# Patient Record
Sex: Female | Born: 1975 | Race: Black or African American | Hispanic: No | Marital: Married | Smoking: Never smoker
Health system: Southern US, Community
[De-identification: ages and names within clinical notes are randomized; demographics above are authoritative.]

## PROBLEM LIST (undated history)

## (undated) DIAGNOSIS — I10 Essential (primary) hypertension: Secondary | ICD-10-CM

## (undated) DIAGNOSIS — M1990 Unspecified osteoarthritis, unspecified site: Secondary | ICD-10-CM

## (undated) HISTORY — PX: TUBAL LIGATION: SHX77

## (undated) HISTORY — PX: ABDOMINAL HYSTERECTOMY: SHX81

## (undated) HISTORY — DX: Morbid (severe) obesity due to excess calories: E66.01

---

## 2003-04-27 ENCOUNTER — Emergency Department (HOSPITAL_COMMUNITY): Admission: EM | Admit: 2003-04-27 | Discharge: 2003-04-28 | Payer: Self-pay | Admitting: Emergency Medicine

## 2006-04-17 ENCOUNTER — Encounter: Admission: RE | Admit: 2006-04-17 | Discharge: 2006-04-17 | Payer: Self-pay | Admitting: Internal Medicine

## 2010-11-12 ENCOUNTER — Emergency Department (HOSPITAL_COMMUNITY)
Admission: EM | Admit: 2010-11-12 | Discharge: 2010-11-12 | Disposition: A | Discharge status: Home / Self Care | Payer: Managed Care, Other (non HMO) | Attending: Emergency Medicine | Admitting: Emergency Medicine

## 2010-11-12 DIAGNOSIS — M65839 Other synovitis and tenosynovitis, unspecified forearm: Secondary | ICD-10-CM | POA: Insufficient documentation

## 2010-11-12 DIAGNOSIS — M25439 Effusion, unspecified wrist: Secondary | ICD-10-CM | POA: Insufficient documentation

## 2010-11-12 DIAGNOSIS — M65849 Other synovitis and tenosynovitis, unspecified hand: Secondary | ICD-10-CM | POA: Insufficient documentation

## 2010-11-12 DIAGNOSIS — M79609 Pain in unspecified limb: Secondary | ICD-10-CM | POA: Insufficient documentation

## 2010-11-12 DIAGNOSIS — M25539 Pain in unspecified wrist: Secondary | ICD-10-CM | POA: Insufficient documentation

## 2010-11-12 DIAGNOSIS — M7989 Other specified soft tissue disorders: Secondary | ICD-10-CM | POA: Insufficient documentation

## 2011-03-16 ENCOUNTER — Emergency Department (HOSPITAL_COMMUNITY)
Admission: EM | Admit: 2011-03-16 | Discharge: 2011-03-17 | Disposition: A | Discharge status: Home / Self Care | Payer: Managed Care, Other (non HMO) | Attending: Emergency Medicine | Admitting: Emergency Medicine

## 2011-03-16 ENCOUNTER — Encounter (HOSPITAL_COMMUNITY): Payer: Self-pay | Admitting: *Deleted

## 2011-03-16 DIAGNOSIS — R059 Cough, unspecified: Secondary | ICD-10-CM | POA: Insufficient documentation

## 2011-03-16 DIAGNOSIS — J029 Acute pharyngitis, unspecified: Secondary | ICD-10-CM

## 2011-03-16 DIAGNOSIS — R05 Cough: Secondary | ICD-10-CM | POA: Insufficient documentation

## 2011-03-16 NOTE — ED Notes (Signed)
sorethroat for 3 days with chills

## 2011-03-17 MED ORDER — PREDNISONE 20 MG PO TABS
60.0000 mg | ORAL_TABLET | Freq: Once | ORAL | Status: AC
  Administered 2011-03-17: 60 mg via ORAL
  Filled 2011-03-17: qty 3

## 2011-03-17 NOTE — ED Notes (Signed)
C/O sore throat x 3 days.

## 2011-03-17 NOTE — ED Provider Notes (Signed)
History     CSN: [id number]  Arrival date & time 03/16/11  2337   First MD Initiated Contact with Patient 03/17/11 0201      Chief Complaint  Patient presents with  . Sore Throat     Patient is a 36 y.o. female presenting with pharyngitis. The history is provided by the patient.  Sore Throat This is a new problem. The current episode started more than 2 days ago. The problem occurs constantly. The problem has been gradually worsening. Pertinent negatives include no chest pain and no shortness of breath. The symptoms are aggravated by swallowing. The symptoms are relieved by nothing.  pt with sore throat/hoarse voice for 2-3 days Also reports recent non-productive cough She is able to swallow liquids No SOB reported  PMH - none  History reviewed. No pertinent past surgical history.  History reviewed. No pertinent family history.  History  Substance Use Topics  . Smoking status: Never Smoker   . Smokeless tobacco: Not on file  . Alcohol Use: Yes    OB History    Grav Para Term Preterm Abortions TAB SAB Ect Mult Living                  Review of Systems  Respiratory: Negative for shortness of breath.   Cardiovascular: Negative for chest pain.    Allergies  Review of patient's allergies indicates no known allergies.  Home Medications   Current Outpatient Rx  Name Route Sig Dispense Refill  . PHENTERMINE HCL 15 MG PO CAPS Oral Take 15 mg by mouth at bedtime.      BP 113/78  Pulse 68  Temp(Src) 97.6 F (36.4 C) (Oral)  Resp 20  SpO2 98%  LMP 03/13/2011  Physical Exam CONSTITUTIONAL: Well developed/well nourished HEAD AND FACE: Normocephalic/atraumatic EYES: EOMI/PERRL ENMT: Mucous membranes moist, uvula midline, no exudates noted.  Erythema noted to oropharynx No cervical lymphadenopathy.  Raspy voice noted.  No stridor noted NECK: supple no meningeal signs SPINE:entire spine nontender CV: S1/S2 noted, no murmurs/rubs/gallops noted LUNGS: Lungs are  clear to auscultation bilaterally, no apparent distress ABDOMEN: soft, nontender, no rebound or guarding GU:no cva tenderness NEURO: Pt is awake/alert, moves all extremitiesx4 EXTREMITIES: pulses normal, full ROM SKIN: warm, color normal PSYCH: no abnormalities of mood noted  ED Course  Procedures    Labs Reviewed  RAPID STREP SCREEN     1. Pharyngitis    Pt well appearing, no distress, stable for d/c She does not want work note or pain meds The patient appears reasonably screened and/or stabilized for discharge and I doubt any other medical condition or other [facility] requiring further screening, evaluation, or treatment in the ED at this time prior to discharge.    MDM  Nursing notes reviewed and considered in documentation All labs/vitals reviewed and considered         Joya Gaskins, MD 03/17/11 0330

## 2011-04-07 ENCOUNTER — Ambulatory Visit (INDEPENDENT_AMBULATORY_CARE_PROVIDER_SITE_OTHER): Payer: Managed Care, Other (non HMO) | Admitting: General Surgery

## 2011-04-27 ENCOUNTER — Ambulatory Visit (INDEPENDENT_AMBULATORY_CARE_PROVIDER_SITE_OTHER): Payer: Managed Care, Other (non HMO) | Admitting: General Surgery

## 2011-04-27 ENCOUNTER — Encounter (INDEPENDENT_AMBULATORY_CARE_PROVIDER_SITE_OTHER): Payer: Self-pay | Admitting: General Surgery

## 2011-04-27 VITALS — BP 112/70 | HR 76 | Temp 97.7°F | Resp 16 | Ht 62.0 in | Wt 262.2 lb

## 2011-04-27 DIAGNOSIS — E66813 Obesity, class 3: Secondary | ICD-10-CM

## 2011-04-27 DIAGNOSIS — E6609 Other obesity due to excess calories: Secondary | ICD-10-CM | POA: Insufficient documentation

## 2011-04-27 HISTORY — DX: Morbid (severe) obesity due to excess calories: E66.01

## 2011-04-27 HISTORY — DX: Obesity, class 3: E66.813

## 2011-04-27 LAB — COMPREHENSIVE METABOLIC PANEL
ALT: 18 U/L (ref 0–35)
Albumin: 4.2 g/dL (ref 3.5–5.2)
CO2: 25 mEq/L (ref 19–32)
Calcium: 9.3 mg/dL (ref 8.4–10.5)
Chloride: 108 mEq/L (ref 96–112)
Creat: 0.68 mg/dL (ref 0.50–1.10)
Potassium: 4.4 mEq/L (ref 3.5–5.3)
Sodium: 140 mEq/L (ref 135–145)
Total Protein: 7.2 g/dL (ref 6.0–8.3)

## 2011-04-27 LAB — TSH: TSH: 0.992 u[IU]/mL (ref 0.350–4.500)

## 2011-04-27 LAB — LIPID PANEL
LDL Cholesterol: 60 mg/dL (ref 0–99)
VLDL: 7 mg/dL (ref 0–40)

## 2011-04-27 LAB — T4: T4, Total: 10.1 ug/dL (ref 5.0–12.5)

## 2011-04-27 NOTE — Progress Notes (Signed)
Patient ID: Janice Terrell, female   DOB: [birth year], 36 y.o.   MRN: [id number]  Chief Complaint  Patient presents with  . Weight Loss Surgery    HPI Janice Terrell is a 36 y.o. female.  HPI A 36 year old morbidly obese African American female referred by Dr Concepcion Elk  to discuss weight loss surgery. The patient is specifically interested in laparoscopic adjustable gastric band surgery. She likes the fact that it is reversible if necessary. She states that she has drug of all her adult life with her weight. Despite several different attempts for sustained weight loss she has been unsuccessful. She has done phentermine on 2 separate occasions. She is currently on phentermine now. She has lost 11 pounds most recently. She was on it several years ago and lost 30 pounds but subsequently regained it. She has also been placed on a low calorie diet in the past.   Past Medical History  Diagnosis Date  . Morbid obesity   . Obesity, Class III, BMI 40-49.9 (morbid obesity) 04/27/2011    Past Surgical History  Procedure Date  . Cesarean section 1998    No family history on file.  Social History History  Substance Use Topics  . Smoking status: Never Smoker   . Smokeless tobacco: Not on file  . Alcohol Use: Yes    No Known Allergies  Current Outpatient Prescriptions  Medication Sig Dispense Refill  . phentermine 15 MG capsule Take 15 mg by mouth at bedtime.        Review of Systems Review of Systems  Constitutional: Negative for fever, chills and unexpected weight change.  HENT: Negative for hearing loss, congestion, sore throat, trouble swallowing and voice change.   Eyes: Negative for visual disturbance.  Respiratory: Negative for cough and wheezing.        Some DOE after 2 flights of stairs  Cardiovascular: Negative for chest pain, palpitations and leg swelling.  Gastrointestinal: Negative for nausea, vomiting, abdominal pain, diarrhea, constipation, blood in stool, abdominal  distention and anal bleeding.  Genitourinary: Negative for hematuria, vaginal bleeding and difficulty urinating.       G2P2  Musculoskeletal: Negative for back pain, joint swelling and arthralgias.       +rt knee pain for 6 months- took an xray  Skin: Negative for rash and wound.  Neurological: Negative for seizures, syncope and headaches.  Hematological: Negative for adenopathy. Does not bruise/bleed easily.  Psychiatric/Behavioral: Negative for confusion.    Blood pressure 112/70, pulse 76, temperature 97.7 F (36.5 C), temperature source Temporal, resp. rate 16, height 5\' 2"  (1.575 m), weight 262 lb 4 oz (118.956 kg).  Physical Exam Physical Exam  Vitals reviewed. Constitutional: She is oriented to person, place, and time. She appears well-developed and well-nourished. No distress.       obese  HENT:  Head: Normocephalic and atraumatic.  Right Ear: External ear normal.  Left Ear: External ear normal.  Eyes: Conjunctivae are normal. No scleral icterus.  Neck: Normal range of motion. Neck supple. No JVD present. No tracheal deviation present. No thyromegaly present.  Cardiovascular: Normal rate, regular rhythm and normal heart sounds.   Pulmonary/Chest: Effort normal and breath sounds normal. No respiratory distress. She has no wheezes.  Abdominal: Soft. She exhibits no distension. There is no tenderness. There is no rebound.    Musculoskeletal: Normal range of motion. She exhibits no edema and no tenderness.  Lymphadenopathy:    She has no cervical adenopathy.  Neurological: She is alert and oriented  to person, place, and time. No cranial nerve deficit. She exhibits normal muscle tone.  Skin: Skin is warm and dry. No rash noted. She is not diaphoretic. No erythema.  Psychiatric: She has a normal mood and affect. Her behavior is normal. Thought content normal.    Data Reviewed Sleep questionaire  Assessment    Morbid obesity BMI 48 Right knee pain    Plan    She  meets weight loss surgery criteria based on her BMI.   We discussed laparoscopic adjustable gastric banding. The patient was given Agricultural engineer. We discussed the risk and benefits of surgery including but not limited to bleeding, infection, injury to surrounding structures, blood clot formation such as deep venous thrombosis or pulmonary embolism, need to convert to an open procedure, band slippage, band erosion, failure to loose weight, port complications (leak or flippage), potential need for reoperative surgery, esophageal dilatation, worsening reflux, and vitamin deficiencies. We discussed the typical post operative recovery course. We discussed that their postoperative diet will be modified for several weeks. We specifically talked about the need to be on a liquid diet for one to 2 weeks after surgery. We also discussed the typical postoperative course with a laparoscopic adjustable gastric band and the need for frequent postoperative visits to assess the volume status of the band.  We discussed the typical expected weight loss with a laparoscopic adjustable gastric band. I explained to the patient that they can expect to lose 35-60% of their excess body weight if they are compliant with their postoperative instructions. However I did explain that some patients loose less than 40% and some patients lose more than 60% of their excess body weight.  I explained that the likelihood of improvement in their obesity is good.  We will start our evaluation process,.  Mary Sella. Andrey Campanile, MD, FACS General, Bariatric, & Minimally Invasive Surgery [facility], [place]         [facility] M 04/27/2011, 5:15 PM

## 2011-04-28 LAB — CBC WITH DIFFERENTIAL/PLATELET
Eosinophils Relative: 2 % (ref 0–5)
Lymphocytes Relative: 39 % (ref 12–46)
Lymphs Abs: 3.1 10*3/uL (ref 0.7–4.0)
MCV: 81.4 fL (ref 78.0–100.0)
Neutro Abs: 4.1 10*3/uL (ref 1.7–7.7)
Platelets: 402 10*3/uL — ABNORMAL HIGH (ref 150–400)
RBC: 4.19 MIL/uL (ref 3.87–5.11)
WBC: 8 10*3/uL (ref 4.0–10.5)

## 2011-04-28 LAB — H. PYLORI ANTIBODY, IGG: H Pylori IgG: >8 {ISR} — ABNORMAL HIGH

## 2011-05-05 ENCOUNTER — Telehealth (INDEPENDENT_AMBULATORY_CARE_PROVIDER_SITE_OTHER): Payer: Self-pay | Admitting: General Surgery

## 2011-05-05 DIAGNOSIS — D649 Anemia, unspecified: Secondary | ICD-10-CM

## 2011-05-05 NOTE — Telephone Encounter (Signed)
05/05/11 spoke with pt. Adv patient of breath-tek appointment scheduled on 05/19/11 @ 8:15 am @ [facility].  Adv pt that Dr Andrey Campanile requested a breath-tek to be scheduled due to the h.pylori blood test being elevated on her lab work.   cef

## 2011-05-05 NOTE — Telephone Encounter (Signed)
Patient contacted, made aware her hemoglobin was low and we would be getting an IBC panel on patient. She will go to lab when she is available to do so. Lab order placed and faxed to [facility].

## 2011-05-05 NOTE — Telephone Encounter (Signed)
Message copied by Liliana Cline on Fri May 05, 2011  9:33 AM ------      Message from: Andrey Campanile, ERIC M      Created: Thu May 04, 2011  2:31 PM       Pt needs a h pylori breath test and an anemia panel (iron, ferritin, folate, B-12, %sat)

## 2011-05-06 ENCOUNTER — Encounter: Payer: Self-pay | Admitting: *Deleted

## 2011-05-06 ENCOUNTER — Encounter: Payer: Managed Care, Other (non HMO) | Attending: General Surgery | Admitting: *Deleted

## 2011-05-06 DIAGNOSIS — Z01818 Encounter for other preprocedural examination: Secondary | ICD-10-CM | POA: Insufficient documentation

## 2011-05-06 DIAGNOSIS — Z713 Dietary counseling and surveillance: Secondary | ICD-10-CM | POA: Insufficient documentation

## 2011-05-06 NOTE — Patient Instructions (Signed)
   Follow Pre-Op Nutrition Goals to prepare for Lapband Surgery.   Call the [facility] at [phone] once you have been given your surgery date to enrolled in the Pre-Op Nutrition Class. You will need to attend this nutrition class 3-4 weeks prior to your surgery. 

## 2011-05-06 NOTE — Progress Notes (Addendum)
  Pre-Op Assessment Visit:  Pre-Operative LAGB Surgery  Medical Nutrition Therapy:  Appt start time: 1600   End time: 1700.  Patient was seen on 05/06/2011 for Pre-Operative LAGB Nutrition Assessment. Assessment and letter of approval faxed to [facility] Bariatric Surgery Program coordinator on 05/08/11.  Approval letter sent to [facility] center and will be available in the chart under the media tab.  TANITA  BODY COMP RESULTS  05/06/11     %Fat 51.2%     FM (lbs) 133.5     FFM (lbs) 127.5     TBW (lbs) 93.5      Handouts given during visit include:  Pre-Op Goals   Bariatric Support Group Calendar  B.E.L.T. Program Flyer  Patient to call for Pre-Op and Post-Op Nutrition Education at the [facility] when surgery is scheduled.

## 2011-05-19 ENCOUNTER — Encounter (HOSPITAL_COMMUNITY)
Admission: RE | Disposition: A | Discharge status: Home / Self Care | Payer: Self-pay | Source: Ambulatory Visit | Attending: General Surgery

## 2011-05-19 ENCOUNTER — Ambulatory Visit (HOSPITAL_COMMUNITY)
Admission: RE | Admit: 2011-05-19 | Discharge: 2011-05-19 | Disposition: A | Discharge status: Home / Self Care | Payer: Managed Care, Other (non HMO) | Source: Ambulatory Visit | Attending: General Surgery | Admitting: General Surgery

## 2011-05-19 DIAGNOSIS — Z01818 Encounter for other preprocedural examination: Secondary | ICD-10-CM | POA: Insufficient documentation

## 2011-05-19 HISTORY — PX: BREATH TEK H PYLORI: SHX5422

## 2011-05-19 SURGERY — BREATH TEST, FOR HELICOBACTER PYLORI

## 2011-05-22 ENCOUNTER — Encounter (HOSPITAL_COMMUNITY): Payer: Self-pay | Admitting: Surgery

## 2011-05-29 ENCOUNTER — Ambulatory Visit (HOSPITAL_COMMUNITY): Admission: RE | Admit: 2011-05-29 | Payer: Managed Care, Other (non HMO) | Source: Ambulatory Visit

## 2011-05-29 ENCOUNTER — Ambulatory Visit (HOSPITAL_COMMUNITY): Payer: Managed Care, Other (non HMO) | Attending: General Surgery

## 2012-10-04 ENCOUNTER — Emergency Department (INDEPENDENT_AMBULATORY_CARE_PROVIDER_SITE_OTHER)
Admission: EM | Admit: 2012-10-04 | Discharge: 2012-10-04 | Disposition: A | Discharge status: Home / Self Care | Payer: Managed Care, Other (non HMO) | Source: Home / Self Care | Attending: Emergency Medicine | Admitting: Emergency Medicine

## 2012-10-04 ENCOUNTER — Encounter (HOSPITAL_COMMUNITY): Payer: Self-pay | Admitting: Emergency Medicine

## 2012-10-04 DIAGNOSIS — M722 Plantar fascial fibromatosis: Secondary | ICD-10-CM

## 2012-10-04 MED ORDER — MELOXICAM 15 MG PO TABS: 15.0000 mg | ORAL_TABLET | Freq: Every day | ORAL | Status: DC

## 2012-10-04 NOTE — ED Provider Notes (Signed)
Chief Complaint:   Chief Complaint  Patient presents with  . Foot Pain    History of Present Illness:   Janice Terrell is a 37 year old female who has had a two-week history of pain in the plantar surface of her left heel. She denies any injury. It hurts first thing in the morning when she first gets up and after prolonged immobilization. Seems to get better with walking or stretching. There is no numbness or tingling.  Review of Systems:  Other than noted above, the patient denies any of the following symptoms: Systemic:  No fevers, chills, or sweats.  No fatigue or tiredness. Musculoskeletal:  No joint pain, arthritis, bursitis, swelling, or back pain.  Neurological:  No muscular weakness, paresthesias.  PMFSH:  Past medical history, family history, social history, meds, and allergies were reviewed.  No history of gout.   Has a history of morbid obesity.  Physical Exam:   Vital signs:  BP 129/79  Pulse 77  Temp(Src) 98.9 F (37.2 C) (Oral)  Resp 20  SpO2 98% Gen:  Alert and oriented times 3.  In no distress. Musculoskeletal:  Exam of the foot reveals there is pain to palpation over insertion of the plantar fascia. No swelling. Also some pain to palpation in the arch of the foot. No pain to palpation of the ankle or dorsum of the foot.  Otherwise, all joints had a full a ROM with no swelling, bruising or deformity.  No edema, pulses full. Extremities were warm and pink.  Capillary refill was brisk.  Skin:  Clear, warm and dry.  No rash. Neuro:  Alert and oriented times 3.  Muscle strength was normal.  Sensation was intact to light touch.   Assessment:  The encounter diagnosis was Plantar fasciitis.  Plan:   1.  Meds:  The following meds were prescribed:   Discharge Medication List as of 10/04/2012  2:04 PM    START taking these medications   Details  meloxicam (MOBIC) 15 MG tablet Take 1 tablet (15 mg total) by mouth daily., Starting 10/04/2012, Until Discontinued, Normal         2.  Patient Education/Counseling:  The patient was given appropriate handouts, self care instructions, and instructed in symptomatic relief including rest and activity, elevation, application of ice and compression.  Suggested stretching exercises, ice, weight loss, shoe insert, and if no better, followup with podiatry.  3.  Follow up:  The patient was told to follow up if no better in 3 to 4 days, if becoming worse in any way, and given some red flag symptoms such as worsening pain which would prompt immediate return.  Follow up with Dr. Cristie Hem as needed.       Reuben Likes, MD 10/04/12 [phone]

## 2012-10-04 NOTE — ED Notes (Signed)
Foot pain, seen by MD only

## 2013-11-09 ENCOUNTER — Emergency Department (HOSPITAL_COMMUNITY): Payer: Managed Care, Other (non HMO)

## 2013-11-09 ENCOUNTER — Emergency Department (HOSPITAL_COMMUNITY)
Admission: EM | Admit: 2013-11-09 | Discharge: 2013-11-09 | Disposition: A | Discharge status: Home / Self Care | Payer: Managed Care, Other (non HMO) | Attending: Emergency Medicine | Admitting: Emergency Medicine

## 2013-11-09 ENCOUNTER — Encounter (HOSPITAL_COMMUNITY): Payer: Self-pay | Admitting: Emergency Medicine

## 2013-11-09 DIAGNOSIS — M545 Low back pain, unspecified: Secondary | ICD-10-CM

## 2013-11-09 DIAGNOSIS — Z3202 Encounter for pregnancy test, result negative: Secondary | ICD-10-CM | POA: Diagnosis not present

## 2013-11-09 DIAGNOSIS — R0600 Dyspnea, unspecified: Secondary | ICD-10-CM | POA: Diagnosis not present

## 2013-11-09 LAB — CBC
HCT: 32.7 % — ABNORMAL LOW (ref 36.0–46.0)
Hemoglobin: 10.3 g/dL — ABNORMAL LOW (ref 12.0–15.0)
MCH: 24.1 pg — ABNORMAL LOW (ref 26.0–34.0)
MCHC: 31.5 g/dL (ref 30.0–36.0)
MCV: 76.4 fL — ABNORMAL LOW (ref 78.0–100.0)
Platelets: 371 10*3/uL (ref 150–400)
RBC: 4.28 MIL/uL (ref 3.87–5.11)
RDW: 16.1 % — ABNORMAL HIGH (ref 11.5–15.5)
WBC: 6.6 10*3/uL (ref 4.0–10.5)

## 2013-11-09 LAB — BASIC METABOLIC PANEL
Anion gap: 12 (ref 5–15)
BUN: 11 mg/dL (ref 6–23)
CO2: 22 mEq/L (ref 19–32)
Calcium: 9 mg/dL (ref 8.4–10.5)
Chloride: 104 mEq/L (ref 96–112)
Creatinine, Ser: 0.62 mg/dL (ref 0.50–1.10)
GFR calc Af Amer: >90 mL/min (ref >90)
GFR calc non Af Amer: >90 mL/min (ref >90)
Glucose, Bld: 93 mg/dL (ref 70–99)
Potassium: 4 mEq/L (ref 3.7–5.3)
Sodium: 138 mEq/L (ref 137–147)

## 2013-11-09 LAB — D-DIMER, QUANTITATIVE: D-Dimer, Quant: 0.27 ug/mL-FEU (ref 0.00–0.48)

## 2013-11-09 LAB — URINALYSIS, ROUTINE W REFLEX MICROSCOPIC
BILIRUBIN URINE: NEGATIVE
Glucose, UA: NEGATIVE mg/dL
HGB URINE DIPSTICK: NEGATIVE
Ketones, ur: NEGATIVE mg/dL
Leukocytes, UA: NEGATIVE
Nitrite: NEGATIVE
PROTEIN: NEGATIVE mg/dL
Specific Gravity, Urine: 1.015 (ref 1.005–1.030)
Urobilinogen, UA: 0.2 mg/dL (ref 0.0–1.0)
pH: 6.5 (ref 5.0–8.0)

## 2013-11-09 LAB — PREGNANCY, URINE: PREG TEST UR: NEGATIVE

## 2013-11-09 LAB — I-STAT TROPONIN, ED: Troponin i, poc: 0 ng/mL (ref 0.00–0.08)

## 2013-11-09 LAB — PRO B NATRIURETIC PEPTIDE: PRO B NATRI PEPTIDE: 91.3 pg/mL (ref 0–125)

## 2013-11-09 MED ORDER — HYDROCODONE-ACETAMINOPHEN 5-325 MG PO TABS
1.0000 | ORAL_TABLET | Freq: Once | ORAL | Status: AC
  Administered 2013-11-09: 1 via ORAL
  Filled 2013-11-09: qty 1

## 2013-11-09 MED ORDER — HYDROCODONE-ACETAMINOPHEN 5-325 MG PO TABS: 2.0000 | ORAL_TABLET | ORAL | Status: DC | PRN

## 2013-11-09 MED ORDER — IBUPROFEN 800 MG PO TABS: 800.0000 mg | ORAL_TABLET | Freq: Three times a day (TID) | ORAL | Status: DC

## 2013-11-09 NOTE — ED Notes (Signed)
Patient transported to X-ray 

## 2013-11-09 NOTE — ED Notes (Signed)
Pt states she started having lower back pain 3 nights ago and over the last couple nights she has SOB when lying down. No known injury. Denies any cp.

## 2013-11-09 NOTE — ED Provider Notes (Signed)
CSN: [id number]     Arrival date & time 11/09/13  1107 History   First MD Initiated Contact with Patient 11/09/13 1158     Chief Complaint  Patient presents with  . Shortness of Breath  . Back Pain     (Consider location/radiation/quality/duration/timing/severity/associated sxs/prior Treatment) HPI Comments: Patient multiple complaints. Complains of low back pain onset 3 nights ago without any injury. Pain is constant and radiates across her low back not down her legs. No weakness, numbness or tingling. No bowel bladder incontinence. No fever or vomiting.NO IVDA. no History of cancer. Denies any lifting injury.  Also complains of shortness of breath worse with lying down over the past 3 days. No cough, chest pain, fever No leg pain leg swelling.  She also has some tingling in her right hand along all of her fingers intermittent for the past several days. Denies any excessive use of the hand. Denies any weakness. Denies any numbness extending beyond the wrist. Denies any numbness in her other extremities.  The history is provided by the patient.    Past Medical History  Diagnosis Date  . Obesity, Class III, BMI 40-49.9 (morbid obesity) 04/27/2011   Past Surgical History  Procedure Laterality Date  . Cesarean section  1998  . Tubal ligation    . Breath tek h pylori  05/19/2011    Procedure: BREATH TEK H PYLORI;  Surgeon: Pedro Earls, MD;  Location: [facility];  Service: General;  Laterality: N/A;   Family History  Problem Relation Age of Onset  . Diabetes Sister   . Diabetes Maternal Grandmother   . Diabetes Other    History  Substance Use Topics  . Smoking status: Never Smoker   . Smokeless tobacco: Not on file  . Alcohol Use: No   OB History   Grav Para Term Preterm Abortions TAB SAB Ect Mult Living                 Review of Systems  Constitutional: Negative for fever, activity change and appetite change.  HENT: Negative for congestion and rhinorrhea.    Respiratory: Positive for shortness of breath. Negative for cough.   Cardiovascular: Negative for chest pain.  Gastrointestinal: Negative for nausea, vomiting and abdominal pain.  Genitourinary: Negative for dysuria and hematuria.  Musculoskeletal: Positive for back pain. Negative for arthralgias and myalgias.  Skin: Negative for rash.  Neurological: Negative for facial asymmetry, weakness and light-headedness.  A complete 10 system review of systems was obtained and all systems are negative except as noted in the HPI and PMH.      Allergies  Review of patient's allergies indicates no known allergies.  Home Medications   Prior to Admission medications   Medication Sig Start Date End Date Taking? Authorizing Provider  HYDROcodone-acetaminophen (NORCO/VICODIN) 5-325 MG per tablet Take 2 tablets by mouth every 4 (four) hours as needed. 11/09/13   Ezequiel Essex, MD  ibuprofen (ADVIL,MOTRIN) 800 MG tablet Take 1 tablet (800 mg total) by mouth 3 (three) times daily. 11/09/13   Ezequiel Essex, MD   BP 126/77  Pulse 58  Resp 21  SpO2 100%  LMP 10/28/2013 Physical Exam  Nursing note and vitals reviewed. Constitutional: She is oriented to person, place, and time. She appears well-developed and well-nourished. No distress.  HENT:  Head: Normocephalic and atraumatic.  Mouth/Throat: Oropharynx is clear and moist. No oropharyngeal exudate.  Eyes: Conjunctivae and EOM are normal. Pupils are equal, round, and reactive to light.  Neck: Normal range  of motion. Neck supple.  No meningismus.  Cardiovascular: Normal rate, regular rhythm, normal heart sounds and intact distal pulses.   No murmur heard. Pulmonary/Chest: Effort normal and breath sounds normal. No respiratory distress.  Abdominal: Soft. There is no tenderness. There is no rebound and no guarding.  Musculoskeletal: Normal range of motion. She exhibits tenderness. She exhibits no edema.  Paraspinal low back pain 5/5 strength in  bilateral lower extremities. Ankle plantar and dorsiflexion intact. Great toe extension intact bilaterally. +2 DP and PT pulses. +2 patellar reflexes bilaterally. Normal gait.  +tingling in R fingers with Tinel's test  Neurological: She is alert and oriented to person, place, and time. No cranial nerve deficit. She exhibits normal muscle tone. Coordination normal.  No ataxia on finger to nose bilaterally. No pronator drift. 5/5 strength throughout. CN 2-12 intact. Negative Romberg. Equal grip strength. Sensation intact. Gait is normal.   Skin: Skin is warm.  Psychiatric: She has a normal mood and affect. Her behavior is normal.    ED Course  Procedures (including critical care time) Labs Review Labs Reviewed  CBC - Abnormal; Notable for the following:    Hemoglobin 10.3 (*)    HCT 32.7 (*)    MCV 76.4 (*)    MCH 24.1 (*)    RDW 16.1 (*)    All other components within normal limits  BASIC METABOLIC PANEL  PRO B NATRIURETIC PEPTIDE  D-DIMER, QUANTITATIVE  URINALYSIS, ROUTINE W REFLEX MICROSCOPIC  PREGNANCY, URINE  I-STAT TROPOININ, ED    Imaging Review Dg Chest 2 View  11/09/2013   CLINICAL DATA:  Shortness of breath.  Back pain.  EXAM: CHEST  2 VIEW  COMPARISON:  04/28/2003  FINDINGS: The heart size and mediastinal contours are within normal limits. Both lungs are clear. The visualized skeletal structures are unremarkable.  IMPRESSION: No active cardiopulmonary disease.   Electronically Signed   By: Earle Gell M.D.   On: 11/09/2013 13:01     EKG Interpretation None      MDM   Final diagnoses:  Midline low back pain without sciatica  Dyspnea  3 days of low back pain, some SOB. No chest pain or fever. EKG nsr.  Workup unremarkable. Chest x-ray negative. D-dimer negative. Urinalysis negative. Hemoglobin stable.   No evidence of cord compression or cauda equina.  Patient states SOB seems to be worse with laying down and at night.  No evidence of CHF.   Advised follow up  with PCP for possible sleep apnea.  Ezequiel Essex, MD 11/09/13 [phone]

## 2013-11-09 NOTE — Discharge Instructions (Signed)
Back Pain, Adult Follow up with your doctor. As we discussed, you should be checked for sleep apnea. Return to the ED if you develop new or worsening symptoms. Low back pain is very common. About 1 in 5 people have back pain.The cause of low back pain is rarely dangerous. The pain often gets better over time.About half of people with a sudden onset of back pain feel better in just 2 weeks. About 8 in 10 people feel better by 6 weeks.  CAUSES Some common causes of back pain include:  Strain of the muscles or ligaments supporting the spine.  Wear and tear (degeneration) of the spinal discs.  Arthritis.  Direct injury to the back. DIAGNOSIS Most of the time, the direct cause of low back pain is not known.However, back pain can be treated effectively even when the exact cause of the pain is unknown.Answering your caregiver's questions about your overall health and symptoms is one of the most accurate ways to make sure the cause of your pain is not dangerous. If your caregiver needs more information, he or she may order lab work or imaging tests (X-rays or MRIs).However, even if imaging tests show changes in your back, this usually does not require surgery. HOME CARE INSTRUCTIONS For many people, back pain returns.Since low back pain is rarely dangerous, it is often a condition that people can learn to [facility] their own.   Remain active. It is stressful on the back to sit or stand in one place. Do not sit, drive, or stand in one place for more than 30 minutes at a time. Take short walks on level surfaces as soon as pain allows.Try to increase the length of time you walk each day.  Do not stay in bed.Resting more than 1 or 2 days can delay your recovery.  Do not avoid exercise or work.Your body is made to move.It is not dangerous to be active, even though your back may hurt.Your back will likely heal faster if you return to being active before your pain is gone.  Pay attention to your  body when you bend and lift. Many people have less discomfortwhen lifting if they bend their knees, keep the load close to their bodies,and avoid twisting. Often, the most comfortable positions are those that put less stress on your recovering back.  Find a comfortable position to sleep. Use a firm mattress and lie on your side with your knees slightly bent. If you lie on your back, put a pillow under your knees.  Only take over-the-counter or prescription medicines as directed by your caregiver. Over-the-counter medicines to reduce pain and inflammation are often the most helpful.Your caregiver may prescribe muscle relaxant drugs.These medicines help dull your pain so you can more quickly return to your normal activities and healthy exercise.  Put ice on the injured area.  Put ice in a plastic bag.  Place a towel between your skin and the bag.  Leave the ice on for 15-20 minutes, 03-04 times a day for the first 2 to 3 days. After that, ice and heat may be alternated to reduce pain and spasms.  Ask your caregiver about trying back exercises and gentle massage. This may be of some benefit.  Avoid feeling anxious or stressed.Stress increases muscle tension and can worsen back pain.It is important to recognize when you are anxious or stressed and learn ways to manage it.Exercise is a great option. SEEK MEDICAL CARE IF:  You have pain that is not relieved with rest  or medicine.  You have pain that does not improve in 1 week.  You have new symptoms.  You are generally not feeling well. SEEK IMMEDIATE MEDICAL CARE IF:   You have pain that radiates from your back into your legs.  You develop new bowel or bladder control problems.  You have unusual weakness or numbness in your arms or legs.  You develop nausea or vomiting.  You develop abdominal pain.  You feel faint. Document Released: 01/16/2005 Document Revised: 07/18/2011 Document Reviewed: 05/20/2013 [facility] Patient  Information 2015 [place], [place]. This information is not intended to replace advice given to you by your health care provider. Make sure you discuss any questions you have with your health care provider.

## 2014-02-03 ENCOUNTER — Other Ambulatory Visit: Payer: Self-pay

## 2014-02-03 DIAGNOSIS — Z1231 Encounter for screening mammogram for malignant neoplasm of breast: Secondary | ICD-10-CM

## 2014-02-10 ENCOUNTER — Ambulatory Visit
Admission: RE | Admit: 2014-02-10 | Discharge: 2014-02-10 | Disposition: A | Discharge status: Home / Self Care | Payer: Managed Care, Other (non HMO) | Source: Ambulatory Visit

## 2014-02-10 DIAGNOSIS — Z1231 Encounter for screening mammogram for malignant neoplasm of breast: Secondary | ICD-10-CM

## 2014-08-16 ENCOUNTER — Emergency Department (INDEPENDENT_AMBULATORY_CARE_PROVIDER_SITE_OTHER)
Admission: EM | Admit: 2014-08-16 | Discharge: 2014-08-16 | Disposition: A | Discharge status: Home / Self Care | Payer: Managed Care, Other (non HMO) | Source: Home / Self Care | Attending: Family Medicine | Admitting: Family Medicine

## 2014-08-16 ENCOUNTER — Encounter (HOSPITAL_COMMUNITY): Payer: Self-pay | Admitting: Emergency Medicine

## 2014-08-16 DIAGNOSIS — M25562 Pain in left knee: Secondary | ICD-10-CM

## 2014-08-16 DIAGNOSIS — T148 Other injury of unspecified body region: Secondary | ICD-10-CM | POA: Diagnosis not present

## 2014-08-16 DIAGNOSIS — M79602 Pain in left arm: Secondary | ICD-10-CM

## 2014-08-16 DIAGNOSIS — W19XXXA Unspecified fall, initial encounter: Secondary | ICD-10-CM

## 2014-08-16 DIAGNOSIS — T148XXA Other injury of unspecified body region, initial encounter: Secondary | ICD-10-CM

## 2014-08-16 NOTE — ED Provider Notes (Signed)
CSN: [id number]     Arrival date & time 08/16/14  1746 History   First MD Initiated Contact with Patient 08/16/14 1806     Chief Complaint  Patient presents with  . Fall   (Consider location/radiation/quality/duration/timing/severity/associated sxs/prior Treatment) HPI Comments: Janice Terrell is a 39 yo female that presents with left elbow and left knee pain. She fell up the stairs while running from a dog. Her pain is along the left upper forearm with mild swelling and scrapes. The left knee is milder and she can bear weight without problems. Wanted to get them checked out.   Patient is a 39 y.o. female presenting with fall. The history is provided by the patient.  Fall    Past Medical History  Diagnosis Date  . Obesity, Class III, BMI 40-49.9 (morbid obesity) 04/27/2011   Past Surgical History  Procedure Laterality Date  . Cesarean section  1998  . Tubal ligation    . Breath tek h pylori  05/19/2011    Procedure: BREATH TEK H PYLORI;  Surgeon: Pedro Earls, MD;  Location: [facility];  Service: General;  Laterality: N/A;   Family History  Problem Relation Age of Onset  . Diabetes Sister   . Diabetes Maternal Grandmother   . Diabetes Other    History  Substance Use Topics  . Smoking status: Never Smoker   . Smokeless tobacco: Not on file  . Alcohol Use: No   OB History    No data available     Review of Systems  All other systems reviewed and are negative.   Allergies  Review of patient's allergies indicates no known allergies.  Home Medications   Prior to Admission medications   Medication Sig Start Date End Date Taking? Authorizing Provider  HYDROcodone-acetaminophen (NORCO/VICODIN) 5-325 MG per tablet Take 2 tablets by mouth every 4 (four) hours as needed. 11/09/13   Ezequiel Essex, MD  ibuprofen (ADVIL,MOTRIN) 800 MG tablet Take 1 tablet (800 mg total) by mouth 3 (three) times daily. 11/09/13   Ezequiel Essex, MD   BP 117/54 mmHg  Pulse 71  Temp(Src)  98.3 F (36.8 C) (Oral)  Resp 22  SpO2 100%  LMP 08/09/2014 Physical Exam  Constitutional: She appears well-developed and well-nourished. No distress.  Pulmonary/Chest: Effort normal.  Musculoskeletal:  Left elbow with full ROM and no pain to actual joint. Just inferior to this a small hematoma and scratches; mild tenderness. Ability to supinate and pronate with pain to the forearm. Left knee with full ROM and ability to weight bear. Mild scratch to left upper thigh.   Skin: Skin is warm and dry. She is not diaphoretic.  Psychiatric: Her behavior is normal.  Nursing note and vitals reviewed.   ED Course  Procedures (including critical care time) Labs Review Labs Reviewed - No data to display  Imaging Review No results found.   MDM   1. Contusion of soft tissue   2. Fall, initial encounter   3. Pain of left upper extremity   4. Knee pain, acute, left    Appear to be soft tissue injuries only. No indication for xrays today. Treat symptomatically. F/U if worsens.     Bjorn Pippin, PA-C 08/16/14 [phone]

## 2014-08-16 NOTE — ED Notes (Signed)
Applied telfa and secured w/coban.

## 2014-08-16 NOTE — Discharge Instructions (Signed)
° ° ° ° ° ° ° °  Sorry about your fall. Luckily these areas appear to be soft tissue without injury to the joints! Keep clean. Ice and elevate. Use Ibuprofen 800mg  every 8 hours as needed. F/U with Ortho if problems.

## 2014-08-16 NOTE — ED Notes (Signed)
Pt reports she sustained a fall earlier today while trying to run away from a pitbull She went up some concrete stairs when she fell and scraped her left knee/elbow/and foot Left elbow is swollen Steady gait... No acute distress.

## 2014-10-12 ENCOUNTER — Other Ambulatory Visit: Payer: Self-pay | Admitting: Obstetrics and Gynecology

## 2014-11-03 ENCOUNTER — Encounter (HOSPITAL_COMMUNITY)
Admission: RE | Admit: 2014-11-03 | Discharge: 2014-11-03 | Disposition: A | Discharge status: Home / Self Care | Payer: Managed Care, Other (non HMO) | Source: Ambulatory Visit | Attending: Obstetrics and Gynecology | Admitting: Obstetrics and Gynecology

## 2014-11-03 ENCOUNTER — Encounter (HOSPITAL_COMMUNITY): Payer: Self-pay

## 2014-11-03 ENCOUNTER — Other Ambulatory Visit (HOSPITAL_COMMUNITY): Payer: Self-pay | Admitting: Obstetrics and Gynecology

## 2014-11-03 DIAGNOSIS — Z01818 Encounter for other preprocedural examination: Secondary | ICD-10-CM | POA: Diagnosis present

## 2014-11-03 LAB — CBC
HCT: 26.4 % — ABNORMAL LOW (ref 36.0–46.0)
Hemoglobin: 8.1 g/dL — ABNORMAL LOW (ref 12.0–15.0)
MCH: 22.1 pg — ABNORMAL LOW (ref 26.0–34.0)
MCHC: 30.7 g/dL (ref 30.0–36.0)
MCV: 71.9 fL — AB (ref 78.0–100.0)
PLATELETS: 404 10*3/uL — AB (ref 150–400)
RBC: 3.67 MIL/uL — ABNORMAL LOW (ref 3.87–5.11)
RDW: 16.7 % — AB (ref 11.5–15.5)
WBC: 7.6 10*3/uL (ref 4.0–10.5)

## 2014-11-03 NOTE — Progress Notes (Signed)
LEFT VM FOR DR. DILLIARD AT OFFICE ABOUT HG 8.1

## 2014-11-03 NOTE — Patient Instructions (Addendum)
   Your procedure is scheduled on: OCT 18   Enter through the Main Entrance of [facility] at: [place] up the phone at the desk and dial [phone] and inform us of your arrival.  Please call this number if you have any problems the morning of surgery: [phone]  Remember: Do not eat food after midnight: OCT 17 Do not drink clear liquids after: 930AM  Do not wear jewelry, make-up, or FINGER nail polish No metal in your hair or on your body. Do not wear lotions, powders, perfumes.  You may wear deodorant.  Do not bring valuables to the hospital. Contacts, dentures or bridgework may not be worn into surgery.  Leave suitcase in the car. After Surgery it may be brought to your room. For patients being admitted to the hospital, checkout time is 11:00am the day of discharge.

## 2014-11-03 NOTE — H&P (Signed)
Janice Terrell is a 39 y.o.  female P: 2-0-0-2 presents for hysterectomy because of abnormal uterine bleeding.  For the past 6 months the patient has had 5 days of vaginal bleeding every 2 weeks during which time she changes a pad and tampon every 20-30 minutes.  She reports large clots and frequent soiling of clothes and linen.  Though she has cramping rated at 8/10 on a 10 point pain scale she is able to "take the edge off" with Ibuprofen 400 mg.  She denies any inter-menstrual bleeding, post coital bleeding, changes in bowel or bladder function.  Prior to the past 5 months she had a monthly 5 day period but only change her pad hourly.  A TSH and Prolactin were normal but her hemoglobin and hematocrit were 9.7/31.6.  An endometrial biopsy at that same time returned secretory pattern endometrium with no atypia or malignancy. Though the patient was made aware of medical management options for her symptoms,  she has opted for definitive therapy in the form of hysterectomy.   Past Medical History  OB History: G: 2  P: 2-0-0-2;   SVB 1991  (infant weighed 6 lbs.)  C-section 1998  GYN History: menarche: 39 YO    LMP: 10/27/14    Contracepton bilateral tubal ligation  The patient denies history of sexually transmitted disease.  Denies history of abnormal PAP smear.  Last PAP smear June 2016-normal  Medical History:   Surgical History: 1998  Tubal Sterilization (post partum)    2014  Left Foot Surgery Denies problems with anesthesia or history of blood transfusions  Family History: Negative  Social History: Married and employed by [employer];  Denies Tobacco use and occasionally consumes Alcohol    Medications:   Ibuprofen 400 mg prn               Iron  daily  No Known Allergies    Denies sensitivity to peanuts, shellfish, soy, latex or adhesives.   ROS: Denies corrective lenses, removable dental ware,  headache, vision changes, nasal congestion, dysphagia, tinnitus, dizziness, hoarseness, cough,   chest pain, shortness of breath, nausea, vomiting, diarrhea,constipation,  urinary frequency, urgency  dysuria, hematuria, vaginitis symptoms, pelvic pain, swelling of joints,easy bruising,  myalgias, arthralgias, skin rashes, unexplained weight loss and except as is mentioned in the history of present illness, patient's review of systems is otherwise negative.   Physical Exam  Bp: 136/80    P: 68     Temperature:  99 degrees F orally        Weight: 285 lbs.  Height: 5'5"   BMI: 52.2  Neck: supple without masses or thyromegaly Lungs: clear to auscultation Heart: regular rate and rhythm Abdomen: soft, non-tender and no organomegaly Pelvic:EGBUS- wnl; vagina-normal rugae; uterus-appears normal size, though exam limited by habitus, cervix without lesions or motion tenderness; adnexae-no tenderness or masses Extremities:  no clubbing, cyanosis or edema   Assesment: Dysfunctional Uterine Bleeding   Disposition:  A discussion was held with patient regarding the indication for her procedure(s) along with the risks, which include but are not limited to: reaction to anesthesia, damage to adjacent organs, infection and excessive bleeding.  The patient verbalized understanding of these risks and has consented to proceed with a Laparoscopically Assisted Vaginal Hysterectomy with Bilateral Salpingectomy at [place] on November 17, 2014.   CSN# [id number]   Asani Mcburney J. Florene Glen, PA-C  for Dr. Franklyn Lor. Dillard

## 2014-11-11 ENCOUNTER — Other Ambulatory Visit: Payer: Self-pay | Admitting: Obstetrics and Gynecology

## 2014-11-16 MED ORDER — DEXTROSE 5 % IV SOLN
3.0000 g | INTRAVENOUS | Status: AC
  Administered 2014-11-17: 2 g via INTRAVENOUS
  Administered 2014-11-17: 1 g via INTRAVENOUS
  Filled 2014-11-16: qty 3000

## 2014-11-17 ENCOUNTER — Ambulatory Visit (HOSPITAL_COMMUNITY): Payer: Managed Care, Other (non HMO) | Admitting: Anesthesiology

## 2014-11-17 ENCOUNTER — Encounter (HOSPITAL_COMMUNITY): Payer: Self-pay | Admitting: *Deleted

## 2014-11-17 ENCOUNTER — Encounter (HOSPITAL_COMMUNITY)
Admission: RE | Disposition: A | Discharge status: Home / Self Care | Payer: Self-pay | Source: Ambulatory Visit | Attending: Obstetrics and Gynecology

## 2014-11-17 ENCOUNTER — Observation Stay (HOSPITAL_COMMUNITY)
Admission: RE | Admit: 2014-11-17 | Discharge: 2014-11-19 | Disposition: A | Discharge status: Home / Self Care | Payer: Managed Care, Other (non HMO) | Source: Ambulatory Visit | Attending: Obstetrics and Gynecology | Admitting: Obstetrics and Gynecology

## 2014-11-17 DIAGNOSIS — Z6841 Body Mass Index (BMI) 40.0 and over, adult: Secondary | ICD-10-CM | POA: Insufficient documentation

## 2014-11-17 DIAGNOSIS — N920 Excessive and frequent menstruation with regular cycle: Principal | ICD-10-CM | POA: Insufficient documentation

## 2014-11-17 DIAGNOSIS — D649 Anemia, unspecified: Secondary | ICD-10-CM | POA: Insufficient documentation

## 2014-11-17 DIAGNOSIS — N938 Other specified abnormal uterine and vaginal bleeding: Secondary | ICD-10-CM | POA: Diagnosis not present

## 2014-11-17 DIAGNOSIS — N926 Irregular menstruation, unspecified: Secondary | ICD-10-CM | POA: Diagnosis present

## 2014-11-17 HISTORY — PX: CYSTOSCOPY: SHX5120

## 2014-11-17 LAB — PREPARE RBC (CROSSMATCH)

## 2014-11-17 LAB — ABO/RH: ABO/RH(D): O NEG

## 2014-11-17 SURGERY — HYSTERECTOMY, VAGINAL, LAPAROSCOPY-ASSISTED, WITH SALPINGECTOMY
Anesthesia: General | Laterality: Bilateral

## 2014-11-17 MED ORDER — GLYCOPYRROLATE 0.2 MG/ML IJ SOLN
INTRAMUSCULAR | Status: DC | PRN
  Administered 2014-11-17: 0.2 mg via INTRAVENOUS
  Administered 2014-11-17: 0.3 mg via INTRAVENOUS

## 2014-11-17 MED ORDER — OXYCODONE HCL 5 MG/5ML PO SOLN: 5.0000 mg | Freq: Once | ORAL | Status: DC | PRN

## 2014-11-17 MED ORDER — DIPHENHYDRAMINE HCL 12.5 MG/5ML PO ELIX: 12.5000 mg | ORAL_SOLUTION | Freq: Four times a day (QID) | ORAL | Status: DC | PRN

## 2014-11-17 MED ORDER — ONDANSETRON HCL 4 MG/2ML IJ SOLN
INTRAMUSCULAR | Status: DC | PRN
  Administered 2014-11-17: 4 mg via INTRAVENOUS

## 2014-11-17 MED ORDER — FENTANYL CITRATE (PF) 100 MCG/2ML IJ SOLN
INTRAMUSCULAR | Status: DC | PRN
  Administered 2014-11-17: 25 ug via INTRAVENOUS
  Administered 2014-11-17: 50 ug via INTRAVENOUS
  Administered 2014-11-17: 100 ug via INTRAVENOUS
  Administered 2014-11-17: 25 ug via INTRAVENOUS
  Administered 2014-11-17: 100 ug via INTRAVENOUS
  Administered 2014-11-17 (×2): 50 ug via INTRAVENOUS

## 2014-11-17 MED ORDER — METHYLENE BLUE 1 % INJ SOLN
INTRAMUSCULAR | Status: DC | PRN
  Administered 2014-11-17 (×5): 10 mg via INTRAVENOUS

## 2014-11-17 MED ORDER — LIDOCAINE-EPINEPHRINE 0.5 %-1:200000 IJ SOLN
INTRAMUSCULAR | Status: AC
  Filled 2014-11-17: qty 1

## 2014-11-17 MED ORDER — ONDANSETRON HCL 4 MG/2ML IJ SOLN
INTRAMUSCULAR | Status: AC
  Filled 2014-11-17: qty 2

## 2014-11-17 MED ORDER — LACTATED RINGERS IR SOLN
Status: DC | PRN
  Administered 2014-11-17: 3000 mL

## 2014-11-17 MED ORDER — METHYLENE BLUE 1 % INJ SOLN
INTRAMUSCULAR | Status: AC
  Filled 2014-11-17: qty 10

## 2014-11-17 MED ORDER — OXYCODONE HCL 5 MG PO TABS: 5.0000 mg | ORAL_TABLET | Freq: Once | ORAL | Status: DC | PRN

## 2014-11-17 MED ORDER — BUPIVACAINE HCL (PF) 0.25 % IJ SOLN
INTRAMUSCULAR | Status: DC | PRN
  Administered 2014-11-17: 7 mL

## 2014-11-17 MED ORDER — ONDANSETRON HCL 4 MG PO TABS: 4.0000 mg | ORAL_TABLET | Freq: Three times a day (TID) | ORAL | Status: DC | PRN

## 2014-11-17 MED ORDER — FENTANYL CITRATE (PF) 100 MCG/2ML IJ SOLN
INTRAMUSCULAR | Status: AC
Start: 2014-11-17 — End: 2014-11-17
  Filled 2014-11-17: qty 4

## 2014-11-17 MED ORDER — SODIUM CHLORIDE 0.9 % IJ SOLN
INTRAMUSCULAR | Status: AC
  Filled 2014-11-17: qty 50

## 2014-11-17 MED ORDER — OXYCODONE-ACETAMINOPHEN 5-325 MG PO TABS
1.0000 | ORAL_TABLET | ORAL | Status: DC | PRN
  Administered 2014-11-18: 2 via ORAL
  Administered 2014-11-18: 1 via ORAL
  Administered 2014-11-18: 2 via ORAL
  Administered 2014-11-19: 1 via ORAL
  Filled 2014-11-17: qty 2
  Filled 2014-11-17: qty 1
  Filled 2014-11-17: qty 2
  Filled 2014-11-17: qty 1

## 2014-11-17 MED ORDER — BUPIVACAINE HCL (PF) 0.25 % IJ SOLN
INTRAMUSCULAR | Status: AC
  Filled 2014-11-17: qty 30

## 2014-11-17 MED ORDER — MIDAZOLAM HCL 2 MG/2ML IJ SOLN
INTRAMUSCULAR | Status: DC | PRN
  Administered 2014-11-17: 2 mg via INTRAVENOUS

## 2014-11-17 MED ORDER — FENTANYL CITRATE (PF) 100 MCG/2ML IJ SOLN
INTRAMUSCULAR | Status: AC
  Filled 2014-11-17: qty 4

## 2014-11-17 MED ORDER — ROCURONIUM BROMIDE 100 MG/10ML IV SOLN
INTRAVENOUS | Status: DC | PRN
  Administered 2014-11-17: 20 mg via INTRAVENOUS
  Administered 2014-11-17: 50 mg via INTRAVENOUS
  Administered 2014-11-17 (×4): 10 mg via INTRAVENOUS

## 2014-11-17 MED ORDER — DIPHENHYDRAMINE HCL 50 MG/ML IJ SOLN: 12.5000 mg | Freq: Four times a day (QID) | INTRAMUSCULAR | Status: DC | PRN

## 2014-11-17 MED ORDER — CEFAZOLIN SODIUM-DEXTROSE 2-3 GM-% IV SOLR
INTRAVENOUS | Status: AC
  Filled 2014-11-17: qty 50

## 2014-11-17 MED ORDER — KETOROLAC TROMETHAMINE 30 MG/ML IJ SOLN
INTRAMUSCULAR | Status: DC | PRN
  Administered 2014-11-17: 30 mg via INTRAVENOUS

## 2014-11-17 MED ORDER — ROCURONIUM BROMIDE 100 MG/10ML IV SOLN
INTRAVENOUS | Status: AC
  Filled 2014-11-17: qty 1

## 2014-11-17 MED ORDER — LACTATED RINGERS IV SOLN
INTRAVENOUS | Status: DC
  Administered 2014-11-17 – 2014-11-18 (×2): via INTRAVENOUS

## 2014-11-17 MED ORDER — ROCURONIUM BROMIDE 100 MG/10ML IV SOLN
INTRAVENOUS | Status: AC
Start: 2014-11-17 — End: 2014-11-17
  Filled 2014-11-17: qty 1

## 2014-11-17 MED ORDER — SCOPOLAMINE 1 MG/3DAYS TD PT72
1.0000 | MEDICATED_PATCH | Freq: Once | TRANSDERMAL | Status: DC
  Administered 2014-11-17: 1.5 mg via TRANSDERMAL

## 2014-11-17 MED ORDER — DEXAMETHASONE SODIUM PHOSPHATE 10 MG/ML IJ SOLN
INTRAMUSCULAR | Status: DC | PRN
  Administered 2014-11-17: 4 mg via INTRAVENOUS

## 2014-11-17 MED ORDER — KETOROLAC TROMETHAMINE 30 MG/ML IJ SOLN
INTRAMUSCULAR | Status: AC
  Filled 2014-11-17: qty 1

## 2014-11-17 MED ORDER — DEXAMETHASONE SODIUM PHOSPHATE 4 MG/ML IJ SOLN
INTRAMUSCULAR | Status: AC
  Filled 2014-11-17: qty 1

## 2014-11-17 MED ORDER — SCOPOLAMINE 1 MG/3DAYS TD PT72
MEDICATED_PATCH | TRANSDERMAL | Status: AC
  Administered 2014-11-17: 1.5 mg via TRANSDERMAL
  Filled 2014-11-17: qty 1

## 2014-11-17 MED ORDER — SODIUM CHLORIDE 0.9 % IJ SOLN: 9.0000 mL | INTRAMUSCULAR | Status: DC | PRN

## 2014-11-17 MED ORDER — LABETALOL HCL 5 MG/ML IV SOLN
INTRAVENOUS | Status: AC
  Filled 2014-11-17: qty 4

## 2014-11-17 MED ORDER — SODIUM CHLORIDE 0.9 % IJ SOLN
INTRAMUSCULAR | Status: AC
  Filled 2014-11-17: qty 100

## 2014-11-17 MED ORDER — IBUPROFEN 600 MG PO TABS
600.0000 mg | ORAL_TABLET | Freq: Four times a day (QID) | ORAL | Status: DC | PRN
  Administered 2014-11-18 – 2014-11-19 (×2): 600 mg via ORAL
  Filled 2014-11-17 (×2): qty 1

## 2014-11-17 MED ORDER — KETOROLAC TROMETHAMINE 30 MG/ML IJ SOLN
30.0000 mg | Freq: Four times a day (QID) | INTRAMUSCULAR | Status: AC
  Administered 2014-11-18 (×2): 30 mg via INTRAVENOUS
  Filled 2014-11-17 (×2): qty 1

## 2014-11-17 MED ORDER — FENTANYL CITRATE (PF) 250 MCG/5ML IJ SOLN
INTRAMUSCULAR | Status: AC
  Filled 2014-11-17: qty 5

## 2014-11-17 MED ORDER — LIDOCAINE HCL (CARDIAC) 20 MG/ML IV SOLN
INTRAVENOUS | Status: AC
  Filled 2014-11-17: qty 5

## 2014-11-17 MED ORDER — HEPARIN SODIUM (PORCINE) 5000 UNIT/ML IJ SOLN
INTRAMUSCULAR | Status: AC
  Filled 2014-11-17: qty 1

## 2014-11-17 MED ORDER — HYDROMORPHONE HCL 1 MG/ML IJ SOLN
INTRAMUSCULAR | Status: AC
  Administered 2014-11-17: 0.25 mg via INTRAVENOUS
  Filled 2014-11-17: qty 1

## 2014-11-17 MED ORDER — MIDAZOLAM HCL 2 MG/2ML IJ SOLN
INTRAMUSCULAR | Status: AC
  Filled 2014-11-17: qty 2

## 2014-11-17 MED ORDER — PROPOFOL 10 MG/ML IV BOLUS
INTRAVENOUS | Status: DC | PRN
  Administered 2014-11-17: 160 mg via INTRAVENOUS
  Administered 2014-11-17: 40 mg via INTRAVENOUS

## 2014-11-17 MED ORDER — LACTATED RINGERS IV SOLN
INTRAVENOUS | Status: DC
  Administered 2014-11-17 (×3): via INTRAVENOUS

## 2014-11-17 MED ORDER — PROPOFOL 10 MG/ML IV BOLUS
INTRAVENOUS | Status: AC
  Filled 2014-11-17: qty 20

## 2014-11-17 MED ORDER — ONDANSETRON HCL 4 MG/2ML IJ SOLN
4.0000 mg | Freq: Four times a day (QID) | INTRAMUSCULAR | Status: DC | PRN
  Administered 2014-11-18: 4 mg via INTRAVENOUS
  Filled 2014-11-17: qty 2

## 2014-11-17 MED ORDER — LABETALOL HCL 5 MG/ML IV SOLN
INTRAVENOUS | Status: DC | PRN
  Administered 2014-11-17: 10 mg via INTRAVENOUS
  Administered 2014-11-17 (×2): 5 mg via INTRAVENOUS

## 2014-11-17 MED ORDER — LIDOCAINE HCL (CARDIAC) 20 MG/ML IV SOLN
INTRAVENOUS | Status: DC | PRN
  Administered 2014-11-17: 60 mg via INTRAVENOUS

## 2014-11-17 MED ORDER — ACETAMINOPHEN 10 MG/ML IV SOLN
1000.0000 mg | Freq: Once | INTRAVENOUS | Status: AC
  Administered 2014-11-17: 1000 mg via INTRAVENOUS
  Filled 2014-11-17: qty 100

## 2014-11-17 MED ORDER — PROMETHAZINE HCL 25 MG/ML IJ SOLN: 6.2500 mg | INTRAMUSCULAR | Status: DC | PRN

## 2014-11-17 MED ORDER — HYDROMORPHONE 1 MG/ML IV SOLN
INTRAVENOUS | Status: DC
  Administered 2014-11-17: 20:00:00 via INTRAVENOUS
  Administered 2014-11-17: 3 mg via INTRAVENOUS
  Administered 2014-11-18: 4.2 mg via INTRAVENOUS
  Administered 2014-11-18: 2.1 mg via INTRAVENOUS
  Filled 2014-11-17: qty 25

## 2014-11-17 MED ORDER — NEOSTIGMINE METHYLSULFATE 10 MG/10ML IV SOLN
INTRAVENOUS | Status: DC | PRN
  Administered 2014-11-17: 2 mg via INTRAVENOUS

## 2014-11-17 MED ORDER — MENTHOL 3 MG MT LOZG: 1.0000 | LOZENGE | OROMUCOSAL | Status: DC | PRN

## 2014-11-17 MED ORDER — VASOPRESSIN 20 UNIT/ML IV SOLN
INTRAVENOUS | Status: AC
  Filled 2014-11-17: qty 1

## 2014-11-17 MED ORDER — HYDROMORPHONE HCL 1 MG/ML IJ SOLN
0.2500 mg | INTRAMUSCULAR | Status: DC | PRN
  Administered 2014-11-17 (×2): 0.25 mg via INTRAVENOUS

## 2014-11-17 MED ORDER — NALOXONE HCL 0.4 MG/ML IJ SOLN: 0.4000 mg | INTRAMUSCULAR | Status: DC | PRN

## 2014-11-17 SURGICAL SUPPLY — 68 items
CABLE HIGH FREQUENCY MONO STRZ (ELECTRODE) IMPLANT
CATH ROBINSON RED A/P 16FR (CATHETERS) IMPLANT
CLOSURE WOUND 1/4 X3 (GAUZE/BANDAGES/DRESSINGS)
CLOTH BEACON ORANGE TIMEOUT ST (SAFETY) ×4 IMPLANT
CONT PATH 16OZ SNAP LID 3702 (MISCELLANEOUS) ×4 IMPLANT
COVER BACK TABLE 60X90IN (DRAPES) ×4 IMPLANT
COVER MAYO STAND STRL (DRAPES) IMPLANT
DECANTER SPIKE VIAL GLASS SM (MISCELLANEOUS) IMPLANT
DRAPE SHEET LG 3/4 BI-LAMINATE (DRAPES) ×8 IMPLANT
DRSG COVADERM PLUS 2X2 (GAUZE/BANDAGES/DRESSINGS) ×6 IMPLANT
DRSG OPSITE POSTOP 3X4 (GAUZE/BANDAGES/DRESSINGS) ×2 IMPLANT
DURAPREP 26ML APPLICATOR (WOUND CARE) ×4 IMPLANT
ELECT REM PT RETURN 9FT ADLT (ELECTROSURGICAL) ×4
ELECTRODE REM PT RTRN 9FT ADLT (ELECTROSURGICAL) IMPLANT
EVACUATOR SMOKE 8.L (FILTER) ×8 IMPLANT
FORCEPS CUTTING 33CM 5MM (CUTTING FORCEPS) ×2 IMPLANT
GAUZE PACKING 2X5 YD STRL (GAUZE/BANDAGES/DRESSINGS) ×2 IMPLANT
GAUZE SPONGE 4X4 16PLY XRAY LF (GAUZE/BANDAGES/DRESSINGS) ×2 IMPLANT
GAUZE VASELINE 3X9 (GAUZE/BANDAGES/DRESSINGS) IMPLANT
GLOVE BIO SURGEON STRL SZ 6.5 (GLOVE) ×6 IMPLANT
GLOVE BIO SURGEONS STRL SZ 6.5 (GLOVE) ×2
GLOVE BIOGEL PI IND STRL 6.5 (GLOVE) ×2 IMPLANT
GLOVE BIOGEL PI IND STRL 7.0 (GLOVE) ×6 IMPLANT
GLOVE BIOGEL PI INDICATOR 6.5 (GLOVE) ×2
GLOVE BIOGEL PI INDICATOR 7.0 (GLOVE) ×6
LEGGING LITHOTOMY PAIR STRL (DRAPES) ×4 IMPLANT
LIQUID BAND (GAUZE/BANDAGES/DRESSINGS) IMPLANT
NDL MAYO CATGUT SZ4 TPR NDL (NEEDLE) ×2 IMPLANT
NEEDLE MAYO CATGUT SZ4 (NEEDLE) ×4 IMPLANT
NS IRRIG 1000ML POUR BTL (IV SOLUTION) ×4 IMPLANT
OCCLUDER COLPOPNEUMO (BALLOONS) IMPLANT
PACK LAVH (CUSTOM PROCEDURE TRAY) ×4 IMPLANT
PACK ROBOTIC GOWN (GOWN DISPOSABLE) ×4 IMPLANT
PAD POSITIONING PINK XL (MISCELLANEOUS) ×4 IMPLANT
SCISSORS LAP 5X35 DISP (ENDOMECHANICALS) IMPLANT
SET CYSTO W/LG BORE CLAMP LF (SET/KITS/TRAYS/PACK) ×4 IMPLANT
SET IRRIG TUBING LAPAROSCOPIC (IRRIGATION / IRRIGATOR) IMPLANT
SHEARS HARMONIC ACE PLUS 36CM (ENDOMECHANICALS) IMPLANT
SLEEVE XCEL OPT CAN 5 100 (ENDOMECHANICALS) ×4 IMPLANT
SOLUTION ELECTROLUBE (MISCELLANEOUS) IMPLANT
SPONGE SURGIFOAM ABS GEL 12-7 (HEMOSTASIS) ×4 IMPLANT
STRIP CLOSURE SKIN 1/4X3 (GAUZE/BANDAGES/DRESSINGS) IMPLANT
SUT CHROMIC 0 CT 1 (SUTURE) ×6 IMPLANT
SUT MNCRL AB 3-0 PS2 27 (SUTURE) ×8 IMPLANT
SUT PDS AB 1 CT1 36 (SUTURE) IMPLANT
SUT VIC AB 0 CT1 18XCR BRD8 (SUTURE) ×6 IMPLANT
SUT VIC AB 0 CT1 27 (SUTURE) ×4
SUT VIC AB 0 CT1 27XBRD ANBCTR (SUTURE) ×2 IMPLANT
SUT VIC AB 0 CT1 36 (SUTURE) ×4 IMPLANT
SUT VIC AB 0 CT1 8-18 (SUTURE) ×12
SUT VICRYL 0 ENDOLOOP (SUTURE) IMPLANT
SUT VICRYL 0 TIES 12 18 (SUTURE) ×4 IMPLANT
SUT VICRYL 0 UR6 27IN ABS (SUTURE) IMPLANT
SYR 50ML LL SCALE MARK (SYRINGE) IMPLANT
SYR BULB IRRIGATION 50ML (SYRINGE) ×4 IMPLANT
SYR TB 1ML LUER SLIP (SYRINGE) ×4 IMPLANT
TIP UTERINE 5.1X6CM LAV DISP (MISCELLANEOUS) IMPLANT
TIP UTERINE 6.7X10CM GRN DISP (MISCELLANEOUS) IMPLANT
TIP UTERINE 6.7X6CM WHT DISP (MISCELLANEOUS) IMPLANT
TIP UTERINE 6.7X8CM BLUE DISP (MISCELLANEOUS) IMPLANT
TOWEL OR 17X24 6PK STRL BLUE (TOWEL DISPOSABLE) ×8 IMPLANT
TRAY FOLEY CATH SILVER 14FR (SET/KITS/TRAYS/PACK) ×4 IMPLANT
TROCAR 5M 150ML BLDLS (TROCAR) ×2 IMPLANT
TROCAR BALLN 12MMX100 BLUNT (TROCAR) ×6 IMPLANT
TROCAR XCEL NON-BLD 11X100MML (ENDOMECHANICALS) IMPLANT
TROCAR XCEL NON-BLD 5MMX100MML (ENDOMECHANICALS) ×4 IMPLANT
TUBING FILTER THERMOFLATOR (ELECTROSURGICAL) IMPLANT
WATER STERILE IRR 1000ML POUR (IV SOLUTION) ×4 IMPLANT

## 2014-11-17 NOTE — Progress Notes (Signed)
Day of Surgery Procedure(s) (LRB): LAPAROSCOPIC ASSISTED VAGINAL HYSTERECTOMY WITH BILATERAL SALPINGECTOMY (Bilateral) CYSTOSCOPY  Subjective: Patient reports tolerating PO.    Objective: I have reviewed patient's vital signs, intake and output and medications. BP 132/67 mmHg  Pulse 75  Temp(Src) 98.7 F (37.1 C) (Oral)  Resp 24  SpO2 97%  General: alert and cooperative  CV RRR Lungs CTAB ABD soft NT  Assessment: s/p Procedure(s) with comments: LAPAROSCOPIC ASSISTED VAGINAL HYSTERECTOMY WITH BILATERAL SALPINGECTOMY (Bilateral) - 169.6g CYSTOSCOPY: stable  Plan: Advance diet Encourage ambulation  DC vaginal packing and foley in AM with PCA     [place] A 11/17/2014, 11:07 PM

## 2014-11-17 NOTE — H&P (View-Only) (Signed)
Janice Terrell is a 39 y.o.  female P: 2-0-0-2 presents for hysterectomy because of abnormal uterine bleeding.  For the past 6 months the patient has had 5 days of vaginal bleeding every 2 weeks during which time she changes a pad and tampon every 20-30 minutes.  She reports large clots and frequent soiling of clothes and linen.  Though she has cramping rated at 8/10 on a 10 point pain scale she is able to "take the edge off" with Ibuprofen 400 mg.  She denies any inter-menstrual bleeding, post coital bleeding, changes in bowel or bladder function.  Prior to the past 5 months she had a monthly 5 day period but only change her pad hourly.  A TSH and Prolactin were normal but her hemoglobin and hematocrit were 9.7/31.6.  An endometrial biopsy at that same time returned secretory pattern endometrium with no atypia or malignancy. Though the patient was made aware of medical management options for her symptoms,  she has opted for definitive therapy in the form of hysterectomy.   Past Medical History  OB History: G: 2  P: 2-0-0-2;   SVB 1991  (infant weighed 6 lbs.)  C-section 1998  GYN History: menarche: 39 YO    LMP: 10/27/14    Contracepton bilateral tubal ligation  The patient denies history of sexually transmitted disease.  Denies history of abnormal PAP smear.  Last PAP smear June 2016-normal  Medical History:   Surgical History: 1998  Tubal Sterilization (post partum)    2014  Left Foot Surgery Denies problems with anesthesia or history of blood transfusions  Family History: Negative  Social History: Married and employed by [employer];  Denies Tobacco use and occasionally consumes Alcohol    Medications:   Ibuprofen 400 mg prn               Iron  daily  No Known Allergies    Denies sensitivity to peanuts, shellfish, soy, latex or adhesives.   ROS: Denies corrective lenses, removable dental ware,  headache, vision changes, nasal congestion, dysphagia, tinnitus, dizziness, hoarseness, cough,   chest pain, shortness of breath, nausea, vomiting, diarrhea,constipation,  urinary frequency, urgency  dysuria, hematuria, vaginitis symptoms, pelvic pain, swelling of joints,easy bruising,  myalgias, arthralgias, skin rashes, unexplained weight loss and except as is mentioned in the history of present illness, patient's review of systems is otherwise negative.   Physical Exam  Bp: 136/80    P: 68     Temperature:  99 degrees F orally        Weight: 285 lbs.  Height: 5'5"   BMI: 52.2  Neck: supple without masses or thyromegaly Lungs: clear to auscultation Heart: regular rate and rhythm Abdomen: soft, non-tender and no organomegaly Pelvic:EGBUS- wnl; vagina-normal rugae; uterus-appears normal size, though exam limited by habitus, cervix without lesions or motion tenderness; adnexae-no tenderness or masses Extremities:  no clubbing, cyanosis or edema   Assesment: Dysfunctional Uterine Bleeding   Disposition:  A discussion was held with patient regarding the indication for her procedure(s) along with the risks, which include but are not limited to: reaction to anesthesia, damage to adjacent organs, infection and excessive bleeding.  The patient verbalized understanding of these risks and has consented to proceed with a Laparoscopically Assisted Vaginal Hysterectomy with Bilateral Salpingectomy at [place] on November 17, 2014.   CSN# [id number]   Ingram Onnen J. Florene Glen, PA-C  for Dr. Franklyn Lor. Dillard

## 2014-11-17 NOTE — Interval H&P Note (Signed)
History and Physical Interval Note:  11/17/2014 1:11 PM  Janice Terrell  has presented today for surgery, with the diagnosis of Abnormal Uterine Bleeding  The various methods of treatment have been discussed with the patient and family. After consideration of risks, benefits and other options for treatment, the patient has consented to  Procedure(s): LAPAROSCOPIC ASSISTED VAGINAL HYSTERECTOMY WITH BILATERAL SALPINGECTOMY (Bilateral) as a surgical intervention .  The patient's history has been reviewed, patient examined, no change in status, stable for surgery.  I have reviewed the patient's chart and labs.  Questions were answered to the patient's satisfaction.     [facility]

## 2014-11-17 NOTE — Anesthesia Preprocedure Evaluation (Addendum)
Anesthesia Evaluation  Patient identified by MRN, date of birth, ID band Patient awake    Reviewed: Allergy & Precautions, H&P , NPO status , Patient's Chart, lab work & pertinent test results  History of Anesthesia Complications Negative for: history of anesthetic complications  Airway Mallampati: I  TM Distance: >3 FB Neck ROM: full    Dental no notable dental hx.    Pulmonary neg pulmonary ROS,    Pulmonary exam normal breath sounds clear to auscultation       Cardiovascular negative cardio ROS Normal cardiovascular exam Rhythm:regular Rate:Normal     Neuro/Psych negative neurological ROS     GI/Hepatic negative GI ROS, Neg liver ROS,   Endo/Other  Morbid obesitySuper morbid obesity  Renal/GU negative Renal ROS     Musculoskeletal   Abdominal (+) + obese,   Peds  Hematology negative hematology ROS (+)   Anesthesia Other Findings   Reproductive/Obstetrics negative OB ROS                            Anesthesia Physical Anesthesia Plan  ASA: III  Anesthesia Plan: General   Post-op Pain Management:    Induction: Intravenous  Airway Management Planned: Oral ETT  Additional Equipment: None  Intra-op Plan:   Post-operative Plan: Extubation in OR  Informed Consent: I have reviewed the patients History and Physical, chart, labs and discussed the procedure including the risks, benefits and alternatives for the proposed anesthesia with the patient or authorized representative who has indicated his/her understanding and acceptance.   Dental Advisory Given  Plan Discussed with: Anesthesiologist, CRNA and Surgeon  Anesthesia Plan Comments: (Ramp positioning for intubation)        Anesthesia Quick Evaluation

## 2014-11-17 NOTE — Transfer of Care (Signed)
Immediate Anesthesia Transfer of Care Note  Patient: Janice Terrell  Procedure(s) Performed: Procedure(s) with comments: LAPAROSCOPIC ASSISTED VAGINAL HYSTERECTOMY WITH BILATERAL SALPINGECTOMY (Bilateral) - 169.6g CYSTOSCOPY  Patient Location: PACU  Anesthesia Type:General  Level of Consciousness: awake, alert , oriented and patient cooperative  Airway & Oxygen Therapy: Patient Spontanous Breathing and Patient connected to nasal cannula oxygen  Post-op Assessment: Report given to RN and Post -op Vital signs reviewed and stable  Post vital signs: Reviewed and stable  Last Vitals:  Filed Vitals:   11/17/14 1157  BP: 146/91  Pulse: 82  Temp: 37.5 C  Resp: 18    Complications: No apparent anesthesia complications

## 2014-11-17 NOTE — Anesthesia Postprocedure Evaluation (Signed)
  Anesthesia Post-op Note  Patient: Janice Terrell  Procedure(s) Performed: Procedure(s) (LRB): LAPAROSCOPIC ASSISTED VAGINAL HYSTERECTOMY WITH BILATERAL SALPINGECTOMY (Bilateral) CYSTOSCOPY  Patient Location: PACU  Anesthesia Type: General  Level of Consciousness: awake and alert   Airway and Oxygen Therapy: Patient Spontanous Breathing  Post-op Pain: mild  Post-op Assessment: Post-op Vital signs reviewed, Patient's Cardiovascular Status Stable, Respiratory Function Stable, Patent Airway and No signs of Nausea or vomiting  Last Vitals:  Filed Vitals:   11/17/14 1800  BP: 126/65  Pulse: 74  Temp:   Resp: 18    Post-op Vital Signs: stable   Complications: No apparent anesthesia complications

## 2014-11-17 NOTE — Anesthesia Procedure Notes (Signed)
Procedure Name: Intubation Date/Time: 11/17/2014 1:30 PM Performed by: Raenette Rover Pre-anesthesia Checklist: Patient identified, Emergency Drugs available, Suction available and Patient being monitored Patient Re-evaluated:Patient Re-evaluated prior to inductionOxygen Delivery Method: Circle system utilized Preoxygenation: Pre-oxygenation with 100% oxygen Intubation Type: IV induction Ventilation: Mask ventilation without difficulty Laryngoscope Size: Miller and 3 Grade View: Grade I Tube type: Oral Tube size: 7.0 mm Number of attempts: 1 Airway Equipment and Method: Patient positioned with wedge pillow and Stylet Placement Confirmation: breath sounds checked- equal and bilateral,  ETT inserted through vocal cords under direct vision,  positive ETCO2 and CO2 detector Secured at: 20 cm Tube secured with: Tape Dental Injury: Teeth and Oropharynx as per pre-operative assessment

## 2014-11-17 NOTE — Op Note (Signed)
reop Diagnosis: Menorrhagia, Symptomatic anemia  Post op Diagnosis same  Procedure: LAVH, B salpingectmy,  Cystoscopy  Anesthesia: General   Anesthesiologist: Dr Royce Macadamia  Attending: Betsy Coder, MD   Assistant: Earnstine Regal PA  Findings: -year-old and and and before and immediately and he is in the days  Normal appearing appendix  Pathology: uterus and cervix and bilateral tubes  Fluids: 3200 cccrystalloid  UOP: 1000cc  EBL: 409WJ  Complications:none  Procedure: The patient was taken to the operating room, placed under general anesthesia and prepped and draped in the normal sterile fashion. A Foley catheter was placed in the bladder . A weighted speculum and vaginal retractors were placed in the vagina. Tenaculum was placed on the anterior lip of the cervix.  A hulka manipulator was placed in the uterus. Attention was then turned to the abdomen. A 10 mm infraumbilical incision was made with the scalpel after 5 cc of 25% percent Marcaine was used for local anesthesia. The subcutaneous tissue was dissected and the fascia was incised with the knife. A purse string stitch was placed in the fascia and Hassan placed into the intra-abdominal cavity and anchored to the suture. Intraabdominal placement was confirmed with the laparoscope.  Two 5 mm trochars were placed in the right and left lower quadrants under direct visualization with the laparoscope.   Both round ligaments were cauterized and cut with the gyrus bipolar cautery as well and the bladder flap created with the tripolar and removed away from the uterus. The left fallopian tube was cauterized and removed. There was a hydrosal[pinx noted.   The left  The left uterine ovarian ligament was then cauterized and cut.  The right fallopian tube was cauterized cut and removed.  The right utero-ovarian ligament was cauterized and cut with the tripolar cautery gyrus.  LOA took approximately one hour. Attention was then turned to the vagina.  A  weighted speculum was placed in the posterior fourchette.  petrussin mixture was placed circumferentially around the cervix.  With blunt and sharp dissection the cervix was dissected away from the bowel and bladder.  Both uterosacral ligaments were clamped, cut and suture ligated and held.  The anterior and posterior culdesac was entered sharply using metzenbaum scissors.  The cardinal ligaments and  The uterine arteries were clamped, cut and suture ligated bilaterally.   Visualization was difficult. The weighted retractor kept coming out bc of the shape of the buttocks.  All weighted retractors were not working well.  And and and andrigby was also used.   The uterus was then delivered.  The mcall suture was placed.   The vaginal cuff was closed with interrupted suture of 0 chromic.  Pit was given methyline is blue.  Cystoscopy was performed and both ureters were seen to efflux.   I only saw methylene blue from right ureter.   vagina was inspected and the cuff was noted to be intact.  Attention was then turned back to the abdomen after removing top pair of gloves. The abdomen was reinsufflated with CO2 gas.   The abdomen and pelvis was copiously irrigated.  .     hemostasis was noted.  Gelfoam placed on he cuff.    All trochars were removed under direct visualization using the laparoscope.  The umbilical fascia was reapproximated by tying the circumferential suture. The two 5 mm incisions were closed with 3-0 Monocryl via a subcuticular stitch.  All remaining skin incisions were closed with Dermabond and the 10 mm skin incisions were  reinforced using Dermabond.  Sponge lap and needle counts were correct.  The patient tolerated the procedure well and was returned to the PACU in stable condition

## 2014-11-18 ENCOUNTER — Encounter (HOSPITAL_COMMUNITY): Payer: Self-pay | Admitting: Obstetrics and Gynecology

## 2014-11-18 DIAGNOSIS — N920 Excessive and frequent menstruation with regular cycle: Secondary | ICD-10-CM | POA: Diagnosis not present

## 2014-11-18 LAB — CBC WITH DIFFERENTIAL/PLATELET
BASOS ABS: 0 10*3/uL (ref 0.0–0.1)
BASOS PCT: 0 %
EOS ABS: 0 10*3/uL (ref 0.0–0.7)
Eosinophils Relative: 0 %
HCT: 29 % — ABNORMAL LOW (ref 36.0–46.0)
HEMOGLOBIN: 8.8 g/dL — AB (ref 12.0–15.0)
LYMPHS ABS: 1.9 10*3/uL (ref 0.7–4.0)
Lymphocytes Relative: 12 %
MCH: 22.9 pg — ABNORMAL LOW (ref 26.0–34.0)
MCHC: 30.3 g/dL (ref 30.0–36.0)
MCV: 75.3 fL — ABNORMAL LOW (ref 78.0–100.0)
Monocytes Absolute: 1.5 10*3/uL — ABNORMAL HIGH (ref 0.1–1.0)
Monocytes Relative: 9 %
NEUTROS PCT: 79 %
Neutro Abs: 13.2 10*3/uL — ABNORMAL HIGH (ref 1.7–7.7)
Platelets: 394 10*3/uL (ref 150–400)
RBC: 3.85 MIL/uL — AB (ref 3.87–5.11)
RDW: 20.6 % — ABNORMAL HIGH (ref 11.5–15.5)
WBC: 16.6 10*3/uL — AB (ref 4.0–10.5)

## 2014-11-18 LAB — PREPARE RBC (CROSSMATCH)

## 2014-11-18 MED ORDER — SODIUM CHLORIDE 0.9 % IV SOLN
Freq: Once | INTRAVENOUS | Status: AC
  Administered 2014-11-18: 09:00:00 via INTRAVENOUS

## 2014-11-18 NOTE — Progress Notes (Signed)
Janice Terrell is a62 y.o.  [id number]  Post Op Date # 1:   LAVH/BS  Subjective: Patient is Postoperative course complicated by orthostasis. Patient has Pain is controlled with current analgesics. Medications being used: prescription NSAID's including Ketorolac and narcotic analgesics including hydromorphone (Dilaudid). Upon standing and walking experienced dizziness, vomiting and weakness.  Tolerating ice chips.   Objective: Vital signs in last 24 hours: Temp:  [98 F (36.7 C)-99.5 F (37.5 C)] 98 F (36.7 C) (10/19 0518) Pulse Rate:  [74-83] 79 (10/19 0130) Resp:  [12-36] 16 (10/19 0600) BP: (100-146)/(44-91) 100/44 mmHg (10/19 0130) SpO2:  [93 %-100 %] 99 % (10/19 0600)  Intake/Output from previous day: 10/18 0701 - 10/19 0700 In: 3460 [P.O.:960; I.V.:2500] Out: 2200 [Urine:1275] Intake/Output this shift: Total I/O In: 1460 [P.O.:960; I.V.:500] Out: 1425 [Urine:900; Emesis/NG output:525]  Recent Labs Lab 11/18/14 0500  WBC 16.6*  HGB 8.8*  HCT 29.0*  PLT 394    No results for input(s): NA, K, CL, CO2, BUN, CREATININE, CALCIUM, PROT, BILITOT, ALKPHOS, ALT, AST, GLUCOSE in the last 168 hours.  Invalid input(s): LABALBU  EXAM: General: alert, cooperative and no distress Resp: clear to auscultation bilaterally Cardio: regular rate and rhythm, S1, S2 normal, no murmur, click, rub or gallop GI: soft, non-tender; bowel sounds normal; no masses,  no organomegaly Extremities: Homans sign is negative, no sign of DVT and SCD hose in place and functioning.   Assessment: s/p Procedure(s): LAPAROSCOPIC ASSISTED VAGINAL HYSTERECTOMY WITH BILATERAL SALPINGECTOMY CYSTOSCOPY: anemia and orthostatic  Plan: Transfuse 2 units of packed red blood cells; Dr. Charlesetta Garibaldi previously discussed this possibility with the patient to include but not limited to  the risks of infection, allergic reaction.   Advance diet a as tolerated and ambulate as the patient is able D/C PCA and begin oral  medications Routine care     Shon Mansouri, PA-C 11/18/2014 6:52 AM

## 2014-11-19 DIAGNOSIS — N920 Excessive and frequent menstruation with regular cycle: Secondary | ICD-10-CM | POA: Diagnosis not present

## 2014-11-19 LAB — CBC WITH DIFFERENTIAL/PLATELET
Basophils Absolute: 0 10*3/uL (ref 0.0–0.1)
Basophils Relative: 0 %
EOS ABS: 0.2 10*3/uL (ref 0.0–0.7)
Eosinophils Relative: 2 %
HCT: 30.7 % — ABNORMAL LOW (ref 36.0–46.0)
HEMOGLOBIN: 9.4 g/dL — AB (ref 12.0–15.0)
LYMPHS ABS: 2.7 10*3/uL (ref 0.7–4.0)
LYMPHS PCT: 24 %
MCH: 23.3 pg — AB (ref 26.0–34.0)
MCHC: 30.6 g/dL (ref 30.0–36.0)
MCV: 76.2 fL — AB (ref 78.0–100.0)
MONOS PCT: 6 %
Monocytes Absolute: 0.7 10*3/uL (ref 0.1–1.0)
NEUTROS PCT: 68 %
Neutro Abs: 7.6 10*3/uL (ref 1.7–7.7)
Platelets: 291 10*3/uL (ref 150–400)
RBC: 4.03 MIL/uL (ref 3.87–5.11)
RDW: 19.9 % — ABNORMAL HIGH (ref 11.5–15.5)
WBC: 11.2 10*3/uL — AB (ref 4.0–10.5)

## 2014-11-19 MED ORDER — IBUPROFEN 600 MG PO TABS: ORAL_TABLET | ORAL | Status: DC

## 2014-11-19 MED ORDER — OXYCODONE-ACETAMINOPHEN 5-325 MG PO TABS: 1.0000 | ORAL_TABLET | ORAL | Status: DC | PRN

## 2014-11-19 NOTE — Discharge Summary (Signed)
Physician Discharge Summary  Patient ID: Janice Terrell MRN: [id number] DOB/AGE: December 04, 1975 39 y.o.  Admit date: 11/17/2014 Discharge date: 11/19/2014   Discharge Diagnoses: Dysfunctional Uterine Bleeding and Anemia Active Problems:   Irregular bleeding   Operation: Laparoscopically Assisted Vaginal Hysterectomy with Bilateral Salpingectomy   Discharged Condition: Good   Hospital Course: On the date of admission the patient underwent the aforementioned procedures and tolerated them well.  Post operative course was marked by the patient being orthostatic with a hemoglobin of 8.8.  She was transfused 2 units of packed red blood cells and achieved a post transfusion hemoglobin of 9.4 and symptoms resolved.  By post operative day #2 the patient had resumed bowel and bladder function and was deemed ready for discharge home.  Disposition: 01-Home or Self Care  Discharge Medications:    Medication List    TAKE these medications        ibuprofen 600 MG tablet  Commonly known as:  ADVIL,MOTRIN  1 po pc every 6 hours for 5 days then prn-pain     IRON PO  Take 1 tablet by mouth daily.     oxyCODONE-acetaminophen 5-325 MG tablet  Commonly known as:  PERCOCET/ROXICET  Take 1-2 tablets by mouth every 4 (four) hours as needed for severe pain (moderate to severe pain (when tolerating fluids)).          Follow-up: Dr. Gwynneth Munson A. Dillard on December 23, 2014 at 1:45 p.m.   SignedEarnstine Regal, PA-C 11/19/2014, 7:51 AM

## 2014-11-19 NOTE — Progress Notes (Signed)
Pt.discharged in the care of Mother with N.T. Escort. Denies any pain or discomfort. Spirits are good. Discharged instructions with Rx were given to  Pt. States  She understands all instructions well .Marland KitchenAbdominal lapsites are clean and dry

## 2014-11-19 NOTE — Progress Notes (Signed)
Janice Terrell is a68 y.o.  [id number]  Post Op Date # 2:  LAVH/BS  Subjective: Patient is Doing well postoperatively. Patient has The patient is not having any pain. No longer lightheaded with ambulation, voiding and tolerating a regular diet.    Objective: Vital signs in last 24 hours: Temp:  [97.8 F (36.6 C)-98.9 F (37.2 C)] 98.5 F (36.9 C) (10/20 0555) Pulse Rate:  [65-78] 75 (10/20 0555) Resp:  [14-18] 18 (10/20 0555) BP: (102-129)/(46-78) 122/58 mmHg (10/20 0555) SpO2:  [94 %-100 %] 100 % (10/20 0555)  Intake/Output from previous day: 10/19 0701 - 10/20 0700 In: 780  Out: 1300 [Urine:1300] Intake/Output this shift:    Recent Labs Lab 11/18/14 0500 11/19/14 0510  WBC 16.6* 11.2*  HGB 8.8* 9.4*  HCT 29.0* 30.7*  PLT 394 291    No results for input(s): NA, K, CL, CO2, BUN, CREATININE, CALCIUM, PROT, BILITOT, ALKPHOS, ALT, AST, GLUCOSE in the last 168 hours.  Invalid input(s): LABALBU  EXAM: General: alert, cooperative and no distress Resp: clear to auscultation bilaterally Cardio: regular rate and rhythm, S1, S2 normal, no murmur, click, rub or gallop GI: Bowel sounds present, soft, dressings clean/dry.intact. Extremities: Homans sign is negative, no sign of DVT and no calf tenderness.   Assessment: s/p Procedure(s): LAPAROSCOPIC ASSISTED VAGINAL HYSTERECTOMY WITH BILATERAL SALPINGECTOMY CYSTOSCOPY: progressing well and anemia  Plan: Discharge home     [place], PA-C 11/19/2014 7:36 AM

## 2014-11-19 NOTE — Discharge Instructions (Signed)
Call [facility] @ [phone] if:  You have a temperature greater than or equal to 100.4 degrees Farenheit orally You have pain that is not made better by the pain medication given and taken as directed You have excessive bleeding or problems urinating  Take Colace (Docusate Sodium/Stool Softener) 100 mg 2-3 times daily while taking narcotic pain medicine to avoid constipation or until bowel movements are regular. Take iron supplement twice a day for the next 12 weeks  You may drive after 2 weeks You may walk up steps  You may shower tomorrow You may resume a regular diet  Keep incisions clean and dry.  Remove "honeycomb" dressing on Tuesday, November 24, 2014 Do not lift over 15 pounds for 6 weeks Avoid anything in vagina for 6 weeks (or until after your post-operative visit)  Keep follow up appointment with Dr. Charlesetta Garibaldi on December 23, 2014 at 1:45 p.m. Laparoscopically Assisted Vaginal Hysterectomy, Care After Refer to this sheet in the next few weeks. These instructions provide you with information on caring for yourself after your procedure. Your health care provider may also give you more specific instructions. Your treatment has been planned according to current medical practices, but problems sometimes occur. Call your health care provider if you have any problems or questions after your procedure. WHAT TO EXPECT AFTER THE PROCEDURE After your procedure, it is typical to have the following:  Abdominal pain. You will be given pain medicine to control it.  Sore throat from the breathing tube that was inserted during surgery. HOME CARE INSTRUCTIONS  Only take over-the-counter or prescription medicines for pain, discomfort, or fever as directed by your health care provider.  Do not take aspirin. It can cause bleeding.  Do not drive when taking pain medicine.  Follow your health care provider's advice regarding diet, exercise, lifting, driving, and general  activities.  Resume your usual diet as directed and allowed.  Get plenty of rest and sleep.  Do not douche, use tampons, or have sexual intercourse for at least 6 weeks, or until your health care provider gives you permission.  Change your bandages (dressings) as directed by your health care provider.  Monitor your temperature and notify your health care provider of a fever.  Take showers instead of baths for 2-3 weeks.  Do not drink alcohol until your health care provider gives you permission.  If you develop constipation, you may take a mild laxative with your health care provider's permission. Bran foods may help with constipation problems. Drinking enough fluids to keep your urine clear or pale yellow may help as well.  Try to have someone home with you for 1-2 weeks to help around the house.  Keep all of your follow-up appointments as directed by your health care provider. SEEK MEDICAL CARE IF:   You have swelling, redness, or increasing pain around your incision sites.  You have pus coming from your incision.  You notice a bad smell coming from your incision.  Your incision breaks open.  You feel dizzy or lightheaded.  You have pain or bleeding when you urinate.  You have persistent diarrhea.  You have persistent nausea and vomiting.  You have abnormal vaginal discharge.  You have a rash.  You have any type of abnormal reaction or develop an allergy to your medicine.  You have poor pain control with your prescribed medicine. SEEK IMMEDIATE MEDICAL CARE IF:   You have a fever.  You have severe abdominal pain.  You have chest pain.  You have shortness of breath.  You faint.  You have pain, swelling, or redness in your leg.  You have heavy vaginal bleeding with blood clots. MAKE SURE YOU:  Understand these instructions.  Will watch your condition.  Will get help right away if you are not doing well or get worse.   This information is not  intended to replace advice given to you by your health care provider. Make sure you discuss any questions you have with your health care provider.   Document Released: 01/05/2011 Document Revised: 01/21/2013 Document Reviewed: 08/01/2012 Elsevier Interactive Patient Education [employer].

## 2014-11-20 LAB — TYPE AND SCREEN
ABO/RH(D): O NEG
Antibody Screen: NEGATIVE
UNIT DIVISION: 0
UNIT DIVISION: 0
Unit division: 0
Unit division: 0

## 2016-03-01 ENCOUNTER — Encounter (HOSPITAL_COMMUNITY): Payer: Self-pay | Admitting: Emergency Medicine

## 2016-03-01 ENCOUNTER — Ambulatory Visit (HOSPITAL_COMMUNITY)
Admission: EM | Admit: 2016-03-01 | Discharge: 2016-03-01 | Disposition: A | Discharge status: Home / Self Care | Payer: Managed Care, Other (non HMO) | Attending: Family Medicine | Admitting: Family Medicine

## 2016-03-01 DIAGNOSIS — R69 Illness, unspecified: Secondary | ICD-10-CM | POA: Diagnosis not present

## 2016-03-01 DIAGNOSIS — J111 Influenza due to unidentified influenza virus with other respiratory manifestations: Secondary | ICD-10-CM

## 2016-03-01 MED ORDER — ACETAMINOPHEN 325 MG PO TABS
650.0000 mg | ORAL_TABLET | Freq: Once | ORAL | Status: AC
  Administered 2016-03-01: 650 mg via ORAL

## 2016-03-01 MED ORDER — ACETAMINOPHEN 325 MG PO TABS
ORAL_TABLET | ORAL | Status: AC
  Filled 2016-03-01: qty 2

## 2016-03-01 MED ORDER — ONDANSETRON 4 MG PO TBDP: 4.0000 mg | ORAL_TABLET | Freq: Three times a day (TID) | ORAL | 0 refills | Status: DC | PRN

## 2016-03-01 MED ORDER — OSELTAMIVIR PHOSPHATE 75 MG PO CAPS: 75.0000 mg | ORAL_CAPSULE | Freq: Two times a day (BID) | ORAL | 0 refills | Status: DC

## 2016-03-01 MED ORDER — BENZONATATE 100 MG PO CAPS: 100.0000 mg | ORAL_CAPSULE | Freq: Three times a day (TID) | ORAL | 0 refills | Status: DC

## 2016-03-01 NOTE — ED Provider Notes (Signed)
CSN: [id number]     Arrival date & time 03/01/16  1018 History   None    Chief Complaint  Patient presents with  . Cough   (Consider location/radiation/quality/duration/timing/severity/associated sxs/prior Treatment) 41 year old female presents with chief complaint of cough, fever,body aches, muscle aches headaches, nausea, and congestion. She has been experiencing these symptoms for 24 hours. She has loss of appetite, and feeling weak as well. She reports vomiting this morning, no diarrhea. Has not taking any OTC therapies.   The history is provided by the patient.  Cough    Past Medical History:  Diagnosis Date  . Obesity, Class III, BMI 40-49.9 (morbid obesity) ([place]) 04/27/2011   Past Surgical History:  Procedure Laterality Date  . ABDOMINAL HYSTERECTOMY    . BREATH TEK H PYLORI  05/19/2011   Procedure: BREATH TEK H PYLORI;  Surgeon: Pedro Earls, MD;  Location: [facility];  Service: General;  Laterality: N/A;  . [place]  . CYSTOSCOPY  11/17/2014   Procedure: CYSTOSCOPY;  Surgeon: Crawford Givens, MD;  Location: [facility];  Service: Gynecology;;  . TUBAL LIGATION     Family History  Problem Relation Age of Onset  . Diabetes Sister   . Diabetes Maternal Grandmother   . Diabetes Other    Social History  Substance Use Topics  . Smoking status: Never Smoker  . Smokeless tobacco: Not on file  . Alcohol use No   OB History    No data available     Review of Systems  Reason unable to perform ROS: as covered in HPI.  Respiratory: Positive for cough.   All other systems reviewed and are negative.   Allergies  Patient has no known allergies.  Home Medications   Prior to Admission medications   Medication Sig Start Date End Date Taking? Authorizing Provider  benzonatate (TESSALON) 100 MG capsule Take 1 capsule (100 mg total) by mouth every 8 (eight) hours. 03/01/16   Barnet Glasgow, NP  ondansetron (ZOFRAN ODT) 4 MG disintegrating tablet Take 1 tablet  (4 mg total) by mouth every 8 (eight) hours as needed for nausea or vomiting. 03/01/16   Barnet Glasgow, NP  oseltamivir (TAMIFLU) 75 MG capsule Take 1 capsule (75 mg total) by mouth every 12 (twelve) hours. 03/01/16   Barnet Glasgow, NP   Meds Ordered and Administered this Visit   Medications  acetaminophen (TYLENOL) tablet 650 mg (650 mg Oral Given 03/01/16 1117)    BP 144/85 (BP Location: Right Arm)   Pulse 108   Temp 101.5 F (38.6 C) (Oral)   Resp 20   LMP 08/09/2014   SpO2 100%  No data found.   Physical Exam  Constitutional: She is oriented to person, place, and time. She appears well-developed and well-nourished. She appears ill. No distress.  HENT:  Head: Normocephalic and atraumatic.  Right Ear: Tympanic membrane and external ear normal.  Left Ear: Tympanic membrane and external ear normal.  Nose: Mucosal edema and rhinorrhea present. Right sinus exhibits no maxillary sinus tenderness and no frontal sinus tenderness. Left sinus exhibits no maxillary sinus tenderness and no frontal sinus tenderness.  Mouth/Throat: Uvula is midline, oropharynx is clear and moist and mucous membranes are normal. No oropharyngeal exudate.  Eyes: Pupils are equal, round, and reactive to light.  Neck: Normal range of motion. Neck supple. No JVD present.  Cardiovascular: Normal rate and regular rhythm.   Pulmonary/Chest: Effort normal and breath sounds normal. No respiratory distress. She has no wheezes.  Abdominal: Soft. Bowel sounds are normal. She exhibits no distension. There is no tenderness. There is no guarding.  Lymphadenopathy:       Head (right side): No submandibular and no tonsillar adenopathy present.       Head (left side): No submandibular and no tonsillar adenopathy present.    She has no cervical adenopathy.  Neurological: She is alert and oriented to person, place, and time.  Skin: Skin is warm. Capillary refill takes less than 2 seconds. She is diaphoretic.  Psychiatric:  She has a normal mood and affect.  Nursing note and vitals reviewed.   Urgent Care Course     Procedures (including critical care time)  Labs Review Labs Reviewed - No data to display  Imaging Review No results found.   Visual Acuity Review  Right Eye Distance:   Left Eye Distance:   Bilateral Distance:    Right Eye Near:   Left Eye Near:    Bilateral Near:         MDM   1. Influenza-like illness   You most likely have an influenza like illness, I advise rest, plenty of fluids and management of symptoms with over the counter medicines. For symptoms you may take Tylenol as needed every 4-6 hours for body aches or fever, not to exceed 4,000 mg a day, Take mucinex or mucinex DM ever 12 hours with a full glass of water, you may use an inhaled steroid such as Flonase, 2 sprays each nostril once a day for congestion, or an antihistamine such as Claritin or Zyrtec once a day. Should your symptoms worsen or fail to resolve, follow up with your primary care provider or return to clinic.   In addition to the therapies above, I have sent a prescription to your pharmacy for Tamiflu, take 1 tablet twice a day for 5 days. Zofran, one tablet every 8 hours under the tongue as needed. For cough, take 1 tablet every 8 hours.      Barnet Glasgow, NP 03/01/16 1227

## 2016-03-01 NOTE — ED Triage Notes (Signed)
The patient presented to the [facility] with a complaint of a cough x 1 week and chills and body aches that started yesterday.

## 2016-03-01 NOTE — Discharge Instructions (Signed)
You most likely have an influenza like illness, I advise rest, plenty of fluids and management of symptoms with over the counter medicines. For symptoms you may take Tylenol as needed every 4-6 hours for body aches or fever, not to exceed 4,000 mg a day, Take mucinex or mucinex DM ever 12 hours with a full glass of water, you may use an inhaled steroid such as Flonase, 2 sprays each nostril once a day for congestion, or an antihistamine such as Claritin or Zyrtec once a day. Should your symptoms worsen or fail to resolve, follow up with your primary care provider or return to clinic.   In addition to the therapies above, I have sent a prescription to your pharmacy for Tamiflu, take 1 tablet twice a day for 5 days. Zofran, one tablet every 8 hours under the tongue as needed. For cough, take 1 tablet every 8 hours.

## 2016-03-02 ENCOUNTER — Emergency Department (HOSPITAL_COMMUNITY)
Admission: EM | Admit: 2016-03-02 | Discharge: 2016-03-02 | Disposition: A | Discharge status: Home / Self Care | Payer: Managed Care, Other (non HMO) | Attending: Emergency Medicine | Admitting: Emergency Medicine

## 2016-03-02 ENCOUNTER — Encounter (HOSPITAL_COMMUNITY): Payer: Self-pay | Admitting: Emergency Medicine

## 2016-03-02 DIAGNOSIS — J111 Influenza due to unidentified influenza virus with other respiratory manifestations: Secondary | ICD-10-CM | POA: Diagnosis not present

## 2016-03-02 DIAGNOSIS — R69 Illness, unspecified: Secondary | ICD-10-CM

## 2016-03-02 DIAGNOSIS — R509 Fever, unspecified: Secondary | ICD-10-CM | POA: Diagnosis present

## 2016-03-02 MED ORDER — ONDANSETRON HCL 4 MG/2ML IJ SOLN
4.0000 mg | Freq: Once | INTRAMUSCULAR | Status: AC
  Administered 2016-03-02: 4 mg via INTRAVENOUS
  Filled 2016-03-02: qty 2

## 2016-03-02 MED ORDER — GUAIFENESIN-CODEINE 100-10 MG/5ML PO SOLN: 5.0000 mL | Freq: Three times a day (TID) | ORAL | 0 refills | Status: DC | PRN

## 2016-03-02 MED ORDER — SODIUM CHLORIDE 0.9 % IV BOLUS (SEPSIS)
1000.0000 mL | Freq: Once | INTRAVENOUS | Status: AC
  Administered 2016-03-02: 1000 mL via INTRAVENOUS

## 2016-03-02 MED ORDER — ACETAMINOPHEN 325 MG PO TABS
650.0000 mg | ORAL_TABLET | Freq: Once | ORAL | Status: AC | PRN
  Administered 2016-03-02: 650 mg via ORAL
  Filled 2016-03-02: qty 2

## 2016-03-02 MED ORDER — GUAIFENESIN-CODEINE 100-10 MG/5ML PO SOLN
5.0000 mL | Freq: Once | ORAL | Status: AC
  Administered 2016-03-02: 5 mL via ORAL
  Filled 2016-03-02: qty 5

## 2016-03-02 NOTE — ED Triage Notes (Addendum)
Pt reports for the last two days having body aches along with fever and chills. Pt seen at Urgent care on 03/01/16. Pt states symptoms have worsened today. Pt stated that she has had a cough for over a week. Last Tylenol at 2200.

## 2016-03-02 NOTE — Discharge Instructions (Signed)
Cough syrup as needed for cough. Alternate between Tylenol and ibuprofen as discussed for fever control and body aches. It is important to increase fluid intake and stay hydrated. Follow up with her primary care provider for discussion of today's hospital visit. Return to ER for new or worsening symptoms, any additional concerns.

## 2016-03-02 NOTE — ED Provider Notes (Signed)
[facility] DEPT Provider Note   CSN: [id number] Arrival date & time: 03/02/16  0534     History   Chief Complaint Chief Complaint  Patient presents with  . flu Terrell symptoms    HPI Janice Terrell is a 41 y.o. female.  The history is provided by the patient and medical records. No language interpreter was used.   Janice Terrell is a 41 y.o. female  who presents to the Emergency Department complaining of generalized body aches, headache, intermittently productive cough, congestion and fever x 2 days. Seen by urgent care yesterday and told she likely had the flu. She has been taking Tylenol for fever, but notes that she does not feel Terrell this improved her fever symptoms. She was given rx for zofran to take as needed for nausea, but this has provided little nausea relief. She has had two episodes of emesis in the last 48 hours. No diarrhea or abdominal pain. She was given rx for Tamiflu but states that it was $100 and she could not afford medication. Chart review shows rx for tessalon for cough, but patient states she was not given a cough suppressant and cough is what is bothering her the most. She has not been able to get much sleep the past two nights due to waking up coughing throughout the night. She has been trying to stay hydrated and drink plenty of fluids, however still feels as though her mouth and skin are dry.    Past Medical History:  Diagnosis Date  . Obesity, Class III, BMI 40-49.9 (morbid obesity) ([place]) 04/27/2011    Patient Active Problem List   Diagnosis Date Noted  . Irregular bleeding 11/17/2014  . Obesity, Class III, BMI 40-49.9 (morbid obesity) ([place]) 04/27/2011    Past Surgical History:  Procedure Laterality Date  . ABDOMINAL HYSTERECTOMY    . BREATH TEK H PYLORI  05/19/2011   Procedure: BREATH TEK H PYLORI;  Surgeon: Pedro Earls, MD;  Location: [facility];  Service: General;  Laterality: N/A;  . [place]  . CYSTOSCOPY  11/17/2014   Procedure: CYSTOSCOPY;  Surgeon: Crawford Givens, MD;  Location: [facility];  Service: Gynecology;;  . TUBAL LIGATION      OB History    No data available       Home Medications    Prior to Admission medications   Medication Sig Start Date End Date Taking? Authorizing Provider  benzonatate (TESSALON) 100 MG capsule Take 1 capsule (100 mg total) by mouth every 8 (eight) hours. 03/01/16   Barnet Glasgow, NP  guaiFENesin-codeine 100-10 MG/5ML syrup Take 5 mLs by mouth 3 (three) times daily as needed for cough. 03/02/16   Ozella Almond Shanan Fitzpatrick, PA-C  ondansetron (ZOFRAN ODT) 4 MG disintegrating tablet Take 1 tablet (4 mg total) by mouth every 8 (eight) hours as needed for nausea or vomiting. 03/01/16   Barnet Glasgow, NP  oseltamivir (TAMIFLU) 75 MG capsule Take 1 capsule (75 mg total) by mouth every 12 (twelve) hours. 03/01/16   Barnet Glasgow, NP    Family History Family History  Problem Relation Age of Onset  . Diabetes Sister   . Diabetes Maternal Grandmother   . Diabetes Other     Social History Social History  Substance Use Topics  . Smoking status: Never Smoker  . Smokeless tobacco: Never Used  . Alcohol use No     Allergies   Patient has no known allergies.   Review of Systems Review of Systems  Constitutional: Positive for appetite change, chills and fever.  HENT: Positive for congestion.   Eyes: Negative for visual disturbance.  Respiratory: Positive for cough. Negative for shortness of breath and wheezing.   Cardiovascular: Negative.   Gastrointestinal: Positive for nausea and vomiting. Negative for abdominal pain, blood in stool, constipation and diarrhea.  Genitourinary: Negative for dysuria.  Musculoskeletal: Positive for myalgias.  Skin: Negative for rash.  Neurological: Positive for headaches. Negative for dizziness and weakness.     Physical Exam Updated Vital Signs BP 134/76 (BP Location: Left Arm)   Pulse 78   Temp 100.4 F (38 C) (Oral)   Resp 18    Ht 5\' 2"  (1.575 m)   Wt 122.5 kg   LMP 08/09/2014   SpO2 97%   BMI 49.38 kg/m   Physical Exam  Constitutional: She is oriented to person, place, and time. She appears well-developed and well-nourished. No distress.  HENT:  Head: Normocephalic and atraumatic.  OP with erythema, no exudates or tonsillar hypertrophy. + nasal congestion with mucosal edema.   Neck: Normal range of motion. Neck supple.  No meningeal signs.   Cardiovascular: Normal rate, regular rhythm and normal heart sounds.   Pulmonary/Chest: Effort normal.  Lungs are clear to auscultation bilaterally - no w/r/r  Abdominal: Soft. She exhibits no distension. There is no tenderness.  Musculoskeletal: Normal range of motion.  Neurological: She is alert and oriented to person, place, and time.  Skin: Skin is warm and dry. She is not diaphoretic.  Nursing note and vitals reviewed.    ED Treatments / Results  Labs (all labs ordered are listed, but only abnormal results are displayed) Labs Reviewed - No data to display  EKG  EKG Interpretation None       Radiology No results found.  Procedures Procedures (including critical care time)  Medications Ordered in ED Medications  acetaminophen (TYLENOL) tablet 650 mg (650 mg Oral Given 03/02/16 0552)  guaiFENesin-codeine 100-10 MG/5ML solution 5 mL (5 mLs Oral Given 03/02/16 0646)  sodium chloride 0.9 % bolus 1,000 mL (0 mLs Intravenous Stopped 03/02/16 0718)  ondansetron (ZOFRAN) injection 4 mg (4 mg Intravenous Given 03/02/16 [id number])     Initial Impression / Assessment and Plan / ED Course  I have reviewed the triage vital signs and the nursing notes.  Pertinent labs & imaging results that were available during my care of the patient were reviewed by me and considered in my medical decision making (see chart for details).    Janice Terrell is a 41 y.o. female who presents to ED for Influenza-Terrell illness. She was seen by urgent care yesterday, but concerned  because her symptoms are not improving. She was febrile 102.1 upon arrival, last dose of Tylenol was before bed last night. Tylenol given in ED today and temperature trending in the correct direction. 100.4 discharge. Initially with heart rate 90s which improved after a liter of IV fluids to 78. Chart review shows that she was given a prescription for Tessalon as needed for cough, however patient states that she did not get a cough suppressant and that she only has one prescription at home which is Zofran. Guaifenesin with codeine given in ED and had great cough relief. Rx for this given. Discussed symptomatic home care instructions discussed, PCP follow-up encouraged, return precautions discussed and all questions answered.  Final Clinical Impressions(s) / ED Diagnoses   Final diagnoses:  Influenza-Terrell illness    New Prescriptions Discharge Medication List as of 03/02/2016  7:47  AM    START taking these medications   Details  guaiFENesin-codeine 100-10 MG/5ML syrup Take 5 mLs by mouth 3 (three) times daily as needed for cough., Starting Thu 03/02/2016, Print         [employer] Dawn Kiper, PA-C [phone]    Delora Fuel, MD [phone]

## 2016-10-31 ENCOUNTER — Other Ambulatory Visit: Payer: Self-pay | Admitting: Internal Medicine

## 2016-10-31 DIAGNOSIS — Z1231 Encounter for screening mammogram for malignant neoplasm of breast: Secondary | ICD-10-CM

## 2016-11-10 ENCOUNTER — Ambulatory Visit: Payer: Managed Care, Other (non HMO)

## 2016-11-10 ENCOUNTER — Encounter (HOSPITAL_COMMUNITY): Payer: Self-pay

## 2016-11-10 ENCOUNTER — Emergency Department (HOSPITAL_COMMUNITY)
Admission: EM | Admit: 2016-11-10 | Discharge: 2016-11-10 | Disposition: A | Discharge status: Home / Self Care | Payer: Managed Care, Other (non HMO) | Attending: Emergency Medicine | Admitting: Emergency Medicine

## 2016-11-10 ENCOUNTER — Emergency Department (HOSPITAL_COMMUNITY): Payer: Managed Care, Other (non HMO)

## 2016-11-10 DIAGNOSIS — R200 Anesthesia of skin: Secondary | ICD-10-CM

## 2016-11-10 LAB — CBC WITH DIFFERENTIAL/PLATELET
Basophils Absolute: 0 10*3/uL (ref 0.0–0.1)
Basophils Relative: 0 %
Eosinophils Absolute: 0.1 10*3/uL (ref 0.0–0.7)
Eosinophils Relative: 1 %
HCT: 36.5 % (ref 36.0–46.0)
Hemoglobin: 12 g/dL (ref 12.0–15.0)
Lymphocytes Relative: 34 %
Lymphs Abs: 2.5 10*3/uL (ref 0.7–4.0)
MCH: 28.7 pg (ref 26.0–34.0)
MCHC: 32.9 g/dL (ref 30.0–36.0)
MCV: 87.3 fL (ref 78.0–100.0)
Monocytes Absolute: 0.6 10*3/uL (ref 0.1–1.0)
Monocytes Relative: 9 %
Neutro Abs: 4.1 10*3/uL (ref 1.7–7.7)
Neutrophils Relative %: 56 %
Platelets: 340 10*3/uL (ref 150–400)
RBC: 4.18 MIL/uL (ref 3.87–5.11)
RDW: 13.7 % (ref 11.5–15.5)
WBC: 7.4 10*3/uL (ref 4.0–10.5)

## 2016-11-10 LAB — BASIC METABOLIC PANEL
Anion gap: 11 (ref 5–15)
BUN: 11 mg/dL (ref 6–20)
CO2: 26 mmol/L (ref 22–32)
Calcium: 8.8 mg/dL — ABNORMAL LOW (ref 8.9–10.3)
Chloride: 103 mmol/L (ref 101–111)
Creatinine, Ser: 0.66 mg/dL (ref 0.44–1.00)
GFR calc Af Amer: >60 mL/min (ref >60)
GFR calc non Af Amer: >60 mL/min (ref >60)
Glucose, Bld: 107 mg/dL — ABNORMAL HIGH (ref 65–99)
Potassium: 3.9 mmol/L (ref 3.5–5.1)
Sodium: 140 mmol/L (ref 135–145)

## 2016-11-10 MED ORDER — PREDNISONE 20 MG PO TABS: 40.0000 mg | ORAL_TABLET | Freq: Every day | ORAL | 0 refills | Status: DC

## 2016-11-10 NOTE — ED Notes (Signed)
Patient transported to MRI 

## 2016-11-10 NOTE — ED Notes (Signed)
Bed: WA09 Expected date:  Expected time:  Means of arrival:  Comments: 

## 2016-11-10 NOTE — Discharge Instructions (Signed)
The CT of your head does not show a clear reason for your facial numbness. Unfortunately, we were unable to obtain a MRI. I suspect that your symptoms are from a peripheral nerve problem and not a stroke. The prescribed anti inflammatory medicaine ay potentially help. Follow-up with neurology otherwise if your symptoms persist.

## 2016-11-10 NOTE — ED Provider Notes (Signed)
[facility] DEPT Provider Note   CSN: [id number] Arrival date & time: 11/10/16  0347     History   Chief Complaint Chief Complaint  Patient presents with  . facial numbness    HPI Janice Terrell is a 42 y.o. female.  HPI  42 year old female with left facial numbness. Onset yesterday. Persistent since then. No other neurologic complaints. Numbness is in the left temporal region, left preauricular area and extending superiorly above the left ear. Not painful. No tinnitus. No change in hearing. No change in visual acuity. No slurred speech. No difficulty swallowing.  Past Medical History:  Diagnosis Date  . Obesity, Class III, BMI 40-49.9 (morbid obesity) ([place]) 04/27/2011    Patient Active Problem List   Diagnosis Date Noted  . Irregular bleeding 11/17/2014  . Obesity, Class III, BMI 40-49.9 (morbid obesity) ([place]) 04/27/2011    Past Surgical History:  Procedure Laterality Date  . ABDOMINAL HYSTERECTOMY    . BREATH TEK H PYLORI  05/19/2011   Procedure: BREATH TEK H PYLORI;  Surgeon: Pedro Earls, MD;  Location: [facility];  Service: General;  Laterality: N/A;  . [place]  . CYSTOSCOPY  11/17/2014   Procedure: CYSTOSCOPY;  Surgeon: Crawford Givens, MD;  Location: [facility];  Service: Gynecology;;  . TUBAL LIGATION      OB History    No data available       Home Medications    Prior to Admission medications   Medication Sig Start Date End Date Taking? Authorizing Provider  ibuprofen (ADVIL,MOTRIN) 200 MG tablet Take 400 mg by mouth every 6 (six) hours as needed for moderate pain.   Yes [provider]  benzonatate (TESSALON) 100 MG capsule Take 1 capsule (100 mg total) by mouth every 8 (eight) hours. Patient not taking: Reported on 11/10/2016 03/01/16   Barnet Glasgow, NP  guaiFENesin-codeine 100-10 MG/5ML syrup Take 5 mLs by mouth 3 (three) times daily as needed for cough. Patient not taking: Reported on 11/10/2016 03/02/16   Ward, Ozella Almond, PA-C  ondansetron (ZOFRAN ODT) 4 MG disintegrating tablet Take 1 tablet (4 mg total) by mouth every 8 (eight) hours as needed for nausea or vomiting. Patient not taking: Reported on 11/10/2016 03/01/16   Barnet Glasgow, NP  oseltamivir (TAMIFLU) 75 MG capsule Take 1 capsule (75 mg total) by mouth every 12 (twelve) hours. Patient not taking: Reported on 11/10/2016 03/01/16   Barnet Glasgow, NP    Family History Family History  Problem Relation Age of Onset  . Diabetes Sister   . Diabetes Maternal Grandmother   . Diabetes Other     Social History Social History  Substance Use Topics  . Smoking status: Never Smoker  . Smokeless tobacco: Never Used  . Alcohol use No     Allergies   Patient has no known allergies.   Review of Systems Review of Systems   All systems reviewed and negative, other than as noted in HPI.   Marland KitchenPhysical Exam Updated Vital Signs BP (!) 149/88 (BP Location: Right Arm)   Pulse 63   Temp 98.1 F (36.7 C) (Oral)   Resp 18   Ht 5\' 3"  (1.6 m)   Wt 127 kg (280 lb)   LMP 08/09/2014   SpO2 100%   BMI 49.60 kg/m   Physical Exam  Constitutional: She is oriented to person, place, and time. She appears well-developed and well-nourished. No distress.  HENT:  Head: Normocephalic and atraumatic.  Eyes: Conjunctivae are normal. Right  eye exhibits no discharge. Left eye exhibits no discharge.  Neck: Neck supple.  Cardiovascular: Normal rate, regular rhythm and normal heart sounds.  Exam reveals no gallop and no friction rub.   No murmur heard. Pulmonary/Chest: Effort normal and breath sounds normal. No respiratory distress.  Abdominal: Soft. She exhibits no distension. There is no tenderness.  Musculoskeletal: She exhibits no edema or tenderness.  Neurological: She is alert and oriented to person, place, and time. No cranial nerve deficit. She exhibits normal muscle tone. Coordination normal.  Sensation is intact to light touch in her face neck  and upper extremity.  Skin: Skin is warm and dry.  Psychiatric: She has a normal mood and affect. Her behavior is normal. Thought content normal.  Nursing note and vitals reviewed.    ED Treatments / Results  Labs (all labs ordered are listed, but only abnormal results are displayed) Labs Reviewed  BASIC METABOLIC PANEL - Abnormal; Notable for the following:       Result Value   Glucose, Bld 107 (*)    Calcium 8.8 (*)    All other components within normal limits  CBC WITH DIFFERENTIAL/PLATELET    EKG  EKG Interpretation None       Radiology Ct Head Wo Contrast  Result Date: 11/10/2016 CLINICAL DATA:  Headache, left facial numbness. EXAM: CT HEAD WITHOUT CONTRAST TECHNIQUE: Contiguous axial images were obtained from the base of the skull through the vertex without intravenous contrast. COMPARISON:  None. FINDINGS: Brain: No mass effect or midline shift is noted. Ventricular size is within normal limits. Focal high density is noted to the left of the fourth ventricle in the cerebellum which may represent focal calcification, but focal contusion or hemorrhage cannot be excluded. No acute infarction or mass lesion is noted. Vascular: No hyperdense vessel or unexpected calcification. Skull: Normal. Negative for fracture or focal lesion. Sinuses/Orbits: No acute finding. Other: None. IMPRESSION: Focal hyperdensity seen in left cerebellar hemisphere near fourth ventricle which may represent focal calcification, but focal hemorrhage cannot be excluded. MRI is recommended for further evaluation. Critical Value/emergent results were called by telephone at the time of interpretation on 11/10/2016 at 8:16 am to Dr. Virgel Manifold , who verbally acknowledged these results. Electronically Signed   By: Marijo Conception, M.D.   On: 11/10/2016 08:16    Procedures Procedures (including critical care time)  Medications Ordered in ED Medications - No data to display   Initial Impression /  Assessment and Plan / ED Course  I have reviewed the triage vital signs and the nursing notes.  Pertinent labs & imaging results that were available during my care of the patient were reviewed by me and considered in my medical decision making (see chart for details).     41 year old female with facial numbness. Really no other symptoms  and distribution suggests a peripheral neuropathy. She actually has normal sensation on palpation. She did have a CT of the head which is not overly sensitive for this type of complaints, but for what is worth, it was negative. I highly doubt CVA or other emergent process. Give her course of anti-inflammatories. Return precautions discussed. Outpatient follow-up otherwise.  Final Clinical Impressions(s) / ED Diagnoses   Final diagnoses:  Facial numbness    New Prescriptions New Prescriptions   No medications on file     Virgel Manifold, MD 11/15/16 1037

## 2016-11-10 NOTE — ED Triage Notes (Signed)
Pt complains of left sided facial numbness since yesterday Pt has symmetrical movements of her face and no other stroke like sx

## 2016-11-10 NOTE — ED Notes (Signed)
PT RETURNED FROM MRI

## 2016-11-10 NOTE — ED Notes (Signed)
Patient transported to CT 

## 2016-11-10 NOTE — ED Notes (Signed)
ED Provider at bedside. 

## 2016-11-14 ENCOUNTER — Emergency Department (HOSPITAL_COMMUNITY)
Admission: EM | Admit: 2016-11-14 | Discharge: 2016-11-14 | Disposition: A | Discharge status: Home / Self Care | Payer: Managed Care, Other (non HMO) | Attending: Emergency Medicine | Admitting: Emergency Medicine

## 2016-11-14 ENCOUNTER — Encounter (HOSPITAL_COMMUNITY): Payer: Self-pay

## 2016-11-14 DIAGNOSIS — G609 Hereditary and idiopathic neuropathy, unspecified: Secondary | ICD-10-CM

## 2016-11-14 DIAGNOSIS — Z7901 Long term (current) use of anticoagulants: Secondary | ICD-10-CM | POA: Insufficient documentation

## 2016-11-14 DIAGNOSIS — Z791 Long term (current) use of non-steroidal anti-inflammatories (NSAID): Secondary | ICD-10-CM | POA: Diagnosis not present

## 2016-11-14 DIAGNOSIS — R200 Anesthesia of skin: Secondary | ICD-10-CM | POA: Diagnosis present

## 2016-11-14 NOTE — ED Triage Notes (Signed)
Patient here to have recheck of ongoing left sided facial numbness and tingling. Seen at [facility] ED for same and has been taking steroids as prescribed. No other associated symptoms. NAD

## 2016-11-14 NOTE — Discharge Instructions (Signed)
Call [facility] for follow up appointment.

## 2016-11-14 NOTE — ED Provider Notes (Signed)
[facility] EMERGENCY DEPARTMENT Provider Note   CSN: [id number] Arrival date & time: 11/14/16  6301     History   Chief Complaint No chief complaint on file.   HPI Janice Terrell is a 41 y.o. female. Chief complaint is facial numbness  HPI 41 year old female. Was seen at [facility] 4 days ago. States that she had some tingling on her forehead above her left eye. Had a negative CT scan. Prednisone.  Symptoms have not improved. She does not wear had her head where anything that would compress a peripheral nerve. She does not have jaw claudication, or amaurosis symptoms. No headaches. No neuralgia symptoms with electrical shocks. No blisters or lesions to suggest zoster. No difficulty with speech, swallowing, hearing, balance, coordination.  Past Medical History:  Diagnosis Date  . Obesity, Class III, BMI 40-49.9 (morbid obesity) ([place]) 04/27/2011    Patient Active Problem List   Diagnosis Date Noted  . Irregular bleeding 11/17/2014  . Obesity, Class III, BMI 40-49.9 (morbid obesity) ([place]) 04/27/2011    Past Surgical History:  Procedure Laterality Date  . ABDOMINAL HYSTERECTOMY    . BREATH TEK H PYLORI  05/19/2011   Procedure: BREATH TEK H PYLORI;  Surgeon: Pedro Earls, MD;  Location: [facility];  Service: General;  Laterality: N/A;  . [place]  . CYSTOSCOPY  11/17/2014   Procedure: CYSTOSCOPY;  Surgeon: Crawford Givens, MD;  Location: [facility];  Service: Gynecology;;  . TUBAL LIGATION      OB History    No data available       Home Medications    Prior to Admission medications   Medication Sig Start Date End Date Taking? Authorizing Provider  aspirin 325 MG EC tablet Take 1,300 mg by mouth daily as needed for pain.    Yes [provider]  ibuprofen (ADVIL,MOTRIN) 200 MG tablet Take 400 mg by mouth every 6 (six) hours as needed for moderate pain.   Yes [provider]  predniSONE (DELTASONE) 20 MG tablet Take 2  tablets (40 mg total) by mouth daily. 11/10/16  Yes Virgel Manifold, MD    Family History Family History  Problem Relation Age of Onset  . Diabetes Sister   . Diabetes Maternal Grandmother   . Diabetes Other     Social History Social History  Substance Use Topics  . Smoking status: Never Smoker  . Smokeless tobacco: Never Used  . Alcohol use No     Allergies   Patient has no known allergies.   Review of Systems Review of Systems  Constitutional: Negative for appetite change, chills, diaphoresis, fatigue and fever.  HENT: Negative for mouth sores, sore throat and trouble swallowing.        Numbness and tingling described as pins and needles in her left for head  Eyes: Negative for visual disturbance.  Respiratory: Negative for cough, chest tightness, shortness of breath and wheezing.   Cardiovascular: Negative for chest pain.  Gastrointestinal: Negative for abdominal distention, abdominal pain, diarrhea, nausea and vomiting.  Endocrine: Negative for polydipsia, polyphagia and polyuria.  Genitourinary: Negative for dysuria, frequency and hematuria.  Musculoskeletal: Negative for gait problem.  Skin: Negative for color change, pallor and rash.  Neurological: Negative for dizziness, syncope, light-headedness and headaches.  Hematological: Does not bruise/bleed easily.  Psychiatric/Behavioral: Negative for behavioral problems and confusion.     Physical Exam Updated Vital Signs BP 122/78 (BP Location: Right Arm)   Pulse 71   Temp 97.8 F (  36.6 C) (Oral)   Resp (!) 22   LMP 08/09/2014   SpO2 99%   Physical Exam  Constitutional: She is oriented to person, place, and time. She appears well-developed and well-nourished. No distress.  HENT:  Head: Normocephalic.    Eyes: Pupils are equal, round, and reactive to light. Conjunctivae are normal. No scleral icterus.  Neck: Normal range of motion. Neck supple. No thyromegaly present.  Cardiovascular: Normal rate and  regular rhythm.  Exam reveals no gallop and no friction rub.   No murmur heard. Pulmonary/Chest: Effort normal and breath sounds normal. No respiratory distress. She has no wheezes. She has no rales.  Abdominal: Soft. Bowel sounds are normal. She exhibits no distension. There is no tenderness. There is no rebound.  Musculoskeletal: Normal range of motion.  Neurological: She is alert and oriented to person, place, and time.    Doubt CVA. Doubt MS or other neurological disorder. Consistent with localized peripheral neuropathy. Patient will be discharged. Neurology follow-up. ER with acute changes. I discussed the case with Dr. Malen Gauze of neurology. I did not ask him to see this patient. Per our discussion he agrees with my initial diagnosis of peripheral neuropathy  Skin: Skin is warm and dry. No rash noted.  Psychiatric: She has a normal mood and affect. Her behavior is normal.     ED Treatments / Results  Labs (all labs ordered are listed, but only abnormal results are displayed) Labs Reviewed - No data to display  EKG  EKG Interpretation None       Radiology No results found.  Procedures Procedures (including critical care time)  Medications Ordered in ED Medications - No data to display   Initial Impression / Assessment and Plan / ED Course  I have reviewed the triage vital signs and the nursing notes.  Pertinent labs & imaging results that were available during my care of the patient were reviewed by me and considered in my medical decision making (see chart for details).    Final height and localized area of probable peripheral neuropathy. No other symptoms to suggest MS, other neurological disorder, CVA. Discussed with Dr. Malen Gauze neurologist on-call. He agrees. Plan outpatient neurological follow-up.  Final Clinical Impressions(s) / ED Diagnoses   Final diagnoses:  Idiopathic peripheral neuropathy    New Prescriptions New Prescriptions   No medications on file       Tanna Furry, MD 11/14/16 1202

## 2016-11-28 ENCOUNTER — Ambulatory Visit
Admit: 2016-11-28 | Discharge: 2016-11-28 | Disposition: A | Discharge status: Home / Self Care | Payer: Managed Care, Other (non HMO) | Attending: Internal Medicine | Admitting: Internal Medicine

## 2016-11-28 DIAGNOSIS — Z1231 Encounter for screening mammogram for malignant neoplasm of breast: Secondary | ICD-10-CM

## 2017-03-11 ENCOUNTER — Other Ambulatory Visit: Payer: Self-pay

## 2017-03-11 ENCOUNTER — Emergency Department (HOSPITAL_COMMUNITY): Payer: Managed Care, Other (non HMO)

## 2017-03-11 ENCOUNTER — Encounter (HOSPITAL_COMMUNITY): Payer: Self-pay | Admitting: Emergency Medicine

## 2017-03-11 ENCOUNTER — Emergency Department (HOSPITAL_COMMUNITY)
Admission: EM | Admit: 2017-03-11 | Discharge: 2017-03-11 | Disposition: A | Discharge status: Home / Self Care | Payer: Managed Care, Other (non HMO) | Attending: Emergency Medicine | Admitting: Emergency Medicine

## 2017-03-11 DIAGNOSIS — Z7982 Long term (current) use of aspirin: Secondary | ICD-10-CM | POA: Insufficient documentation

## 2017-03-11 DIAGNOSIS — N2889 Other specified disorders of kidney and ureter: Secondary | ICD-10-CM | POA: Diagnosis not present

## 2017-03-11 DIAGNOSIS — R1032 Left lower quadrant pain: Secondary | ICD-10-CM | POA: Diagnosis not present

## 2017-03-11 LAB — COMPREHENSIVE METABOLIC PANEL
ALT: 23 U/L (ref 14–54)
AST: 20 U/L (ref 15–41)
Albumin: 3.7 g/dL (ref 3.5–5.0)
Alkaline Phosphatase: 91 U/L (ref 38–126)
Anion gap: 12 (ref 5–15)
BUN: 7 mg/dL (ref 6–20)
CO2: 24 mmol/L (ref 22–32)
Calcium: 9.2 mg/dL (ref 8.9–10.3)
Chloride: 102 mmol/L (ref 101–111)
Creatinine, Ser: 0.61 mg/dL (ref 0.44–1.00)
GFR calc Af Amer: >60 mL/min (ref >60)
GFR calc non Af Amer: >60 mL/min (ref >60)
Glucose, Bld: 115 mg/dL — ABNORMAL HIGH (ref 65–99)
Potassium: 3.8 mmol/L (ref 3.5–5.1)
Sodium: 138 mmol/L (ref 135–145)
Total Bilirubin: 0.5 mg/dL (ref 0.3–1.2)
Total Protein: 7.1 g/dL (ref 6.5–8.1)

## 2017-03-11 LAB — CBC
HCT: 40.9 % (ref 36.0–46.0)
HEMOGLOBIN: 13.2 g/dL (ref 12.0–15.0)
MCH: 28.6 pg (ref 26.0–34.0)
MCHC: 32.3 g/dL (ref 30.0–36.0)
MCV: 88.5 fL (ref 78.0–100.0)
PLATELETS: 349 10*3/uL (ref 150–400)
RBC: 4.62 MIL/uL (ref 3.87–5.11)
RDW: 13.6 % (ref 11.5–15.5)
WBC: 8.3 10*3/uL (ref 4.0–10.5)

## 2017-03-11 LAB — URINALYSIS, ROUTINE W REFLEX MICROSCOPIC
Bilirubin Urine: NEGATIVE
Glucose, UA: NEGATIVE mg/dL
Hgb urine dipstick: NEGATIVE
Ketones, ur: NEGATIVE mg/dL
Leukocytes, UA: NEGATIVE
NITRITE: NEGATIVE
PROTEIN: NEGATIVE mg/dL
SPECIFIC GRAVITY, URINE: 1.013 (ref 1.005–1.030)
pH: 6 (ref 5.0–8.0)

## 2017-03-11 LAB — I-STAT BETA HCG BLOOD, ED (MC, WL, AP ONLY)

## 2017-03-11 LAB — LIPASE, BLOOD: Lipase: 29 U/L (ref 11–51)

## 2017-03-11 MED ORDER — MORPHINE SULFATE (PF) 4 MG/ML IV SOLN
4.0000 mg | Freq: Once | INTRAVENOUS | Status: AC
  Administered 2017-03-11: 4 mg via INTRAVENOUS
  Filled 2017-03-11: qty 1

## 2017-03-11 MED ORDER — ONDANSETRON HCL 4 MG/2ML IJ SOLN
4.0000 mg | Freq: Once | INTRAMUSCULAR | Status: AC
Start: 2017-03-11 — End: 2017-03-11
  Administered 2017-03-11: 4 mg via INTRAVENOUS
  Filled 2017-03-11: qty 2

## 2017-03-11 MED ORDER — KETOROLAC TROMETHAMINE 30 MG/ML IJ SOLN
15.0000 mg | Freq: Once | INTRAMUSCULAR | Status: AC
Start: 2017-03-11 — End: 2017-03-11
  Administered 2017-03-11: 15 mg via INTRAVENOUS
  Filled 2017-03-11: qty 1

## 2017-03-11 MED ORDER — SODIUM CHLORIDE 0.9 % IV BOLUS (SEPSIS)
1000.0000 mL | Freq: Once | INTRAVENOUS | Status: AC
  Administered 2017-03-11: 1000 mL via INTRAVENOUS

## 2017-03-11 MED ORDER — IOPAMIDOL (ISOVUE-300) INJECTION 61%
INTRAVENOUS | Status: AC
  Administered 2017-03-11: 100 mL via INTRAVENOUS
  Filled 2017-03-11: qty 100

## 2017-03-11 MED ORDER — TRAMADOL HCL 50 MG PO TABS: 50.0000 mg | ORAL_TABLET | Freq: Four times a day (QID) | ORAL | 0 refills | Status: DC | PRN

## 2017-03-11 NOTE — ED Provider Notes (Signed)
[facility] EMERGENCY DEPARTMENT Provider Note   CSN: [id number] Arrival date & time: 03/11/17  1320     History   Chief Complaint Chief Complaint  Patient presents with  . Abdominal Pain    HPI Janice Terrell is a 42 y.o. female.  HPI 42 year old African-American female pmh sig for obesity and prior partial abdominal hysterectomy presents to the emergency department today with complaints of acute onset of left lower quadrant abdominal pain.  Patient states that she felt some pain in the left lower quadrant last night however it worsened this morning.  Describes the pain as sharp and stabbing in nature.  It radiates to her left flank.  The pain is constant and is not relieved by any over-the-counter medication including Motrin.  Patient states nothing makes better or worse.  She denies any associated urinary symptoms, vaginal bleeding, vaginal discharge, fevers, chills, nausea, emesis, change in bowel habits, melena or hematochezia.  No history of same.  Patient reports history of prior partial hysterectomy but no other abdominal surgeries.  Last bowel movement was last night.  Patient is sexually active with one female partner and denies any concern for STD.  Patient has no history of ovarian cyst.  Pt denies any fever, chill, ha, vision changes, lightheadedness, dizziness, congestion, neck pain, cp, sob, cough,  n/v/d, urinary symptoms, change in bowel habits, melena, hematochezia, lower extremity paresthesias.  Past Medical History:  Diagnosis Date  . Obesity, Class III, BMI 40-49.9 (morbid obesity) ([place]) 04/27/2011    Patient Active Problem List   Diagnosis Date Noted  . Irregular bleeding 11/17/2014  . Obesity, Class III, BMI 40-49.9 (morbid obesity) ([place]) 04/27/2011    Past Surgical History:  Procedure Laterality Date  . ABDOMINAL HYSTERECTOMY    . BREATH TEK H PYLORI  05/19/2011   Procedure: BREATH TEK H PYLORI;  Surgeon: Pedro Earls, MD;  Location:  [facility];  Service: General;  Laterality: N/A;  . [place]  . CYSTOSCOPY  11/17/2014   Procedure: CYSTOSCOPY;  Surgeon: Crawford Givens, MD;  Location: [facility];  Service: Gynecology;;  . TUBAL LIGATION      OB History    No data available       Home Medications    Prior to Admission medications   Medication Sig Start Date End Date Taking? Authorizing Provider  aspirin 325 MG EC tablet Take 1,300 mg by mouth daily as needed for pain.     [provider]  ibuprofen (ADVIL,MOTRIN) 200 MG tablet Take 400 mg by mouth every 6 (six) hours as needed for moderate pain.    [provider]  predniSONE (DELTASONE) 20 MG tablet Take 2 tablets (40 mg total) by mouth daily. 11/10/16   Virgel Manifold, MD    Family History Family History  Problem Relation Age of Onset  . Diabetes Sister   . Diabetes Maternal Grandmother   . Diabetes Other     Social History Social History   Tobacco Use  . Smoking status: Never Smoker  . Smokeless tobacco: Never Used  Substance Use Topics  . Alcohol use: No  . Drug use: No     Allergies   Patient has no known allergies.   Review of Systems Review of Systems  Constitutional: Negative for chills and fever.  HENT: Negative for congestion.   Eyes: Negative for visual disturbance.  Respiratory: Negative for cough and shortness of breath.   Cardiovascular: Negative for chest pain.  Gastrointestinal: Positive for  abdominal pain. Negative for blood in stool, constipation, diarrhea, nausea and vomiting.  Genitourinary: Positive for flank pain. Negative for dysuria, frequency, hematuria, urgency, vaginal bleeding and vaginal discharge.  Musculoskeletal: Negative for arthralgias and myalgias.  Skin: Negative for rash.  Neurological: Negative for dizziness, syncope, weakness, light-headedness, numbness and headaches.  Psychiatric/Behavioral: Negative for sleep disturbance. The patient is not nervous/anxious.       Physical Exam Updated Vital Signs BP (!) 166/92   Pulse 60   Temp 98.5 F (36.9 C) (Oral)   Resp 16   Ht 5\' 2"  (1.575 m)   Wt 127 kg (280 lb)   LMP 08/09/2014   SpO2 100%   BMI 51.21 kg/m   Physical Exam  Constitutional: She is oriented to person, place, and time. She appears well-developed and well-nourished.  Non-toxic appearance. No distress.  HENT:  Head: Normocephalic and atraumatic.  Nose: Nose normal.  Mouth/Throat: Oropharynx is clear and moist.  Eyes: Conjunctivae are normal. Pupils are equal, round, and reactive to light. Right eye exhibits no discharge. Left eye exhibits no discharge.  Neck: Normal range of motion. Neck supple.  Cardiovascular: Normal rate, regular rhythm, normal heart sounds and intact distal pulses. Exam reveals no gallop and no friction rub.  No murmur heard. Pulmonary/Chest: Effort normal and breath sounds normal. No stridor. No respiratory distress. She has no wheezes. She has no rales. She exhibits no tenderness.  Abdominal: Soft. Bowel sounds are normal. There is tenderness (mild) in the suprapubic area and left lower quadrant. There is no rigidity, no rebound, no guarding, no CVA tenderness, no tenderness at McBurney's point and negative Murphy's sign.  Musculoskeletal: Normal range of motion. She exhibits no tenderness.  Lymphadenopathy:    She has no cervical adenopathy.  Neurological: She is alert and oriented to person, place, and time.  Skin: Skin is warm and dry. Capillary refill takes less than 2 seconds. No rash noted.  Psychiatric: Her behavior is normal. Judgment and thought content normal.  Nursing note and vitals reviewed.    ED Treatments / Results  Labs (all labs ordered are listed, but only abnormal results are displayed) Labs Reviewed  COMPREHENSIVE METABOLIC PANEL - Abnormal; Notable for the following components:      Result Value   Glucose, Bld 115 (*)    All other components within normal limits  LIPASE, BLOOD   CBC  URINALYSIS, ROUTINE W REFLEX MICROSCOPIC  I-STAT BETA HCG BLOOD, ED ([facility], [facility], [facility] ONLY)    EKG  EKG Interpretation None       Radiology No results found.  Procedures Procedures (including critical care time)  Medications Ordered in ED Medications  iopamidol (ISOVUE-300) 61 % injection (not administered)  sodium chloride 0.9 % bolus 1,000 mL (1,000 mLs Intravenous New Bag/Given 03/11/17 1445)  morphine 4 MG/ML injection 4 mg (4 mg Intravenous Given 03/11/17 1445)  ondansetron (ZOFRAN) injection 4 mg (4 mg Intravenous Given 03/11/17 1445)     Initial Impression / Assessment and Plan / ED Course  I have reviewed the triage vital signs and the nursing notes.  Pertinent labs & imaging results that were available during my care of the patient were reviewed by me and considered in my medical decision making (see chart for details).     Patient is nontoxic, nonseptic appearing, in no apparent distress.  Patient's pain and other symptoms adequately managed in emergency department.  Fluid bolus given.  Labs, imaging and vitals reviewed.  Patient without leukocytosis.  Liver enzymes are  normal.  Normal lipase.  UA shows no signs of infection.  Patient does not meet the SIRS or Sepsis criteria.  CT scan shows no acute abnormalities that would explain patient's symptoms.  Does note a right renal mass that recommends outpatient MRI for follow-up.  Ultrasound performed shows no signs of ovarian torsion or ovarian cyst.  On repeat exam patient does not have a surgical abdomin and there are no peritoneal signs.  Patient is pain adequately managed in the ED today.  No indication of appendicitis, bowel obstruction, bowel perforation, cholecystitis, diverticulitis, PID or ectopic pregnancy.  Patient discharged home with symptomatic treatment and given strict instructions for follow-up with their primary care physician.  Pt is hemodynamically stable, in NAD, & able to ambulate in the ED. Evaluation  does not show pathology that would require ongoing emergent intervention or inpatient treatment. I explained the diagnosis to the patient. Pain has been managed & has no complaints prior to dc. Pt is comfortable with above plan and is stable for discharge at this time. All questions were answered prior to disposition. Strict return precautions for f/u to the ED were discussed. Encouraged follow up with PCP.      Final Clinical Impressions(s) / ED Diagnoses   Final diagnoses:  Left lower quadrant pain  Renal mass    ED Discharge Orders        Ordered    traMADol (ULTRAM) 50 MG tablet  Every 6 hours PRN     03/11/17 1752       Doristine Devoid, PA-C 03/11/17 1757    Marcha Dutton Forbes Cellar, MD 03/11/17 1801

## 2017-03-11 NOTE — ED Notes (Signed)
Patient transported to Ultrasound 

## 2017-03-11 NOTE — ED Notes (Signed)
States took ibuprofen at home with no relief.

## 2017-03-11 NOTE — ED Notes (Signed)
Patient transported to CT 

## 2017-03-11 NOTE — ED Triage Notes (Signed)
Pt to ER for evaluation of LLQ abdominal pain, intermittently sharp per patient. States pain with urination. States pain is radiating into left flank. States onset this morning. Denies n/v.

## 2017-03-11 NOTE — Discharge Instructions (Signed)
Your workup has been reassuring in the ED.  Unknown cause of your symptoms.  May take Motrin and Tylenol for pain and apply warm compresses to the area.  Have given you short course of pain medication to take if not controlled by Motrin and Tylenol however I would take this medicine at night as it will make you drowsy.  Make sure you follow-up with your primary care doctor for further workup of your symptoms return to the ED with any worsening symptoms.

## 2017-04-19 ENCOUNTER — Ambulatory Visit (HOSPITAL_COMMUNITY)
Admission: EM | Admit: 2017-04-19 | Discharge: 2017-04-19 | Disposition: A | Discharge status: Home / Self Care | Payer: Managed Care, Other (non HMO) | Attending: Family Medicine | Admitting: Family Medicine

## 2017-04-19 ENCOUNTER — Other Ambulatory Visit: Payer: Self-pay

## 2017-04-19 ENCOUNTER — Encounter (HOSPITAL_COMMUNITY): Payer: Self-pay | Admitting: Emergency Medicine

## 2017-04-19 DIAGNOSIS — Z79891 Long term (current) use of opiate analgesic: Secondary | ICD-10-CM | POA: Diagnosis not present

## 2017-04-19 DIAGNOSIS — R05 Cough: Secondary | ICD-10-CM | POA: Insufficient documentation

## 2017-04-19 DIAGNOSIS — Z7982 Long term (current) use of aspirin: Secondary | ICD-10-CM | POA: Diagnosis not present

## 2017-04-19 DIAGNOSIS — Z9851 Tubal ligation status: Secondary | ICD-10-CM | POA: Insufficient documentation

## 2017-04-19 DIAGNOSIS — J029 Acute pharyngitis, unspecified: Secondary | ICD-10-CM | POA: Diagnosis not present

## 2017-04-19 DIAGNOSIS — J069 Acute upper respiratory infection, unspecified: Secondary | ICD-10-CM

## 2017-04-19 DIAGNOSIS — Z90710 Acquired absence of both cervix and uterus: Secondary | ICD-10-CM | POA: Diagnosis not present

## 2017-04-19 DIAGNOSIS — B9789 Other viral agents as the cause of diseases classified elsewhere: Secondary | ICD-10-CM

## 2017-04-19 LAB — POCT RAPID STREP A: Streptococcus, Group A Screen (Direct): NEGATIVE

## 2017-04-19 MED ORDER — HYDROCODONE-HOMATROPINE 5-1.5 MG/5ML PO SYRP: 5.0000 mL | ORAL_SOLUTION | Freq: Four times a day (QID) | ORAL | 0 refills | Status: DC | PRN

## 2017-04-19 MED ORDER — FLUTICASONE PROPIONATE 50 MCG/ACT NA SUSP: 1.0000 | Freq: Every day | NASAL | 0 refills | Status: DC

## 2017-04-19 NOTE — ED Triage Notes (Signed)
C/o sore throat, cough, rhinitis and HA "since had flu 3 weeks ago"

## 2017-04-19 NOTE — ED Provider Notes (Signed)
[facility]    CSN: [id number] Arrival date & time: 04/19/17  1307     History   Chief Complaint Chief Complaint  Patient presents with  . Sore Throat    HPI BROOKELYN GAYNOR is a 42 y.o. female.   42 year old female, with no significant past medical history, presenting today due to sore throat.  Patient states that she was diagnosed with the flu about 3 weeks ago.  States that she has had sore throat since that time.  States that she has some postnasal drip that she believes may be exacerbating her sore throat.  She has had a continued nonproductive cough.  She had no fever or chills.  Denies any chest pain or shortness of breath.  The history is provided by the patient.  Sore Throat  This is a new problem. The current episode started more than 1 week ago. The problem occurs constantly. The problem has not changed since onset.Pertinent negatives include no chest pain, no abdominal pain, no headaches and no shortness of breath. Nothing aggravates the symptoms. Nothing relieves the symptoms. She has tried nothing for the symptoms. The treatment provided no relief.    Past Medical History:  Diagnosis Date  . Obesity, Class III, BMI 40-49.9 (morbid obesity) ([place]) 04/27/2011    Patient Active Problem List   Diagnosis Date Noted  . Irregular bleeding 11/17/2014  . Obesity, Class III, BMI 40-49.9 (morbid obesity) ([place]) 04/27/2011    Past Surgical History:  Procedure Laterality Date  . ABDOMINAL HYSTERECTOMY    . BREATH TEK H PYLORI  05/19/2011   Procedure: BREATH TEK H PYLORI;  Surgeon: Pedro Earls, MD;  Location: [facility];  Service: General;  Laterality: N/A;  . [place]  . CYSTOSCOPY  11/17/2014   Procedure: CYSTOSCOPY;  Surgeon: Crawford Givens, MD;  Location: [facility];  Service: Gynecology;;  . TUBAL LIGATION      OB History   None      Home Medications    Prior to Admission medications   Medication Sig Start Date End Date Taking?  Authorizing Provider  aspirin 325 MG EC tablet Take 1,300 mg by mouth daily as needed for pain.     [provider]  fluticasone (FLONASE) 50 MCG/ACT nasal spray Place 1 spray into both nostrils daily for 10 days. 04/19/17 04/29/17  Aarin Bluett C, PA-C  HYDROcodone-homatropine (HYCODAN) 5-1.5 MG/5ML syrup Take 5 mLs by mouth every 6 (six) hours as needed for cough. 04/19/17   Zhara Gieske C, PA-C  ibuprofen (ADVIL,MOTRIN) 200 MG tablet Take 400 mg by mouth every 6 (six) hours as needed for moderate pain.    [provider]  predniSONE (DELTASONE) 20 MG tablet Take 2 tablets (40 mg total) by mouth daily. 11/10/16   Virgel Manifold, MD  traMADol (ULTRAM) 50 MG tablet Take 1 tablet (50 mg total) by mouth every 6 (six) hours as needed. 03/11/17   Doristine Devoid, PA-C    Family History Family History  Problem Relation Age of Onset  . Diabetes Sister   . Diabetes Maternal Grandmother   . Diabetes Other     Social History Social History   Tobacco Use  . Smoking status: Never Smoker  . Smokeless tobacco: Never Used  Substance Use Topics  . Alcohol use: No  . Drug use: No     Allergies   Patient has no known allergies.   Review of Systems Review of Systems  Constitutional: Negative for chills  and fever.  HENT: Positive for congestion, postnasal drip and sore throat. Negative for ear pain.   Eyes: Negative for pain and visual disturbance.  Respiratory: Positive for cough. Negative for shortness of breath.   Cardiovascular: Negative for chest pain and palpitations.  Gastrointestinal: Negative for abdominal pain and vomiting.  Genitourinary: Negative for dysuria and hematuria.  Musculoskeletal: Negative for arthralgias and back pain.  Skin: Negative for color change and rash.  Neurological: Negative for seizures, syncope and headaches.  All other systems reviewed and are negative.    Physical Exam Triage Vital Signs ED Triage Vitals  Enc Vitals Group     BP  04/19/17 1400 (!) 149/91     Pulse Rate 04/19/17 1400 72     Resp --      Temp 04/19/17 1400 98.3 F (36.8 C)     Temp Source 04/19/17 1400 Oral     SpO2 04/19/17 1400 100 %     Weight --      Height --      Head Circumference --      Peak Flow --      Pain Score 04/19/17 1358 5     Pain Loc --      Pain Edu? --      Excl. in [place]? --    No data found.  Updated Vital Signs BP (!) 149/91 (BP Location: Left Arm)   Pulse 72   Temp 98.3 F (36.8 C) (Oral)   LMP 08/09/2014   SpO2 100%   Visual Acuity Right Eye Distance:   Left Eye Distance:   Bilateral Distance:    Right Eye Near:   Left Eye Near:    Bilateral Near:     Physical Exam  Constitutional: She appears well-developed and well-nourished. No distress.  HENT:  Head: Normocephalic and atraumatic.  Mouth/Throat: No oropharyngeal exudate, posterior oropharyngeal edema, posterior oropharyngeal erythema or tonsillar abscesses.  Eyes: Conjunctivae are normal.  Neck: Neck supple.  Cardiovascular: Normal rate and regular rhythm.  No murmur heard. Pulmonary/Chest: Effort normal and breath sounds normal. No stridor. No respiratory distress. She has no decreased breath sounds. She has no wheezes. She has no rhonchi. She has no rales.  Abdominal: Soft. There is no tenderness.  Musculoskeletal: She exhibits no edema.  Neurological: She is alert.  Skin: Skin is warm and dry.  Psychiatric: She has a normal mood and affect.  Nursing note and vitals reviewed.    UC Treatments / Results  Labs (all labs ordered are listed, but only abnormal results are displayed) Labs Reviewed  CULTURE, GROUP A STREP [facility])  POCT RAPID STREP A    EKG  EKG Interpretation None       Radiology No results found.  Procedures Procedures (including critical care time)  Medications Ordered in UC Medications - No data to display   Initial Impression / Assessment and Plan / UC Course  I have reviewed the triage vital signs and the  nursing notes.  Pertinent labs & imaging results that were available during my care of the patient were reviewed by me and considered in my medical decision making (see chart for details).     Sore throat for the past 3 weeks after being diagnosed with influenza.  Patient does have postnasal drip which is likely the source of her sore throat.  She does complain of ongoing cough that is worse at night.  Patient given Hycodan for use at night as well as Flonase to help with  her postnasal drip and sore throat.  Final Clinical Impressions(s) / UC Diagnoses   Final diagnoses:  Viral URI with cough  Pharyngitis, unspecified etiology    ED Discharge Orders        Ordered    fluticasone (FLONASE) 50 MCG/ACT nasal spray  Daily     04/19/17 1423    HYDROcodone-homatropine (HYCODAN) 5-1.5 MG/5ML syrup  Every 6 hours PRN     04/19/17 1423       Controlled Substance Prescriptions [place] Controlled Substance Registry consulted? Yes, I have consulted the [place] Controlled Substances Registry for this patient, and feel the risk/benefit ratio today is favorable for proceeding with this prescription for a controlled substance.   Phebe Colla, [place] 04/19/17 1431

## 2017-04-21 LAB — CULTURE, GROUP A STREP (THRC)

## 2017-10-19 ENCOUNTER — Other Ambulatory Visit: Payer: Self-pay | Admitting: Internal Medicine

## 2017-10-19 DIAGNOSIS — Z1231 Encounter for screening mammogram for malignant neoplasm of breast: Secondary | ICD-10-CM

## 2017-11-30 ENCOUNTER — Ambulatory Visit
Admission: RE | Admit: 2017-11-30 | Discharge: 2017-11-30 | Disposition: A | Discharge status: Home / Self Care | Payer: Managed Care, Other (non HMO) | Source: Ambulatory Visit | Attending: Internal Medicine | Admitting: Internal Medicine

## 2017-11-30 DIAGNOSIS — Z1231 Encounter for screening mammogram for malignant neoplasm of breast: Secondary | ICD-10-CM

## 2017-12-03 ENCOUNTER — Other Ambulatory Visit: Payer: Self-pay | Admitting: Internal Medicine

## 2017-12-03 DIAGNOSIS — R928 Other abnormal and inconclusive findings on diagnostic imaging of breast: Secondary | ICD-10-CM

## 2017-12-05 ENCOUNTER — Other Ambulatory Visit: Payer: Self-pay | Admitting: Obstetrics and Gynecology

## 2017-12-05 DIAGNOSIS — R928 Other abnormal and inconclusive findings on diagnostic imaging of breast: Secondary | ICD-10-CM

## 2017-12-07 ENCOUNTER — Ambulatory Visit
Admission: RE | Admit: 2017-12-07 | Discharge: 2017-12-07 | Disposition: A | Discharge status: Home / Self Care | Payer: Managed Care, Other (non HMO) | Source: Ambulatory Visit | Attending: Obstetrics and Gynecology | Admitting: Obstetrics and Gynecology

## 2017-12-07 ENCOUNTER — Ambulatory Visit: Payer: Managed Care, Other (non HMO)

## 2017-12-07 DIAGNOSIS — R928 Other abnormal and inconclusive findings on diagnostic imaging of breast: Secondary | ICD-10-CM

## 2018-03-25 ENCOUNTER — Other Ambulatory Visit: Payer: Self-pay

## 2018-03-25 ENCOUNTER — Ambulatory Visit (HOSPITAL_COMMUNITY)
Admission: EM | Admit: 2018-03-25 | Discharge: 2018-03-25 | Disposition: A | Discharge status: Home / Self Care | Payer: Managed Care, Other (non HMO) | Attending: Physician Assistant | Admitting: Physician Assistant

## 2018-03-25 ENCOUNTER — Encounter (HOSPITAL_COMMUNITY): Payer: Self-pay

## 2018-03-25 DIAGNOSIS — M79605 Pain in left leg: Secondary | ICD-10-CM | POA: Diagnosis not present

## 2018-03-25 MED ORDER — PREDNISONE 50 MG PO TABS: 50.0000 mg | ORAL_TABLET | Freq: Every day | ORAL | 0 refills | Status: DC

## 2018-03-25 MED ORDER — KETOROLAC TROMETHAMINE 30 MG/ML IJ SOLN
30.0000 mg | Freq: Once | INTRAMUSCULAR | Status: AC
  Administered 2018-03-25: 30 mg via INTRAMUSCULAR

## 2018-03-25 MED ORDER — KETOROLAC TROMETHAMINE 30 MG/ML IJ SOLN
INTRAMUSCULAR | Status: AC
  Filled 2018-03-25: qty 1

## 2018-03-25 NOTE — ED Triage Notes (Signed)
Pt cc leg pain x 2 weeks. Pt states it sharp and aching pain.

## 2018-03-25 NOTE — ED Provider Notes (Signed)
[facility]    CSN: [id number] Arrival date & time: 03/25/18  0808     History   Chief Complaint Chief Complaint  Patient presents with  . Leg Pain    HPI Janice Terrell is a 43 y.o. female.   43 year old female comes in for 2 week history of left leg pain. Denies injury/trauma. Pain is intermittent, worse with walking/standing. Denies swelling, numbness/tingling. Has been taking ibuprofen and another pain medicine without relief. Denies long travels, hormone use, personal or family history of blood clots.      Past Medical History:  Diagnosis Date  . Obesity, Class III, BMI 40-49.9 (morbid obesity) ([place]) 04/27/2011    Patient Active Problem List   Diagnosis Date Noted  . Irregular bleeding 11/17/2014  . Obesity, Class III, BMI 40-49.9 (morbid obesity) ([place]) 04/27/2011    Past Surgical History:  Procedure Laterality Date  . ABDOMINAL HYSTERECTOMY    . BREATH TEK H PYLORI  05/19/2011   Procedure: BREATH TEK H PYLORI;  Surgeon: Pedro Earls, MD;  Location: [facility];  Service: General;  Laterality: N/A;  . [place]  . CYSTOSCOPY  11/17/2014   Procedure: CYSTOSCOPY;  Surgeon: Crawford Givens, MD;  Location: [facility];  Service: Gynecology;;  . TUBAL LIGATION      OB History   No obstetric history on file.      Home Medications    Prior to Admission medications   Medication Sig Start Date End Date Taking? Authorizing Provider  aspirin 325 MG EC tablet Take 1,300 mg by mouth daily as needed for pain.     [provider]  fluticasone (FLONASE) 50 MCG/ACT nasal spray Place 1 spray into both nostrils daily for 10 days. 04/19/17 04/29/17  Blue, Olivia C, PA-C  ibuprofen (ADVIL,MOTRIN) 200 MG tablet Take 400 mg by mouth every 6 (six) hours as needed for moderate pain.    [provider]  predniSONE (DELTASONE) 50 MG tablet Take 1 tablet (50 mg total) by mouth daily. 03/25/18   Ok Edwards, PA-C    Family History Family  History  Problem Relation Age of Onset  . Diabetes Sister   . Diabetes Maternal Grandmother   . Diabetes Other     Social History Social History   Tobacco Use  . Smoking status: Never Smoker  . Smokeless tobacco: Never Used  Substance Use Topics  . Alcohol use: No  . Drug use: No     Allergies   Patient has no known allergies.   Review of Systems Review of Systems  Reason unable to perform ROS: See HPI as above.     Physical Exam Triage Vital Signs ED Triage Vitals  Enc Vitals Group     BP 03/25/18 0909 (!) 153/95     Pulse Rate 03/25/18 0909 70     Resp 03/25/18 0909 15     Temp 03/25/18 0909 97.6 F (36.4 C)     Temp Source 03/25/18 0909 Oral     SpO2 03/25/18 0909 100 %     Weight 03/25/18 0908 290 lb (131.5 kg)     Height --      Head Circumference --      Peak Flow --      Pain Score 03/25/18 0907 6     Pain Loc --      Pain Edu? --      Excl. in [place]? --    No data found.  Updated  Vital Signs BP (!) 153/95 (BP Location: Right Arm)   Pulse 70   Temp 97.6 F (36.4 C) (Oral)   Resp 15   Wt 290 lb (131.5 kg)   LMP 08/09/2014   SpO2 100%   BMI 53.04 kg/m   Physical Exam Constitutional:      General: She is not in acute distress.    Appearance: She is well-developed. She is not ill-appearing, toxic-appearing or diaphoretic.  HENT:     Head: Normocephalic and atraumatic.  Eyes:     Conjunctiva/sclera: Conjunctivae normal.     Pupils: Pupils are equal, round, and reactive to light.  Musculoskeletal:     Comments: No swelling, erythema, warmth, contusion seen. Left dorsal toe with well healed scar from old bunion surgery. No tenderness to palpation of the knee, lower leg, ankle, foot. Full ROM. Strength normal and equal bilaterally. Sensation intact and equal bilaterally. Pedal pulse 2+, cap refill <2s  Skin:    General: Skin is warm and dry.  Neurological:     Mental Status: She is alert and oriented to person, place, and time.      UC  Treatments / Results  Labs (all labs ordered are listed, but only abnormal results are displayed) Labs Reviewed - No data to display  EKG None  Radiology No results found.  Procedures Procedures (including critical care time)  Medications Ordered in UC Medications  ketorolac (TORADOL) 30 MG/ML injection 30 mg (has no administration in time range)    Initial Impression / Assessment and Plan / UC Course  I have reviewed the triage vital signs and the nursing notes.  Pertinent labs & imaging results that were available during my care of the patient were reviewed by me and considered in my medical decision making (see chart for details).    Toradol injection in office today. Start prednisone for possible inflammatory causes of symptoms. Elevation and rest. Return precautions given.   Final Clinical Impressions(s) / UC Diagnoses   Final diagnoses:  Left leg pain    ED Prescriptions    Medication Sig Dispense Auth. Provider   predniSONE (DELTASONE) 50 MG tablet Take 1 tablet (50 mg total) by mouth daily. 5 tablet Tobin Chad, [place] 03/25/18 [phone]

## 2018-03-25 NOTE — Discharge Instructions (Signed)
Toradol injection in office today. Start prednisone as directed for possible inflammation causing symptoms. Elevation, rest. Follow up with orthopedics for further evaluation if symptoms not improving. If experiencing one sided leg swelling, warmth, shortness of breath, chest pain, go to the emergency department for further evaluation needed.

## 2018-06-03 ENCOUNTER — Emergency Department (HOSPITAL_BASED_OUTPATIENT_CLINIC_OR_DEPARTMENT_OTHER)
Admission: EM | Admit: 2018-06-03 | Discharge: 2018-06-03 | Disposition: A | Discharge status: Home / Self Care | Payer: Managed Care, Other (non HMO) | Attending: Emergency Medicine | Admitting: Emergency Medicine

## 2018-06-03 ENCOUNTER — Encounter (HOSPITAL_BASED_OUTPATIENT_CLINIC_OR_DEPARTMENT_OTHER): Payer: Self-pay

## 2018-06-03 ENCOUNTER — Other Ambulatory Visit: Payer: Self-pay

## 2018-06-03 ENCOUNTER — Emergency Department (HOSPITAL_BASED_OUTPATIENT_CLINIC_OR_DEPARTMENT_OTHER): Payer: Managed Care, Other (non HMO)

## 2018-06-03 DIAGNOSIS — R002 Palpitations: Secondary | ICD-10-CM | POA: Diagnosis present

## 2018-06-03 DIAGNOSIS — Z79899 Other long term (current) drug therapy: Secondary | ICD-10-CM | POA: Diagnosis not present

## 2018-06-03 DIAGNOSIS — R0602 Shortness of breath: Secondary | ICD-10-CM | POA: Insufficient documentation

## 2018-06-03 LAB — COMPREHENSIVE METABOLIC PANEL
ALT: 24 U/L (ref 0–44)
AST: 22 U/L (ref 15–41)
Albumin: 3.9 g/dL (ref 3.5–5.0)
Alkaline Phosphatase: 97 U/L (ref 38–126)
Anion gap: 8 (ref 5–15)
BUN: 11 mg/dL (ref 6–20)
CO2: 24 mmol/L (ref 22–32)
Calcium: 8.7 mg/dL — ABNORMAL LOW (ref 8.9–10.3)
Chloride: 103 mmol/L (ref 98–111)
Creatinine, Ser: 0.71 mg/dL (ref 0.44–1.00)
GFR calc Af Amer: >60 mL/min (ref >60)
GFR calc non Af Amer: >60 mL/min (ref >60)
Glucose, Bld: 104 mg/dL — ABNORMAL HIGH (ref 70–99)
Potassium: 3.6 mmol/L (ref 3.5–5.1)
Sodium: 135 mmol/L (ref 135–145)
Total Bilirubin: 0.5 mg/dL (ref 0.3–1.2)
Total Protein: 7.4 g/dL (ref 6.5–8.1)

## 2018-06-03 LAB — CBC WITH DIFFERENTIAL/PLATELET
Abs Immature Granulocytes: 0.01 10*3/uL (ref 0.00–0.07)
Basophils Absolute: 0 10*3/uL (ref 0.0–0.1)
Basophils Relative: 0 %
Eosinophils Absolute: 0 10*3/uL (ref 0.0–0.5)
Eosinophils Relative: 0 %
HCT: 38.7 % (ref 36.0–46.0)
Hemoglobin: 12.4 g/dL (ref 12.0–15.0)
Immature Granulocytes: 0 %
Lymphocytes Relative: 45 %
Lymphs Abs: 1.7 10*3/uL (ref 0.7–4.0)
MCH: 28 pg (ref 26.0–34.0)
MCHC: 32 g/dL (ref 30.0–36.0)
MCV: 87.4 fL (ref 80.0–100.0)
Monocytes Absolute: 0.4 10*3/uL (ref 0.1–1.0)
Monocytes Relative: 11 %
Neutro Abs: 1.7 10*3/uL (ref 1.7–7.7)
Neutrophils Relative %: 44 %
Platelets: 296 10*3/uL (ref 150–400)
RBC: 4.43 MIL/uL (ref 3.87–5.11)
RDW: 13.4 % (ref 11.5–15.5)
WBC: 3.8 10*3/uL — ABNORMAL LOW (ref 4.0–10.5)
nRBC: 0 % (ref 0.0–0.2)

## 2018-06-03 LAB — TROPONIN I: Troponin I: <0.03 ng/mL (ref <0.03)

## 2018-06-03 LAB — D-DIMER, QUANTITATIVE: D-Dimer, Quant: 0.45 ug/mL-FEU (ref 0.00–0.50)

## 2018-06-03 NOTE — ED Provider Notes (Signed)
[facility] Provider Note   CSN: [id number] Arrival date & time: 06/03/18  1947    History   Chief Complaint Chief Complaint  Patient presents with   Palpitations    HPI Janice Terrell is a 43 y.o. female.     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  Coworker died from blood clots last week and when symptoms started concernedabout this     Past Medical History:  Diagnosis Date   Obesity, Class III, BMI 40-49.9 (morbid obesity) ([place]) 04/27/2011    Patient Active Problem List   Diagnosis Date Noted   Irregular bleeding 11/17/2014   Obesity, Class III, BMI 40-49.9 (morbid obesity) ([place]) 04/27/2011    Past Surgical History:  Procedure Laterality Date   ABDOMINAL HYSTERECTOMY     BREATH TEK H PYLORI  05/19/2011   Procedure: BREATH TEK H PYLORI;  Surgeon: Pedro Earls, MD;  Location: [facility];  Service: General;  Laterality: N/A;   [place]  11/17/2014   Procedure: CYSTOSCOPY;  Surgeon: Crawford Givens, MD;  Location: [facility];  Service: Gynecology;;   TUBAL LIGATION       OB History   No obstetric history on file.      Home Medications    Prior to Admission medications   Medication Sig Start Date End Date Taking? Authorizing  Provider  aspirin 325 MG EC tablet Take 1,300 mg by mouth daily as needed for pain.     [provider]  fluticasone (FLONASE) 50 MCG/ACT nasal spray Place 1 spray into both nostrils daily for 10 days. 04/19/17 04/29/17  Blue, Olivia C, PA-C  ibuprofen (ADVIL,MOTRIN) 200 MG tablet Take 400 mg by mouth every 6 (six) hours as needed for moderate pain.    [provider]  predniSONE (DELTASONE) 50 MG tablet Take 1 tablet (50 mg total) by mouth daily. 03/25/18   Ok Edwards, PA-C    Family History Family History  Problem Relation Age of Onset   Diabetes Sister    Diabetes Maternal Grandmother    Diabetes Other     Social History Social History   Tobacco Use   Smoking status: Never Smoker   Smokeless tobacco: Never Used  Substance Use Topics   Alcohol use: Yes    Comment: occ   Drug use: No     Allergies   Patient has no known allergies.   Review of Systems Review of Systems  Constitutional: Positive for fatigue. Negative for fever.  HENT: Negative for sore throat.   Eyes: Negative for visual disturbance.  Respiratory: Positive for shortness of breath. Negative for cough.   Cardiovascular: Positive for palpitations. Negative for chest pain.  Gastrointestinal: Negative for abdominal pain, nausea and vomiting.  Genitourinary: Negative for difficulty  urinating.  Musculoskeletal: Positive for myalgias. Negative for back pain and neck pain.  Skin: Negative for rash.  Neurological: Positive for light-headedness. Negative for syncope and headaches.     Physical Exam Updated Vital Signs BP 136/87 (BP Location: Right Arm)    Pulse 72    Temp 98.3 F (36.8 C) (Oral)    Resp 20    Ht 5\' 2"  (1.575 m)    Wt 127 kg    LMP 08/09/2014    SpO2 99%    BMI 51.21 kg/m   Physical Exam Vitals signs and nursing note reviewed.  Constitutional:      General: She is not in acute distress.    Appearance: She is well-developed. She is not diaphoretic.  HENT:     Head:  Normocephalic and atraumatic.  Eyes:     Conjunctiva/sclera: Conjunctivae normal.  Neck:     Musculoskeletal: Normal range of motion.  Cardiovascular:     Rate and Rhythm: Normal rate and regular rhythm.     Heart sounds: Normal heart sounds. No murmur. No friction rub. No gallop.   Pulmonary:     Effort: Pulmonary effort is normal. No respiratory distress.     Breath sounds: Normal breath sounds. No wheezing or rales.  Abdominal:     General: There is no distension.     Palpations: Abdomen is soft.     Tenderness: There is no abdominal tenderness. There is no guarding.  Musculoskeletal:        General: Tenderness (reports left calf tenderness) present.  Skin:    General: Skin is warm and dry.     Findings: No erythema or rash.  Neurological:     Mental Status: She is alert and oriented to person, place, and time.      ED Treatments / Results  Labs (all labs ordered are listed, but only abnormal results are displayed) Labs Reviewed  CBC WITH DIFFERENTIAL/PLATELET - Abnormal; Notable for the following components:      Result Value   WBC 3.8 (*)    All other components within normal limits  COMPREHENSIVE METABOLIC PANEL - Abnormal; Notable for the following components:   Glucose, Bld 104 (*)    Calcium 8.7 (*)    All other components within normal limits  D-DIMER, QUANTITATIVE (NOT AT [facility])  TROPONIN I    EKG EKG Interpretation  Date/Time:  Monday Jun 03 2018 20:02:46 EDT Ventricular Rate:  88 PR Interval:  162 QRS Duration: 78 QT Interval:  360 QTC Calculation: 435 R Axis:   25 Text Interpretation:  Normal sinus rhythm Normal ECG No significant change since last tracing Confirmed by Gareth Morgan [phone]) on 06/03/2018 11:08:46 PM   Radiology Dg Chest 2 View  Result Date: 06/03/2018 CLINICAL DATA:  Shortness of breath EXAM: CHEST - 2 VIEW COMPARISON:  11/09/2013 FINDINGS: Heart and mediastinal contours are within normal limits. No focal opacities or effusions.  No acute bony abnormality. IMPRESSION: No active cardiopulmonary disease. Electronically Signed   By: Rolm Baptise M.D.   On: 06/03/2018 22:35    Procedures Procedures (including critical care time)  Medications Ordered in ED Medications - No data to display   Initial Impression / Assessment and Plan / ED Course  I have reviewed the triage vital signs and the nursing notes.  Pertinent labs & imaging results that were available during my care of the patient were reviewed by me and considered in my medical decision making (see chart for details).  43yo female presents with concern for lightheadedness, dyspnea and palpitations. Also notes left calf and knee pain for 3 weeks.   Differential diagnosis for shortness of breath includes pulmonary embolus, pneumothorax, pneumonia, ACS, myocarditis, pericarditis.  EKG was done and evaluate by me and showed no acute ST changes. She has no signs of arrhythmias in the ED and reports she is continuing to have palpitations with normal sinus rhythm on monitor. Chest x-ray was done and evaluated by me and radiology and showed no sign of pneumonia, pulmonary edema or pneumothorax. Patient is PERC negative and low risk Wells with negative ddimer and have low suspicion for PE.  Patient is low risk HEART score, no chest pain, and troponin negative, doubt ACS or myocarditis.  Regarding leg pain, she is DVT Wells score 0 with negative ddimer and doubt DVT.  She has good pulses, no sign of arterial occlusion.   Recommend PCP follow up, discussed work up with pt. Patient discharged in stable condition with understanding of reasons to return.    Final Clinical Impressions(s) / ED Diagnoses   Final diagnoses:  Palpitations  Shortness of breath    ED Discharge Orders    None       Gareth Morgan, MD 06/04/18 1100

## 2018-06-03 NOTE — ED Triage Notes (Signed)
Pt reports ShOB, and "heart racing" x 2 days. Pt was sent home from work tonight due to high blood pressure. Pt also c/o L calf pain x 3 weeks.

## 2018-07-09 ENCOUNTER — Emergency Department (HOSPITAL_COMMUNITY): Payer: Managed Care, Other (non HMO)

## 2018-07-09 ENCOUNTER — Emergency Department (HOSPITAL_COMMUNITY)
Admission: EM | Admit: 2018-07-09 | Discharge: 2018-07-09 | Disposition: A | Discharge status: Home / Self Care | Payer: Managed Care, Other (non HMO) | Attending: Emergency Medicine | Admitting: Emergency Medicine

## 2018-07-09 ENCOUNTER — Other Ambulatory Visit: Payer: Self-pay

## 2018-07-09 DIAGNOSIS — R630 Anorexia: Secondary | ICD-10-CM | POA: Diagnosis not present

## 2018-07-09 DIAGNOSIS — R1033 Periumbilical pain: Secondary | ICD-10-CM | POA: Diagnosis not present

## 2018-07-09 DIAGNOSIS — R1032 Left lower quadrant pain: Secondary | ICD-10-CM | POA: Diagnosis present

## 2018-07-09 DIAGNOSIS — D3912 Neoplasm of uncertain behavior of left ovary: Secondary | ICD-10-CM | POA: Diagnosis not present

## 2018-07-09 DIAGNOSIS — N838 Other noninflammatory disorders of ovary, fallopian tube and broad ligament: Secondary | ICD-10-CM

## 2018-07-09 LAB — URINALYSIS, ROUTINE W REFLEX MICROSCOPIC
Bilirubin Urine: NEGATIVE
Glucose, UA: NEGATIVE mg/dL
Hgb urine dipstick: NEGATIVE
Ketones, ur: NEGATIVE mg/dL
Leukocytes,Ua: NEGATIVE
Nitrite: NEGATIVE
Protein, ur: NEGATIVE mg/dL
Specific Gravity, Urine: 1.015 (ref 1.005–1.030)
pH: 5 (ref 5.0–8.0)

## 2018-07-09 LAB — COMPREHENSIVE METABOLIC PANEL
ALT: 19 U/L (ref 0–44)
AST: 19 U/L (ref 15–41)
Albumin: 3.5 g/dL (ref 3.5–5.0)
Alkaline Phosphatase: 92 U/L (ref 38–126)
Anion gap: 10 (ref 5–15)
BUN: 11 mg/dL (ref 6–20)
CO2: 22 mmol/L (ref 22–32)
Calcium: 9 mg/dL (ref 8.9–10.3)
Chloride: 106 mmol/L (ref 98–111)
Creatinine, Ser: 0.7 mg/dL (ref 0.44–1.00)
GFR calc Af Amer: >60 mL/min (ref >60)
GFR calc non Af Amer: >60 mL/min (ref >60)
Glucose, Bld: 115 mg/dL — ABNORMAL HIGH (ref 70–99)
Potassium: 4.1 mmol/L (ref 3.5–5.1)
Sodium: 138 mmol/L (ref 135–145)
Total Bilirubin: 0.9 mg/dL (ref 0.3–1.2)
Total Protein: 6.8 g/dL (ref 6.5–8.1)

## 2018-07-09 LAB — CBC
HCT: 37.2 % (ref 36.0–46.0)
Hemoglobin: 12.2 g/dL (ref 12.0–15.0)
MCH: 28.4 pg (ref 26.0–34.0)
MCHC: 32.8 g/dL (ref 30.0–36.0)
MCV: 86.7 fL (ref 80.0–100.0)
Platelets: 287 10*3/uL (ref 150–400)
RBC: 4.29 MIL/uL (ref 3.87–5.11)
RDW: 13.7 % (ref 11.5–15.5)
WBC: 6.8 10*3/uL (ref 4.0–10.5)
nRBC: 0 % (ref 0.0–0.2)

## 2018-07-09 LAB — LIPASE, BLOOD: Lipase: 30 U/L (ref 11–51)

## 2018-07-09 MED ORDER — FENTANYL CITRATE (PF) 100 MCG/2ML IJ SOLN
50.0000 ug | Freq: Once | INTRAMUSCULAR | Status: AC
  Administered 2018-07-09: 50 ug via INTRAVENOUS
  Filled 2018-07-09: qty 2

## 2018-07-09 MED ORDER — TRAMADOL HCL 50 MG PO TABS: 50.0000 mg | ORAL_TABLET | Freq: Four times a day (QID) | ORAL | 0 refills | Status: DC | PRN

## 2018-07-09 MED ORDER — IOHEXOL 300 MG/ML  SOLN
100.0000 mL | Freq: Once | INTRAMUSCULAR | Status: AC | PRN
  Administered 2018-07-09: 100 mL via INTRAVENOUS

## 2018-07-09 MED ORDER — MELOXICAM 15 MG PO TABS: 15.0000 mg | ORAL_TABLET | Freq: Every day | ORAL | 0 refills | Status: DC

## 2018-07-09 MED ORDER — SODIUM CHLORIDE 0.9 % IV BOLUS
1000.0000 mL | Freq: Once | INTRAVENOUS | Status: AC
  Administered 2018-07-09: 1000 mL via INTRAVENOUS

## 2018-07-09 MED ORDER — ONDANSETRON HCL 4 MG/2ML IJ SOLN
4.0000 mg | Freq: Once | INTRAMUSCULAR | Status: AC
  Administered 2018-07-09: 4 mg via INTRAVENOUS
  Filled 2018-07-09: qty 2

## 2018-07-09 MED ORDER — FENTANYL CITRATE (PF) 100 MCG/2ML IJ SOLN
50.0000 ug | Freq: Once | INTRAMUSCULAR | Status: AC
  Administered 2018-07-09: 50 ug via INTRAVENOUS

## 2018-07-09 NOTE — ED Notes (Addendum)
Patient transported to us 

## 2018-07-09 NOTE — ED Notes (Signed)
Patient transported to CT 

## 2018-07-09 NOTE — ED Provider Notes (Signed)
[facility] EMERGENCY DEPARTMENT Provider Note   CSN: [id number] Arrival date & time: 07/09/18  7829    History   Chief Complaint Chief Complaint  Patient presents with   Abdominal Pain    HPI Janice Terrell is a 43 y.o. female.     The history is provided by the patient.  Abdominal Pain  Pain location:  LLQ and suprapubic Pain quality: aching, cramping, fullness and pressure   Pain radiates to:  Does not radiate Pain severity:  Severe Onset quality:  Gradual Duration:  2 days Timing:  Constant Progression:  Worsening Chronicity:  New Context: awakening from sleep and previous surgery (Partial hysterectomy)   Context: not alcohol use, not diet changes, not eating, not laxative use, not medication withdrawal, not recent illness, not recent sexual activity, not recent travel, not retching, not sick contacts, not suspicious food intake and not trauma   Relieved by:  Nothing Worsened by:  Movement, position changes and bowel movements (walking or jostling) Ineffective treatments: laxative produced BM without relief of pain. Associated symptoms: anorexia   Associated symptoms: no belching, no chest pain, no chills, no constipation, no cough, no diarrhea, no dysuria, no fatigue, no fever, no flatus, no hematemesis, no hematochezia, no hematuria, no melena, no nausea, no shortness of breath, no sore throat, no vaginal bleeding, no vaginal discharge and no vomiting   Risk factors: obesity   Risk factors: no alcohol abuse, no aspirin use, not elderly, has not had multiple surgeries, no NSAID use, not pregnant and no recent hospitalization     Past Medical History:  Diagnosis Date   Obesity, Class III, BMI 40-49.9 (morbid obesity) ([place]) 04/27/2011    Patient Active Problem List   Diagnosis Date Noted   Irregular bleeding 11/17/2014   Obesity, Class III, BMI 40-49.9 (morbid obesity) ([place]) 04/27/2011    Past Surgical History:  Procedure Laterality  Date   ABDOMINAL HYSTERECTOMY     BREATH TEK H PYLORI  05/19/2011   Procedure: BREATH TEK H PYLORI;  Surgeon: Pedro Earls, MD;  Location: [facility];  Service: General;  Laterality: N/A;   [place]  11/17/2014   Procedure: CYSTOSCOPY;  Surgeon: Crawford Givens, MD;  Location: [facility];  Service: Gynecology;;   TUBAL LIGATION       OB History   No obstetric history on file.      Home Medications    Prior to Admission medications   Medication Sig Start Date End Date Taking? Authorizing Provider  ibuprofen (ADVIL,MOTRIN) 200 MG tablet Take 800 mg by mouth every 6 (six) hours as needed for moderate pain.    Yes [provider]  fluticasone (FLONASE) 50 MCG/ACT nasal spray Place 1 spray into both nostrils daily for 10 days. Patient not taking: Reported on 07/09/2018 04/19/17 04/29/17  Blue, Belenda Cruise, PA-C  meloxicam (MOBIC) 15 MG tablet Take 1 tablet (15 mg total) by mouth daily. As needed for pain 07/09/18   Margarita Mail, PA-C  predniSONE (DELTASONE) 50 MG tablet Take 1 tablet (50 mg total) by mouth daily. Patient not taking: Reported on 07/09/2018 03/25/18   Ok Edwards, PA-C  traMADol (ULTRAM) 50 MG tablet Take 1 tablet (50 mg total) by mouth every 6 (six) hours as needed for severe pain. 07/09/18   Margarita Mail, PA-C    Family History Family History  Problem Relation Age of Onset   Diabetes Sister    Diabetes Maternal Grandmother  Diabetes Other     Social History Social History   Tobacco Use   Smoking status: Never Smoker   Smokeless tobacco: Never Used  Substance Use Topics   Alcohol use: Yes    Comment: occ   Drug use: No     Allergies   Patient has no known allergies.   Review of Systems Review of Systems  Constitutional: Negative for chills, fatigue and fever.  HENT: Negative.  Negative for sore throat.   Respiratory: Negative for cough and shortness of breath.   Cardiovascular: Negative for chest pain.    Gastrointestinal: Positive for abdominal pain and anorexia. Negative for constipation, diarrhea, flatus, hematemesis, hematochezia, melena, nausea and vomiting.  Endocrine: Negative.   Genitourinary: Negative for dysuria, hematuria, vaginal bleeding and vaginal discharge.  Musculoskeletal: Negative.   Neurological: Negative.   Psychiatric/Behavioral: Negative.   All other systems reviewed and are negative.    Physical Exam Updated Vital Signs BP 137/70 (BP Location: Right Arm)    Pulse 70    Temp 98 F (36.7 C) (Oral)    Resp 18    Ht 5\' 2"  (1.575 m)    Wt 127 kg    LMP 08/09/2014    SpO2 99%    BMI 51.21 kg/m   Physical Exam Vitals signs and nursing note reviewed.  Constitutional:      General: She is not in acute distress.    Appearance: She is well-developed. She is not diaphoretic.  HENT:     Head: Normocephalic and atraumatic.  Eyes:     General: No scleral icterus.    Conjunctiva/sclera: Conjunctivae normal.  Neck:     Musculoskeletal: Normal range of motion.  Cardiovascular:     Rate and Rhythm: Normal rate and regular rhythm.     Heart sounds: Normal heart sounds. No murmur. No friction rub. No gallop.   Pulmonary:     Effort: Pulmonary effort is normal. No respiratory distress.     Breath sounds: Normal breath sounds.  Abdominal:     General: Bowel sounds are normal. There is no distension.     Palpations: Abdomen is soft. There is no mass.     Tenderness: There is abdominal tenderness in the left lower quadrant. There is guarding and rebound.  Skin:    General: Skin is warm and dry.  Neurological:     Mental Status: She is alert and oriented to person, place, and time.  Psychiatric:        Behavior: Behavior normal.      ED Treatments / Results  Labs (all labs ordered are listed, but only abnormal results are displayed) Labs Reviewed  COMPREHENSIVE METABOLIC PANEL - Abnormal; Notable for the following components:      Result Value   Glucose, Bld 115  (*)    All other components within normal limits  URINALYSIS, ROUTINE W REFLEX MICROSCOPIC - Abnormal; Notable for the following components:   APPearance HAZY (*)    All other components within normal limits  LIPASE, BLOOD  CBC  CA 125    EKG None  Radiology US Transvaginal Non-ob  Result Date: 07/09/2018 CLINICAL DATA:  LEFT pelvic pain, cystic lesion in LEFT adnexa on CT EXAM: TRANSABDOMINAL AND TRANSVAGINAL ULTRASOUND OF PELVIS DOPPLER ULTRASOUND OF OVARIES TECHNIQUE: Both transabdominal and transvaginal ultrasound examinations of the pelvis were performed. Transabdominal technique was performed for global imaging of the pelvis including uterus, ovaries, adnexal regions, and pelvic cul-de-sac. It was necessary to proceed with endovaginal exam following  the transabdominal exam to visualize the ovaries. Color and duplex Doppler ultrasound was utilized to evaluate blood flow to the ovaries. COMPARISON:  CT abdomen and pelvis 07/09/2018, pelvic ultrasound 03/11/2017 FINDINGS: Uterus Surgically absent Endometrium N/A Right ovary Not visualized on either transabdominal or endovaginal imaging, likely obscured by bowel; RIGHT ovary appeared normal in size by prior CT Left ovary No normal appearing LEFT ovary visualized in the LEFT neck adnexa, a multiloculated cystic mass is identified which measures 12.4 x 5.7 x 9.6 cm in size, likely arising from and replacing the LEFT ovary. This has variable septations ranging from thin to thicker and slightly more irregular over 5 mm thick. No definite mural nodularity. Blood flow is detected within some of the septations on color Doppler imaging. Pulsed Doppler evaluation demonstrates normal low-resistance arterial and venous waveforms within the cystic LEFT adnexal presumed LEFT ovarian mass. RIGHT ovary not localized for Doppler assessment. Other findings No abnormal free fluid.  No additional pelvic masses. IMPRESSION: Post hysterectomy. Nonvisualization of RIGHT  ovary. Complicated multiloculated cystic mass of the LEFT adnexa, presumed LEFT ovarian origin, 12.4 x 5.7 x 9.6 cm in size containing septations of variable thickness, some thick and slightly more irregular, with internal blood flow seen within septations on color Doppler imaging. 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 Surgical evaluation recommended. No evidence of torsion of the presumed LEFT ovarian mass. Electronically Signed   By: Lavonia Dana M.D.   On: 07/09/2018 12:20   US Pelvis Complete  Result Date: 07/09/2018 CLINICAL DATA:  LEFT pelvic pain, cystic lesion in LEFT adnexa on CT EXAM: TRANSABDOMINAL AND TRANSVAGINAL ULTRASOUND OF PELVIS DOPPLER ULTRASOUND OF OVARIES TECHNIQUE: Both transabdominal and transvaginal ultrasound examinations of the pelvis were performed. Transabdominal technique was performed for global imaging of the pelvis including uterus, ovaries, adnexal regions, and pelvic cul-de-sac. It was necessary to proceed with endovaginal exam following the transabdominal exam to visualize the ovaries. Color and duplex Doppler ultrasound was utilized to evaluate blood flow to the ovaries. COMPARISON:  CT abdomen and pelvis 07/09/2018, pelvic ultrasound 03/11/2017 FINDINGS: Uterus Surgically absent Endometrium N/A Right ovary Not visualized on either transabdominal or endovaginal imaging, likely obscured by bowel; RIGHT ovary appeared normal in size by prior CT Left ovary No normal appearing LEFT ovary visualized in the LEFT neck adnexa, a multiloculated cystic mass is identified which measures 12.4 x 5.7 x 9.6 cm in size, likely arising from and replacing the LEFT ovary. This has variable septations ranging from thin to thicker and slightly more irregular over 5 mm thick. No definite mural nodularity. Blood flow is detected within some of the septations on color Doppler imaging. Pulsed Doppler evaluation demonstrates normal low-resistance arterial and  venous waveforms within the cystic LEFT adnexal presumed LEFT ovarian mass. RIGHT ovary not localized for Doppler assessment. Other findings No abnormal free fluid.  No additional pelvic masses. IMPRESSION: Post hysterectomy. Nonvisualization of RIGHT ovary. Complicated multiloculated cystic mass of the LEFT adnexa, presumed LEFT ovarian origin, 12.4 x 5.7 x 9.6 cm in size containing septations of variable thickness, some thick and slightly more irregular, with internal blood flow seen within septations on color Doppler imaging. 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 Surgical evaluation recommended. No evidence of torsion of the presumed LEFT ovarian mass. Electronically Signed   By: Lavonia Dana M.D.   On: 07/09/2018 12:20   Ct Abdomen Pelvis W Contrast  Result Date: 07/09/2018 CLINICAL  DATA:  Abdominal pain EXAM: CT ABDOMEN AND PELVIS WITH CONTRAST TECHNIQUE: Multidetector CT imaging of the abdomen and pelvis was performed using the standard protocol following bolus administration of intravenous contrast. CONTRAST:  160mL OMNIPAQUE 300 COMPARISON:  03/11/2017 FINDINGS: Lower chest: Lung bases are free of acute infiltrate or sizable effusion. Hepatobiliary: No focal liver abnormality is seen. No gallstones, gallbladder wall thickening, or biliary dilatation. Pancreas: Unremarkable. No pancreatic ductal dilatation or surrounding inflammatory changes. Spleen: Normal in size without focal abnormality. Adrenals/Urinary Tract: Adrenal glands are within normal limits. No renal calculi or obstructive changes seen. Renal cysts are noted bilaterally stable from the prior exam. Bladder is partially distended. Stomach/Bowel: Scattered diverticular change is noted without evidence of diverticulitis. No obstructive or inflammatory changes are seen. The appendix is within normal limits. Vascular/Lymphatic: No significant vascular findings are present. No enlarged abdominal or pelvic  lymph nodes. Reproductive: Uterus has been surgically removed. There is a bilobed cystic lesion arising from the left ovary which was not present on the prior exam measuring 8.1 x 4.9 cm. Other: No abdominal wall hernia or abnormality. No abdominopelvic ascites. Musculoskeletal: No acute or significant osseous findings. IMPRESSION: New left ovarian cystic lesion as described above which was not present on the prior exam. This could be the etiology of the patient's underlying discomfort. Pelvic ultrasound may be helpful for further clarification. Chronic changes as described above. Electronically Signed   By: Inez Catalina M.D.   On: 07/09/2018 10:34   [place] Art/ven Flow Abd Pelv Doppler  Result Date: 07/09/2018 CLINICAL DATA:  LEFT pelvic pain, cystic lesion in LEFT adnexa on CT EXAM: TRANSABDOMINAL AND TRANSVAGINAL ULTRASOUND OF PELVIS DOPPLER ULTRASOUND OF OVARIES TECHNIQUE: Both transabdominal and transvaginal ultrasound examinations of the pelvis were performed. Transabdominal technique was performed for global imaging of the pelvis including uterus, ovaries, adnexal regions, and pelvic cul-de-sac. It was necessary to proceed with endovaginal exam following the transabdominal exam to visualize the ovaries. Color and duplex Doppler ultrasound was utilized to evaluate blood flow to the ovaries. COMPARISON:  CT abdomen and pelvis 07/09/2018, pelvic ultrasound 03/11/2017 FINDINGS: Uterus Surgically absent Endometrium N/A Right ovary Not visualized on either transabdominal or endovaginal imaging, likely obscured by bowel; RIGHT ovary appeared normal in size by prior CT Left ovary No normal appearing LEFT ovary visualized in the LEFT neck adnexa, a multiloculated cystic mass is identified which measures 12.4 x 5.7 x 9.6 cm in size, likely arising from and replacing the LEFT ovary. This has variable septations ranging from thin to thicker and slightly more irregular over 5 mm thick. No definite mural nodularity. Blood  flow is detected within some of the septations on color Doppler imaging. Pulsed Doppler evaluation demonstrates normal low-resistance arterial and venous waveforms within the cystic LEFT adnexal presumed LEFT ovarian mass. RIGHT ovary not localized for Doppler assessment. Other findings No abnormal free fluid.  No additional pelvic masses. IMPRESSION: Post hysterectomy. Nonvisualization of RIGHT ovary. Complicated multiloculated cystic mass of the LEFT adnexa, presumed LEFT ovarian origin, 12.4 x 5.7 x 9.6 cm in size containing septations of variable thickness, some thick and slightly more irregular, with internal blood flow seen within septations on color Doppler imaging. 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 Surgical evaluation recommended. No evidence of torsion of the presumed LEFT ovarian mass. Electronically Signed   By: Lavonia Dana M.D.   On: 07/09/2018 12:20    Procedures Procedures (including critical care time)  Medications Ordered in ED  Medications  sodium chloride 0.9 % bolus 1,000 mL (0 mLs Intravenous Stopped 07/09/18 1034)  fentaNYL (SUBLIMAZE) injection 50 mcg (50 mcg Intravenous Given 07/09/18 0910)  ondansetron (ZOFRAN) injection 4 mg (4 mg Intravenous Given 07/09/18 0907)  iohexol (OMNIPAQUE) 300 MG/ML solution 100 mL (100 mLs Intravenous Contrast Given 07/09/18 1007)  fentaNYL (SUBLIMAZE) injection 50 mcg (50 mcg Intravenous Given 07/09/18 1055)     Initial Impression / Assessment and Plan / ED Course  I have reviewed the triage vital signs and the nursing notes.  Pertinent labs & imaging results that were available during my care of the patient were reviewed by me and considered in my medical decision making (see chart for details).  Clinical Course as of Jul 09 1606  Tue Jul 09, 2018  1116 I personally reviewed the CT abd and pelvis which shows a cystic lesion on the left ovary- will proceed with Pelvic US to to r/o torsion.  Patient  updated on plan. I have ordered more pain medication.   [AH]  1235 I personally reviewed the ultrasound images and have placed a consult GYN oncology regarding ovarian mass   [AH]  1315 US Pelvis Complete [AH]    Clinical Course User Index [AH] Letticia Bhattacharyya, PA-C      4:08 PM BP 137/70 (BP Location: Right Arm)    Pulse 70    Temp 98 F (36.7 C) (Oral)    Resp 18    Ht 5\' 2"  (1.575 m)    Wt 127 kg    LMP 08/09/2014    SpO2 99%    BMI 51.21 kg/m  Patient with point tenderness, guarding and mild rebound in the LLQ.  Differential diagnosis of her lower abdominal considerations include pelvic inflammatory disease, Diverticulitis, appendicitis, Renal calculi,  ovarian cyst or tumor, ovarian torsion, tubo-ovarian abscess,endometriosis, cystitis. Pain control provided, will proceed with CT. Suspect diverticulitis.   Patient with complaint of left lower quadrant abdominal pain.  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  I did review these images personally.  Patient's laboratory results are otherwise unremarkable.  She has a mildly elevated glucose, no leukocytosis or other significant abnormalities.  I spoke first with Dr. Delsa Sale of gynecology oncology who asked me to consult with her primary OB/GYN first.  I spoke with Dr. Cletis Media who is on-call for [facility] however the.  Patient has apparently been discharged from the practice.  Finally I spoke with Dr.Ugonna Anyanwu the faculty practice who is asked that I get a CA 125 level and the patient will be followed closely in their clinic for further evaluation.  I discussed all of the findings with the patient who understands.PDMP reviewed during this encounter. Patient will be discharged with Mobic and tramadol.  She appears appropriate for discharge.  I discussed return precautions.  Final Clinical Impressions(s) / ED Diagnoses   Final diagnoses:  Ovarian mass, left    ED  Discharge Orders         Ordered    traMADol (ULTRAM) 50 MG tablet  Every 6 hours PRN     07/09/18 1412    meloxicam (MOBIC) 15 MG tablet  Daily,   Status:  Discontinued     07/09/18 1412    meloxicam (MOBIC) 15 MG tablet  Daily     07/09/18 1419           Margarita Mail, PA-C 07/09/18 Walthall, Kevin, MD  07/12/18 1343 ° °

## 2018-07-09 NOTE — ED Notes (Signed)
Patient verbalizes understanding of discharge instructions. Opportunity for questioning and answering were provided.  patient discharged from ED.  

## 2018-07-09 NOTE — ED Notes (Signed)
Pt state her pain was better but returns after ct and movement. Will inform provider.

## 2018-07-09 NOTE — ED Triage Notes (Signed)
Pt endorses lower abd pain x 2 days. Denies urinary sx, n/v/d, chills or fever. VSS

## 2018-07-09 NOTE — Discharge Instructions (Signed)
Contact a health care provider if: You are losing weight for no reason. You feel generally ill. You have changes in your bowel or bladder function. Get help right away if: You have new or sudden symptoms that do not go away.

## 2018-07-09 NOTE — ED Notes (Signed)
Pt returns from US.

## 2018-07-24 ENCOUNTER — Encounter: Payer: Managed Care, Other (non HMO) | Admitting: Obstetrics & Gynecology

## 2018-07-24 NOTE — Progress Notes (Deleted)
   Patient did not show up today for her scheduled appointment.   UGONNA  ANYANWU, MD, FACOG Obstetrician & Gynecologist, [facility], [facility]  

## 2018-08-08 ENCOUNTER — Other Ambulatory Visit: Payer: Self-pay | Admitting: Obstetrics and Gynecology

## 2018-08-19 NOTE — Pre-Procedure Instructions (Signed)
Janice Terrell Bluford Kaufmann  08/19/2018     [facility] ([place]), [place] [address] [place] ([place]) [place] [address] Phone: [phone] Fax: [phone]   Your procedure is scheduled on Wednesday, July 29th  Report to [facility] Entrance A at 7:00 A.M.  Call this number if you have problems the morning of surgery:  [phone]   Remember:  Do not eat or drink after midnight.   Take these medicines the morning of surgery with A SIP OF WATER  NONE  7 days prior to surgery STOP taking any Aspirin (unless otherwise instructed by your surgeon), Aleve, Naproxen, Ibuprofen, Motrin, Advil, Goody's, BC's, all herbal medications, fish oil, and all vitamins.    Special instructions:   [facility]- Preparing For Surgery  Before surgery, you can play an important role. Because skin is not sterile, your skin needs to be as free of germs as possible. You can reduce the number of germs on your skin by washing with CHG (chlorahexidine gluconate) Soap before surgery.  CHG is an antiseptic cleaner which kills germs and bonds with the skin to continue killing germs even after washing.    Oral Hygiene is also important to reduce your risk of infection.  Remember - BRUSH YOUR TEETH THE MORNING OF SURGERY WITH YOUR REGULAR TOOTHPASTE  Please do not use if you have an allergy to CHG or antibacterial soaps. If your skin becomes reddened/irritated stop using the CHG.  Do not shave (including legs and underarms) for at least 48 hours prior to first CHG shower. It is OK to shave your face.  Please follow these instructions carefully.   1. Shower the NIGHT BEFORE SURGERY and the MORNING OF SURGERY with CHG.   2. If you chose to wash your hair, wash your hair first as usual with your normal shampoo.  3. After you shampoo, rinse your hair and body thoroughly to remove the shampoo.  4. Use CHG as you would any other liquid soap. You can apply CHG directly to the  skin and wash gently with a scrungie or a clean washcloth.   5. Apply the CHG Soap to your body ONLY FROM THE NECK DOWN.  Do not use on open wounds or open sores. Avoid contact with your eyes, ears, mouth and genitals (private parts). Wash Face and genitals (private parts)  with your normal soap.  6. Wash thoroughly, paying special attention to the area where your surgery will be performed.  7. Thoroughly rinse your body with warm water from the neck down.  8. DO NOT shower/wash with your normal soap after using and rinsing off the CHG Soap.  9. Pat yourself dry with a CLEAN TOWEL.  10. Wear CLEAN PAJAMAS to bed the night before surgery, wear comfortable clothes the morning of surgery  11. Place CLEAN SHEETS on your bed the night of your first shower and DO NOT SLEEP WITH PETS.  Day of Surgery: Do not wear jewelry, make-up or nail polish.  Do not wear lotions, powders, or perfumes, or deodorant.  Do not shave 48 hours prior to surgery.    Do not bring valuables to the hospital.  [facility] is not responsible for any belongings or valuables.  Please wear clean clothes to the hospital/surgery center.   Remember to brush your teeth WITH YOUR REGULAR TOOTHPASTE.  Contacts, dentures or bridgework may not be worn into surgery.  Leave your suitcase in the car.  After surgery it  may be brought to your room.  For patients admitted to the hospital, discharge time will be determined by your treatment team.  Patients discharged the day of surgery will not be allowed to drive home.   Please read over the following fact sheets that you were given. Pain Booklet, Coughing and Deep Breathing and Surgical Site Infection Prevention

## 2018-08-20 ENCOUNTER — Encounter (HOSPITAL_COMMUNITY)
Admission: RE | Admit: 2018-08-20 | Discharge: 2018-08-20 | Disposition: A | Discharge status: Home / Self Care | Payer: Managed Care, Other (non HMO) | Source: Ambulatory Visit | Attending: Obstetrics and Gynecology | Admitting: Obstetrics and Gynecology

## 2018-08-20 ENCOUNTER — Encounter (HOSPITAL_COMMUNITY): Payer: Self-pay

## 2018-08-20 ENCOUNTER — Other Ambulatory Visit: Payer: Self-pay

## 2018-08-20 DIAGNOSIS — Z01812 Encounter for preprocedural laboratory examination: Secondary | ICD-10-CM | POA: Diagnosis not present

## 2018-08-20 HISTORY — DX: Unspecified osteoarthritis, unspecified site: M19.90

## 2018-08-20 LAB — COMPREHENSIVE METABOLIC PANEL
ALT: 29 U/L (ref 0–44)
AST: 30 U/L (ref 15–41)
Albumin: 3.6 g/dL (ref 3.5–5.0)
Alkaline Phosphatase: 90 U/L (ref 38–126)
Anion gap: 8 (ref 5–15)
BUN: 11 mg/dL (ref 6–20)
CO2: 23 mmol/L (ref 22–32)
Calcium: 8.8 mg/dL — ABNORMAL LOW (ref 8.9–10.3)
Chloride: 107 mmol/L (ref 98–111)
Creatinine, Ser: 0.58 mg/dL (ref 0.44–1.00)
GFR calc Af Amer: >60 mL/min (ref >60)
GFR calc non Af Amer: >60 mL/min (ref >60)
Glucose, Bld: 101 mg/dL — ABNORMAL HIGH (ref 70–99)
Potassium: 3.9 mmol/L (ref 3.5–5.1)
Sodium: 138 mmol/L (ref 135–145)
Total Bilirubin: 0.3 mg/dL (ref 0.3–1.2)
Total Protein: 7 g/dL (ref 6.5–8.1)

## 2018-08-20 LAB — CBC
HCT: 39.2 % (ref 36.0–46.0)
Hemoglobin: 12.4 g/dL (ref 12.0–15.0)
MCH: 28.1 pg (ref 26.0–34.0)
MCHC: 31.6 g/dL (ref 30.0–36.0)
MCV: 88.7 fL (ref 80.0–100.0)
Platelets: 328 10*3/uL (ref 150–400)
RBC: 4.42 MIL/uL (ref 3.87–5.11)
RDW: 13.3 % (ref 11.5–15.5)
WBC: 4.8 10*3/uL (ref 4.0–10.5)
nRBC: 0 % (ref 0.0–0.2)

## 2018-08-20 LAB — TYPE AND SCREEN
ABO/RH(D): O NEG
Antibody Screen: NEGATIVE

## 2018-08-20 LAB — ABO/RH: ABO/RH(D): O NEG

## 2018-08-20 NOTE — Progress Notes (Signed)
PCP - Dr. Nolene Ebbs Cardiologist - denies  Chest x-ray - N/A EKG - 06/04/18 Stress Test - denies  ECHO - denies Cardiac Cath - denies  Sleep Study - denies  Aspirin Instructions: Patient instructed to hold all Aspirin, NSAID's, herbal medications, fish oil and vitamins 7 days prior to surgery.   Anesthesia review:   Patient denies shortness of breath, fever, cough and chest pain at PAT appointment   Patient verbalized understanding of instructions that were given to them at the PAT appointment. Patient was also instructed that they will need to review over the PAT instructions again at home before surgery.

## 2018-08-21 NOTE — H&P (Addendum)
Janice Terrell is a 43 y.o. female, S/P hysterectomy,  P: 2-0-0-2 for removal of her left ovary because of pelvic pain and a large complex left ovarian cystic mass.  Two months ago the patient began to experience daily, sharp, pelvic pain that caused her to go to the emergency department. Her pain was and is described as sharp however, it is no longer constant but now intermittent.  The sharpness will vary in intensity, made worse with intercourse, voiding, defecating and  certain movements.  June 2020 she was found,  by both CT scan of the pelvis and pelvic ultrasound to have a complex  multiloculated cystic mass on her left ovary measuring 12.4 cm. The ultrasound also showed some blood flow within some of the septations.  A CA-125 , CBC, CMET and lipase were all normal. The patient was placed on Tramadol with some relief and is therefore able to  function. She denies any vaginitis symptoms, changes in bowel or bladder function or fever.   Given the on-going pelvic pain, size and characteristics of her ovarian mass,  the patient has consented to proceed with removal of her  left ovary.   Past Medical History  OB History: G: 2;  P: 2-0-0-2;  1991 SVB and 1998 C-section  GYN History: menarche: 43 YO;   Contraception: Hysterectomy; Denies history of abnormal PAP smear.  Last PAP smear-2020- normal  Medical History: negative  Surgical History:1998 Tubal Sterilization;  2014 Left Bunionectomy and 2016 Laparoscopically Assisted Total Vaginal Hysterectomy Denies problems with anesthesia or history of blood transfusions  Family History: Diabetes Mellitus  Social History: Married and employed at [employer]; Denies tobacco use and occasionally uses alcohol  Medications: Multivitamins 1 po daily Vitamin C  1 po daily   No Known Allergies   Denies sensitivity to peanuts, shellfish, soy, latex or adhesives.   ROS: Admits left knee arthritis but denies corrective lenses,  headache, vision changes,  nasal congestion, dysphagia, tinnitus, dizziness, hoarseness, cough,  chest pain, shortness of breath, nausea, vomiting, diarrhea,constipation,  urinary frequency, urgency  dysuria, hematuria, vaginitis symptoms, pelvic pain, swelling of joints,easy bruising,  myalgias,  skin rashes, unexplained weight loss and except as is mentioned in the history of present illness, patient's review of systems is otherwise negative.   Physical Exam  Bp: 130/90   P: 85 bpm  Temperature: 97.3 degrees F orally;  Weight: 284 lbs. Height: 5\' 2"   BMI: 51.9   O2Sat. 98 % (room air)  Neck: supple without masses or thyromegaly Lungs: clear to auscultation Heart: regular rate and rhythm Abdomen: soft, diffuse lower tenderness and no organomegaly Pelvic:EGBUS- wnl; vagina-normal rugae; uterus and  cervix-surgically absent;  adnexae-left sided tenderness but no palpable separable  masses Extremities:  no clubbing, cyanosis or edema   Assesment: Pelvic Pain                      Large Complex Left Ovarian Cystic  Mass                      S/P Hysterectomy   Disposition:  A discussion was held with patient regarding the indication for her procedure(s) along with the risks, which include but are not limited to: reaction to anesthesia, damage to adjacent organs, infection,  excessive bleeding and possible need for an open abdominal incision.  The patient verbalized understanding of these risks and has consented to proceed with a Laparoscopic Left Oophorectomy with Possible Laparotomy at [facility]  OR on August 28, 2018.   CSN# [id number]   [place] J. Florene Glen, PA-C  for Dr. Franklyn Lor. Jye Fariss   Date of Initial H&P: 7/22  History reviewed, patient examined, no change in status, stable for surgery.  Pt declined a GYN ONC consult.  We will still do pelvic washings.  She also understands she may need a laparotomy bc the mass is so large.

## 2018-08-24 ENCOUNTER — Other Ambulatory Visit (HOSPITAL_COMMUNITY)
Admission: RE | Admit: 2018-08-24 | Discharge: 2018-08-24 | Disposition: A | Discharge status: Home / Self Care | Payer: Managed Care, Other (non HMO) | Source: Ambulatory Visit | Attending: Obstetrics and Gynecology | Admitting: Obstetrics and Gynecology

## 2018-08-24 DIAGNOSIS — Z1159 Encounter for screening for other viral diseases: Secondary | ICD-10-CM | POA: Insufficient documentation

## 2018-08-24 LAB — SARS CORONAVIRUS 2 (TAT 6-24 HRS): SARS Coronavirus 2: NEGATIVE

## 2018-08-27 MED ORDER — DEXTROSE 5 % IV SOLN
3.0000 g | INTRAVENOUS | Status: AC
  Administered 2018-08-28: 10:00:00 3 g via INTRAVENOUS
  Filled 2018-08-27: qty 3

## 2018-08-28 ENCOUNTER — Encounter (HOSPITAL_COMMUNITY)
Admission: RE | Disposition: A | Discharge status: Home / Self Care | Payer: Self-pay | Source: Home / Self Care | Attending: Obstetrics and Gynecology

## 2018-08-28 ENCOUNTER — Observation Stay (HOSPITAL_COMMUNITY): Payer: Managed Care, Other (non HMO) | Admitting: Certified Registered Nurse Anesthetist

## 2018-08-28 ENCOUNTER — Encounter (HOSPITAL_COMMUNITY): Payer: Self-pay | Admitting: General Practice

## 2018-08-28 ENCOUNTER — Observation Stay (HOSPITAL_COMMUNITY)
Admission: RE | Admit: 2018-08-28 | Discharge: 2018-08-28 | Disposition: A | Discharge status: Home / Self Care | Payer: Managed Care, Other (non HMO) | Attending: Obstetrics and Gynecology | Admitting: Obstetrics and Gynecology

## 2018-08-28 ENCOUNTER — Other Ambulatory Visit: Payer: Self-pay

## 2018-08-28 DIAGNOSIS — Y92234 Operating room of hospital as the place of occurrence of the external cause: Secondary | ICD-10-CM | POA: Diagnosis not present

## 2018-08-28 DIAGNOSIS — Z20828 Contact with and (suspected) exposure to other viral communicable diseases: Secondary | ICD-10-CM | POA: Insufficient documentation

## 2018-08-28 DIAGNOSIS — S36533A Laceration of sigmoid colon, initial encounter: Secondary | ICD-10-CM | POA: Diagnosis not present

## 2018-08-28 DIAGNOSIS — N8312 Corpus luteum cyst of left ovary: Principal | ICD-10-CM | POA: Insufficient documentation

## 2018-08-28 DIAGNOSIS — N736 Female pelvic peritoneal adhesions (postinfective): Secondary | ICD-10-CM | POA: Insufficient documentation

## 2018-08-28 DIAGNOSIS — Y838 Other surgical procedures as the cause of abnormal reaction of the patient, or of later complication, without mention of misadventure at the time of the procedure: Secondary | ICD-10-CM | POA: Insufficient documentation

## 2018-08-28 DIAGNOSIS — N83209 Unspecified ovarian cyst, unspecified side: Secondary | ICD-10-CM | POA: Diagnosis present

## 2018-08-28 DIAGNOSIS — Y763 Surgical instruments, materials and obstetric and gynecological devices (including sutures) associated with adverse incidents: Secondary | ICD-10-CM | POA: Diagnosis not present

## 2018-08-28 DIAGNOSIS — R109 Unspecified abdominal pain: Secondary | ICD-10-CM | POA: Insufficient documentation

## 2018-08-28 DIAGNOSIS — N83202 Unspecified ovarian cyst, left side: Secondary | ICD-10-CM | POA: Diagnosis present

## 2018-08-28 HISTORY — PX: LAPAROSCOPIC LYSIS OF ADHESIONS: SHX5905

## 2018-08-28 SURGERY — OOPHORECTOMY, LAPAROSCOPIC
Anesthesia: General | Site: Abdomen

## 2018-08-28 MED ORDER — MIDAZOLAM HCL 2 MG/2ML IJ SOLN
INTRAMUSCULAR | Status: AC
  Filled 2018-08-28: qty 2

## 2018-08-28 MED ORDER — MEPERIDINE HCL 25 MG/ML IJ SOLN: 6.2500 mg | INTRAMUSCULAR | Status: DC | PRN

## 2018-08-28 MED ORDER — PROPOFOL 10 MG/ML IV BOLUS
INTRAVENOUS | Status: AC
  Filled 2018-08-28: qty 20

## 2018-08-28 MED ORDER — ONDANSETRON HCL 4 MG/2ML IJ SOLN
INTRAMUSCULAR | Status: AC
  Filled 2018-08-28: qty 2

## 2018-08-28 MED ORDER — BUPIVACAINE-EPINEPHRINE 0.25% -1:200000 IJ SOLN
INTRAMUSCULAR | Status: DC | PRN
  Administered 2018-08-28: 11 mL

## 2018-08-28 MED ORDER — ONDANSETRON HCL 4 MG/2ML IJ SOLN
INTRAMUSCULAR | Status: DC | PRN
  Administered 2018-08-28: 4 mg via INTRAVENOUS

## 2018-08-28 MED ORDER — ROCURONIUM BROMIDE 10 MG/ML (PF) SYRINGE
PREFILLED_SYRINGE | INTRAVENOUS | Status: DC | PRN
  Administered 2018-08-28 (×4): 20 mg via INTRAVENOUS
  Administered 2018-08-28: 120 mg via INTRAVENOUS
  Administered 2018-08-28: 10 mg via INTRAVENOUS

## 2018-08-28 MED ORDER — DEXAMETHASONE SODIUM PHOSPHATE 10 MG/ML IJ SOLN
INTRAMUSCULAR | Status: DC | PRN
  Administered 2018-08-28: 5 mg via INTRAVENOUS

## 2018-08-28 MED ORDER — SODIUM CHLORIDE 0.9 % IR SOLN
Status: DC | PRN
  Administered 2018-08-28: 3000 mL

## 2018-08-28 MED ORDER — PROPOFOL 10 MG/ML IV BOLUS
INTRAVENOUS | Status: DC | PRN
  Administered 2018-08-28: 140 mg via INTRAVENOUS

## 2018-08-28 MED ORDER — ROCURONIUM BROMIDE 10 MG/ML (PF) SYRINGE
PREFILLED_SYRINGE | INTRAVENOUS | Status: AC
  Filled 2018-08-28: qty 10

## 2018-08-28 MED ORDER — ROCURONIUM BROMIDE 10 MG/ML (PF) SYRINGE
PREFILLED_SYRINGE | INTRAVENOUS | Status: AC
  Filled 2018-08-28: qty 20

## 2018-08-28 MED ORDER — HYDROMORPHONE HCL 1 MG/ML IJ SOLN
0.2500 mg | INTRAMUSCULAR | Status: DC | PRN
  Administered 2018-08-28 (×4): 0.5 mg via INTRAVENOUS

## 2018-08-28 MED ORDER — MIDAZOLAM HCL 5 MG/5ML IJ SOLN
INTRAMUSCULAR | Status: DC | PRN
  Administered 2018-08-28: 2 mg via INTRAVENOUS

## 2018-08-28 MED ORDER — HYDROMORPHONE HCL 1 MG/ML IJ SOLN
INTRAMUSCULAR | Status: AC
  Filled 2018-08-28: qty 1

## 2018-08-28 MED ORDER — DEXAMETHASONE SODIUM PHOSPHATE 10 MG/ML IJ SOLN
INTRAMUSCULAR | Status: AC
  Filled 2018-08-28: qty 2

## 2018-08-28 MED ORDER — OXYCODONE-ACETAMINOPHEN 5-325 MG PO TABS: ORAL_TABLET | ORAL | 0 refills | Status: DC

## 2018-08-28 MED ORDER — LIDOCAINE 2% (20 MG/ML) 5 ML SYRINGE
INTRAMUSCULAR | Status: AC
  Filled 2018-08-28: qty 10

## 2018-08-28 MED ORDER — FENTANYL CITRATE (PF) 250 MCG/5ML IJ SOLN
INTRAMUSCULAR | Status: AC
  Filled 2018-08-28: qty 5

## 2018-08-28 MED ORDER — SUGAMMADEX SODIUM 200 MG/2ML IV SOLN
INTRAVENOUS | Status: DC | PRN
  Administered 2018-08-28: 257 mg via INTRAVENOUS

## 2018-08-28 MED ORDER — LACTATED RINGERS IV SOLN
INTRAVENOUS | Status: DC
  Administered 2018-08-28 (×2): via INTRAVENOUS

## 2018-08-28 MED ORDER — LIDOCAINE 2% (20 MG/ML) 5 ML SYRINGE
INTRAMUSCULAR | Status: DC | PRN
  Administered 2018-08-28: 100 mg via INTRAVENOUS

## 2018-08-28 MED ORDER — ONDANSETRON HCL 4 MG/2ML IJ SOLN
4.0000 mg | Freq: Once | INTRAMUSCULAR | Status: AC | PRN
  Administered 2018-08-28: 4 mg via INTRAVENOUS

## 2018-08-28 MED ORDER — FENTANYL CITRATE (PF) 250 MCG/5ML IJ SOLN
INTRAMUSCULAR | Status: DC | PRN
  Administered 2018-08-28 (×3): 50 ug via INTRAVENOUS
  Administered 2018-08-28: 100 ug via INTRAVENOUS

## 2018-08-28 SURGICAL SUPPLY — 66 items
ADH SKN CLS APL DERMABOND .7 (GAUZE/BANDAGES/DRESSINGS) ×3
APL SRG 38 LTWT LNG FL B (MISCELLANEOUS) ×3
APL SWBSTK 6 STRL LF DISP (MISCELLANEOUS) ×6
APPLICATOR ARISTA FLEXITIP XL (MISCELLANEOUS) ×3 IMPLANT
APPLICATOR COTTON TIP 6 STRL (MISCELLANEOUS) ×2 IMPLANT
APPLICATOR COTTON TIP 6IN STRL (MISCELLANEOUS) ×10
BAG SPEC RTRVL LRG 6X4 10 (ENDOMECHANICALS)
BARRIER ADHS 3X4 INTERCEED (GAUZE/BANDAGES/DRESSINGS) IMPLANT
BLADE SURG 10 STRL SS (BLADE) ×4 IMPLANT
BRR ADH 4X3 ABS CNTRL BYND (GAUZE/BANDAGES/DRESSINGS)
CABLE HIGH FREQUENCY MONO STRZ (ELECTRODE) IMPLANT
CANISTER SUCT 3000ML PPV (MISCELLANEOUS) ×5 IMPLANT
DECANTER SPIKE VIAL GLASS SM (MISCELLANEOUS) ×3 IMPLANT
DERMABOND ADVANCED (GAUZE/BANDAGES/DRESSINGS) ×2
DERMABOND ADVANCED .7 DNX12 (GAUZE/BANDAGES/DRESSINGS) ×3 IMPLANT
DRAPE WARM FLUID 44X44 (DRAPES) IMPLANT
DRSG OPSITE POSTOP 3X4 (GAUZE/BANDAGES/DRESSINGS) ×3 IMPLANT
DURAPREP 26ML APPLICATOR (WOUND CARE) ×5 IMPLANT
ELECT CAUTERY BLADE 6.4 (BLADE) IMPLANT
FORCEPS CUTTING 33CM 5MM (CUTTING FORCEPS) IMPLANT
FORCEPS CUTTING 45CM 5MM (CUTTING FORCEPS) IMPLANT
GAUZE 4X4 16PLY RFD (DISPOSABLE) IMPLANT
GAUZE VASELINE 3X9 (GAUZE/BANDAGES/DRESSINGS) IMPLANT
GLOVE BIO SURGEON STRL SZ 6.5 (GLOVE) ×4 IMPLANT
GLOVE BIO SURGEONS STRL SZ 6.5 (GLOVE) ×1
GLOVE BIOGEL PI IND STRL 7.0 (GLOVE) ×9 IMPLANT
GLOVE BIOGEL PI INDICATOR 7.0 (GLOVE) ×6
GOWN STRL REUS W/ TWL LRG LVL3 (GOWN DISPOSABLE) ×5 IMPLANT
GOWN STRL REUS W/TWL LRG LVL3 (GOWN DISPOSABLE) ×5
HEMOSTAT ARISTA ABSORB 3G PWDR (HEMOSTASIS) ×3 IMPLANT
MANIPULATOR UTERINE 4.5 ZUMI (MISCELLANEOUS) IMPLANT
NEEDLE HYPO 22GX1.5 SAFETY (NEEDLE) IMPLANT
NS IRRIG 1000ML POUR BTL (IV SOLUTION) ×5 IMPLANT
PACK ABDOMINAL GYN (CUSTOM PROCEDURE TRAY) ×2 IMPLANT
PACK LAPAROSCOPY BASIN (CUSTOM PROCEDURE TRAY) ×5 IMPLANT
PACK TRENDGUARD 450 HYBRID PRO (MISCELLANEOUS) ×1 IMPLANT
PAD OB MATERNITY 4.3X12.25 (PERSONAL CARE ITEMS) ×5 IMPLANT
POUCH LAPAROSCOPIC INSTRUMENT (MISCELLANEOUS) ×3 IMPLANT
POUCH SPECIMEN RETRIEVAL 10MM (ENDOMECHANICALS) IMPLANT
PROTECTOR NERVE ULNAR (MISCELLANEOUS) ×10 IMPLANT
SCISSORS LAP 5X35 DISP (ENDOMECHANICALS) ×3 IMPLANT
SET IRRIG TUBING LAPAROSCOPIC (IRRIGATION / IRRIGATOR) ×3 IMPLANT
SHEARS HARMONIC ACE PLUS 36CM (ENDOMECHANICALS) IMPLANT
SLEEVE XCEL OPT CAN 5 100 (ENDOMECHANICALS) ×5 IMPLANT
SOLUTION ELECTROLUBE (MISCELLANEOUS) IMPLANT
SPONGE LAP 18X18 RF (DISPOSABLE) ×4 IMPLANT
SUT CHROMIC 2 0 CT 1 (SUTURE) ×2 IMPLANT
SUT MNCRL AB 3-0 PS2 27 (SUTURE) ×5 IMPLANT
SUT PDS AB 0 CT 36 (SUTURE) IMPLANT
SUT PLAIN 2 0 XLH (SUTURE) IMPLANT
SUT SILK 3 0 SH 30 (SUTURE) ×15 IMPLANT
SUT VIC AB 0 CT1 18XCR BRD8 (SUTURE) IMPLANT
SUT VIC AB 0 CT1 27 (SUTURE)
SUT VIC AB 0 CT1 27XBRD ANBCTR (SUTURE) ×4 IMPLANT
SUT VIC AB 0 CT1 8-18 (SUTURE)
SUT VICRYL 0 ENDOLOOP (SUTURE) ×9 IMPLANT
SUT VICRYL 0 TIES 12 18 (SUTURE) ×2 IMPLANT
SUT VICRYL 0 UR6 27IN ABS (SUTURE) ×10 IMPLANT
SYR 50ML LL SCALE MARK (SYRINGE) IMPLANT
SYR CONTROL 10ML LL (SYRINGE) IMPLANT
TOWEL GREEN STERILE FF (TOWEL DISPOSABLE) ×7 IMPLANT
TRAY FOLEY W/BAG SLVR 14FR (SET/KITS/TRAYS/PACK) ×5 IMPLANT
TRENDGUARD 450 HYBRID PRO PACK (MISCELLANEOUS) ×5
TROCAR BALLN 12MMX100 BLUNT (TROCAR) ×5 IMPLANT
TUBING EVAC SMOKE HEATED PNEUM (TUBING) ×2 IMPLANT
WARMER LAPAROSCOPE (MISCELLANEOUS) ×5 IMPLANT

## 2018-08-28 NOTE — Anesthesia Postprocedure Evaluation (Signed)
Anesthesia Post Note  Patient: Janice Terrell  Procedure(s) Performed: LAPAROSCOPIC OOPHORECTOMY (Left Abdomen) Laparoscopic Lysis Of Adhesions with repair of serosal tear of colon (N/A Abdomen)     Patient location during evaluation: PACU Anesthesia Type: General Level of consciousness: awake and alert Pain management: pain level controlled Vital Signs Assessment: post-procedure vital signs reviewed and stable Respiratory status: spontaneous breathing, nonlabored ventilation, respiratory function stable and patient connected to nasal cannula oxygen Cardiovascular status: blood pressure returned to baseline and stable Postop Assessment: no apparent nausea or vomiting Anesthetic complications: no    Last Vitals:  Vitals:   08/28/18 1325 08/28/18 1340  BP: (!) 172/91 (!) 163/94  Pulse: 78 78  Resp: (!) 25 (!) 21  Temp:    SpO2: 93% 93%    Last Pain:  Vitals:   08/28/18 1340  PainSc: 0-No pain                 Jeanita Carneiro DAVID

## 2018-08-28 NOTE — Anesthesia Preprocedure Evaluation (Signed)
Anesthesia Evaluation  Patient identified by MRN, date of birth, ID band Patient awake    Reviewed: Allergy & Precautions, NPO status , Patient's Chart, lab work & pertinent test results  Airway Mallampati: I  TM Distance: >3 FB Neck ROM: Full    Dental   Pulmonary    Pulmonary exam normal        Cardiovascular Normal cardiovascular exam     Neuro/Psych    GI/Hepatic   Endo/Other    Renal/GU      Musculoskeletal   Abdominal   Peds  Hematology   Anesthesia Other Findings   Reproductive/Obstetrics                             Anesthesia Physical Anesthesia Plan  ASA: II  Anesthesia Plan: General   Post-op Pain Management:    Induction: Intravenous  PONV Risk Score and Plan: 3 and Ondansetron, Dexamethasone and Midazolam  Airway Management Planned: Oral ETT  Additional Equipment:   Intra-op Plan:   Post-operative Plan: Extubation in OR  Informed Consent: I have reviewed the patients History and Physical, chart, labs and discussed the procedure including the risks, benefits and alternatives for the proposed anesthesia with the patient or authorized representative who has indicated his/her understanding and acceptance.       Plan Discussed with: CRNA and Surgeon  Anesthesia Plan Comments:         Anesthesia Quick Evaluation

## 2018-08-28 NOTE — Op Note (Signed)
08/28/2018  11:55 AM  PATIENT:  Janice Terrell  43 y.o. female  PRE-OPERATIVE DIAGNOSIS:  Ovarian Cyst and Abdominal Pain;   POST-OPERATIVE DIAGNOSIS:  ovarian cyst and abdominal pain; sigmoid colon serosal tear  PROCEDURE:  Procedure(s): LAPAROSCOPIC OOPHORECTOMY - Dr Charlesetta Garibaldi LAPAROSCOPIC REPAIR OF SIGMOID COLON SEROSAL TEAR - Dr Redmond Pulling  SURGEON:  Surgeon(s): Gayland Curry MD FACS  ASSISTANTS:Dillard, Gwynneth Munson, MD  ANESTHESIA:   general  DRAINS: none   LOCAL MEDICATIONS USED:  MARCAINE     SPECIMEN:  No Specimen  DISPOSITION OF SPECIMEN:  N/A  COUNTS:  YES  INDICATION FOR PROCEDURE: Patient is a 43 year old female who was undergoing laparoscopic ovarian cystectomy by Dr. Charlesetta Garibaldi.  The patient had adhesions to the ovarian cyst.  I was called in to the operating room to assist with evaluation of the sigmoid colon.  It appeared that the sigmoid colon had a small serosal tear.  The length of the serosal tear was approximately 1-1/2 cm.  It was not full-thickness.  There is no evidence of contamination.    PROCEDURE: Please see Dr. Berneta Sages operative note for additional details.  Upon reviewing the laparoscopic image it appeared that the patient had a serosal tear to her sigmoid colon at the pelvic inlet of about 1-1/2 cm.  The edges of the serosal tear were viable.  There is no evidence of ischemia.  There is no evidence of enteric drainage.  It was not full-thickness.  I felt that it could be repaired with several 3-0 silk sutures laparoscopically.  I repaired the serosal tear with 3 interrupted 3-0 silk sutures in a Lembert fashion.  At this point the serosal tear appear well reapproximated.  At this point I scrubbed out and Dr. Charlesetta Garibaldi continued with her procedure.  Please see her operative note for additional details.  PLAN OF CARE: per dr Charlesetta Garibaldi  PATIENT DISPOSITION:  still in OR with GYN   Delay start of Pharmacological VTE agent (>24hrs) due to surgical blood loss or  risk of bleeding:  no  Leighton Ruff. Redmond Pulling, MD, FACS General, Bariatric, & Minimally Invasive Surgery [facility], [place]

## 2018-08-28 NOTE — Discharge Instructions (Signed)
Call [facility] @ [phone] if:  You have a temperature greater than or equal to 100.4 degrees Farenheit orally You have pain that is not made better by the pain medication given and taken as directed You have excessive bleeding or problems urinating  Take Colace (Docusate Sodium/Stool Softener) 100 mg 2-3 times daily while taking narcotic pain medicine to avoid constipation or until bowel movements are regular. Take Ibuprofen 800 mg with food every 6 hours for 5 days then as needed for pain.  You may drive after 36 hours You may walk up steps  You may shower tomorrow You may resume a regular diet  Keep incisions clean and dry Do not lift over 15 pounds for 6 weeks Avoid anything in vagina  until after your post-operative visit

## 2018-08-28 NOTE — Anesthesia Procedure Notes (Signed)
Procedure Name: Intubation Date/Time: 08/28/2018 9:34 AM Performed by: Glynda Jaeger, CRNA Pre-anesthesia Checklist: Patient identified, Patient being monitored, Timeout performed, Emergency Drugs available and Suction available Patient Re-evaluated:Patient Re-evaluated prior to induction Oxygen Delivery Method: Circle System Utilized Preoxygenation: Pre-oxygenation with 100% oxygen Induction Type: IV induction Ventilation: Mask ventilation without difficulty Laryngoscope Size: Mac and 4 Grade View: Grade I Tube type: Oral Tube size: 7.5 mm Number of attempts: 1 Airway Equipment and Method: Stylet Placement Confirmation: ETT inserted through vocal cords under direct vision,  positive ETCO2 and breath sounds checked- equal and bilateral Secured at: 23 cm Tube secured with: Tape Dental Injury: Teeth and Oropharynx as per pre-operative assessment

## 2018-08-28 NOTE — Progress Notes (Signed)
Called Dr. Charlesetta Garibaldi for clarification on patients order. Patient is to be discharged from [place], Marsh Dolly, RN

## 2018-08-28 NOTE — Transfer of Care (Signed)
Immediate Anesthesia Transfer of Care Note  Patient: Janice Terrell  Procedure(s) Performed: LAPAROSCOPIC OOPHORECTOMY (Left Abdomen) Laparoscopic Lysis Of Adhesions with repair of serosal tear of colon (N/A Abdomen)  Patient Location: PACU  Anesthesia Type:General  Level of Consciousness: awake, alert , patient cooperative and responds to stimulation  Airway & Oxygen Therapy: Patient Spontanous Breathing and Patient connected to face mask oxygen  Post-op Assessment: Report given to RN and Post -op Vital signs reviewed and stable  Post vital signs: Reviewed and stable  Last Vitals:  Vitals Value Taken Time  BP 156/86 08/28/18 1255  Temp    Pulse 86 08/28/18 1258  Resp 22 08/28/18 1258  SpO2 100 % 08/28/18 1258  Vitals shown include unvalidated device data.  Last Pain:  Vitals:   08/28/18 0742  PainSc: 0-No pain      Patients Stated Pain Goal: 3 (48/47/20 7218)  Complications: No apparent anesthesia complications

## 2018-08-28 NOTE — Op Note (Signed)
Operative  Laparoscopy Procedure Note  Indications: The patient is a 43 y.o. female with 12-14 cm ovarian cyst and pelvic pain.  Pre-operative Diagnosis: 14 cm ovarian cyst and pelvic pain  Post-operative Diagnosis: same  Surgeon: Redford   Assistants: Earnstine Regal PA  Anesthesia: General endotracheal anesthesia  ASA Class: per anesthesia  Procedure Details  The patient was seen in the Holding Room. The risks, benefits, complications, treatment options, and expected outcomes were discussed with the patient. The possibilities of reaction to medication, pulmonary aspiration, perforation of viscus, bleeding, recurrent infection, the need for additional procedures, failure to diagnose a condition, and creating a complication requiring transfusion or operation were discussed with the patient. The patient concurred with the proposed plan, giving informed consent. The patient was taken to the Operating Room, identified as Janice Terrell and the procedure verified as Diagnostic Laparoscopy. A Time Out was held and the above information confirmed.  After induction of general anesthesia, the patient was placed in modified dorsal lithotomy position where she was prepped, draped, and catheterized in the normal, sterile fashion.  The cervix was absent and a sponge on a stick was place din the vagina. . A 2 cm umbilical incision was then performed. The incision was carrried down to the fascia.  The fascia was then incised and extended with Mayo scissors.  Peritoneum was then entered bluntly.  circumfirential suture was placed with 0 Vicryl.  Was in place and anchored to the suture.  Insufflation  of CO2 gas was then done. Janice Terrell was placed in the right and left lower quadrants.  Two 5 mm trochars were placed in the right and left quadrants under direct visualization... The above findings were noted.  Dense adhesions going from the left ovary to the anterior abdominal wall posterior cul-de-sac  and sidewall.  Dense adhesions from the colon to the ovary.  Ovary looked benign and was very large at 12 to 14 cm. Pelvic washings were obtained and sent.   her right ovary appeared normal.  Remaining of her abdominal anatomy appeared normal.  Lysis of adhesions took about 2 hours to release the ovary from the anterior abdominal wall and sidewall.  During lysis of adhesions the there was a small serosal tear noted on the colon.  Dr. Redmond Pulling came in and repaired that laparoscopically.  Once all of the adhesions were lysed after said about 2 hours.  Both sharply and bluntly.  2 Endoloops were placed around the left infundibulopelvic ligament.  Ureter was seen to be far away from the ligation.  Tibial pelvic ligament was then cauterized and cut using the gyrus.  The ovary had been entered and drained.  Placed in the cul-de-sac.  A 5 mm scope was then placed in the right lower quadrant and using the Endo Catch bag the ovary was removed going through the 10 mm port at the umbilicus.  Irrigation was done of the abdomen hemostasis was noted.  Oozing along the left pelvic sidewall where the dissection had been done.  Still was placed in this area.  Following the procedure the umbilical sheath was removed after intra-abdominal carbon dioxide was expressed. The fascia was reapproximated by tying the o vicrryl suture.   The incision was closed with subcutaneous and subcuticular sutures of 4-0 monocryl. The intrauterine manipulator was then removed.  Instrument, sponge, and needle counts were correct prior to abdominal closure and at the conclusion of the case.   Findings: As above  Estimated Blood Loss:  200 mL         Drains: Not applicable         Total IV Fluids: 1800 mL         Specimens:  left ovarian cyst and left ovary              Complications:  Colon serosal tear repaired by Dr Redmond Pulling         Disposition: PACU - hemodynamically stable.         Condition: stable

## 2018-08-28 NOTE — OR Nursing (Signed)
Dr. Ok Anis General surgeon called to room at 1030 to look at serosal tear of colon. Repaired tear and left room at 1140

## 2018-08-29 ENCOUNTER — Encounter (HOSPITAL_COMMUNITY): Payer: Self-pay | Admitting: Obstetrics and Gynecology

## 2018-11-23 ENCOUNTER — Encounter (HOSPITAL_COMMUNITY): Payer: Self-pay | Admitting: *Deleted

## 2018-11-23 ENCOUNTER — Other Ambulatory Visit: Payer: Self-pay

## 2018-11-23 ENCOUNTER — Ambulatory Visit (HOSPITAL_COMMUNITY)
Admission: EM | Admit: 2018-11-23 | Discharge: 2018-11-23 | Disposition: A | Discharge status: Home / Self Care | Payer: Managed Care, Other (non HMO) | Attending: Family Medicine | Admitting: Family Medicine

## 2018-11-23 DIAGNOSIS — M7711 Lateral epicondylitis, right elbow: Secondary | ICD-10-CM

## 2018-11-23 MED ORDER — MELOXICAM 7.5 MG PO TABS: 7.5000 mg | ORAL_TABLET | Freq: Every day | ORAL | 1 refills | Status: DC

## 2018-11-23 NOTE — ED Provider Notes (Signed)
[facility]    CSN: [id number] Arrival date & time: 11/23/18  1022      History   Chief Complaint Chief Complaint  Patient presents with  . Arm Pain    HPI Janice Terrell is a 43 y.o. female.   Patient complains of pain in right arm.  Seems to be maximum around elbow, specifically the lateral epicondyle.  Pain increases with rotation of the arm as well as resistance of dorsiflexion of the hand at the wrist.  She does not do any repetitive activity or motion which would be likely causative.  There is no history of any injury or trauma to the arm  HPI  Past Medical History:  Diagnosis Date  . Arthritis   . Obesity, Class III, BMI 40-49.9 (morbid obesity) ([place]) 04/27/2011    Patient Active Problem List   Diagnosis Date Noted  . Left ovarian cyst 08/28/2018  . Irregular bleeding 11/17/2014  . Obesity, Class III, BMI 40-49.9 (morbid obesity) ([place]) 04/27/2011    Past Surgical History:  Procedure Laterality Date  . ABDOMINAL HYSTERECTOMY    . BREATH TEK H PYLORI  05/19/2011   Procedure: BREATH TEK H PYLORI;  Surgeon: Pedro Earls, MD;  Location: [facility];  Service: General;  Laterality: N/A;  . [place]  . CYSTOSCOPY  11/17/2014   Procedure: CYSTOSCOPY;  Surgeon: Crawford Givens, MD;  Location: [facility];  Service: Gynecology;;  . LAPAROSCOPIC LYSIS OF ADHESIONS N/A 08/28/2018   Procedure: Laparoscopic Lysis Of Adhesions with repair of serosal tear of colon;  Surgeon: Crawford Givens, MD;  Location: [facility];  Service: Gynecology;  Laterality: N/A;  . TUBAL LIGATION      OB History   No obstetric history on file.      Home Medications    Prior to Admission medications   Medication Sig Start Date End Date Taking? Authorizing Provider  Ascorbic Acid (VITAMIN C) 1000 MG tablet Take 1,000 mg by mouth daily.   Yes [provider]  ibuprofen (ADVIL) 800 MG tablet Take 800 mg by mouth every 6 (six) hours as needed for headache or  moderate pain.    Yes [provider]  Multiple Vitamin (MULTIVITAMIN PO) Take 1 tablet by mouth daily.   Yes [provider]  oxyCODONE-acetaminophen (PERCOCET/ROXICET) 5-325 MG tablet take 1 tablet every 6 hours as needed for breakthrough post operative pain 08/28/18   Earnstine Regal, PA-C    Family History Family History  Problem Relation Age of Onset  . Diabetes Sister   . Diabetes Maternal Grandmother   . Diabetes Other     Social History Social History   Tobacco Use  . Smoking status: Never Smoker  . Smokeless tobacco: Never Used  Substance Use Topics  . Alcohol use: Yes    Comment: occasionally  . Drug use: No     Allergies   Patient has no known allergies.   Review of Systems Review of Systems  Musculoskeletal:       Pain in right elbow and forearm  All other systems reviewed and are negative.    Physical Exam Triage Vital Signs ED Triage Vitals  Enc Vitals Group     BP 11/23/18 1109 135/79     Pulse Rate 11/23/18 1109 69     Resp 11/23/18 1109 18     Temp 11/23/18 1109 97.9 F (36.6 C)     Temp Source 11/23/18 1109 Other     SpO2 11/23/18 1109 98 %  Weight --      Height --      Head Circumference --      Peak Flow --      Pain Score 11/23/18 1108 8     Pain Loc --      Pain Edu? --      Excl. in [place]? --    No data found.  Updated Vital Signs BP 135/79   Pulse 69   Temp 97.9 F (36.6 C) (Other (Comment))   Resp 18   LMP 08/09/2014   SpO2 98%   Visual Acuity Right Eye Distance:   Left Eye Distance:   Bilateral Distance:    Right Eye Near:   Left Eye Near:    Bilateral Near:     Physical Exam Vitals signs and nursing note reviewed.  Constitutional:      Appearance: She is obese.  Musculoskeletal:        General: Tenderness present.     Comments: Right arm: Maximally tender over lateral epicondyle. Pain increases with pronation and supination as well as resistance of dorsiflexion at the wrist.   Neurological:     Mental Status: She is alert.      UC Treatments / Results  Labs (all labs ordered are listed, but only abnormal results are displayed) Labs Reviewed - No data to display  EKG   Radiology No results found.  Procedures Procedures (including critical care time)  Medications Ordered in UC Medications - No data to display  Initial Impression / Assessment and Plan / UC Course  I have reviewed the triage vital signs and the nursing notes.  Pertinent labs & imaging results that were available during my care of the patient were reviewed by me and considered in my medical decision making (see chart for details).     Exam consistent with lateral epicondylitis. Final Clinical Impressions(s) / UC Diagnoses   Final diagnoses:  None   Discharge Instructions   None    ED Prescriptions    None     PDMP not reviewed this encounter.   Wardell Honour, MD 11/23/18 1128

## 2018-11-23 NOTE — ED Triage Notes (Signed)
C/O right arm pain x 2 wks without known injury.  States today pain much worse.  Pain starts in right proximal forearm and extends up to right posterior upper arm.  Area tender to palpation and movement.  CMS intact.

## 2018-11-29 ENCOUNTER — Ambulatory Visit
Admission: EM | Admit: 2018-11-29 | Discharge: 2018-11-29 | Disposition: A | Discharge status: Home / Self Care | Payer: Managed Care, Other (non HMO) | Attending: Physician Assistant | Admitting: Physician Assistant

## 2018-11-29 ENCOUNTER — Other Ambulatory Visit: Payer: Self-pay

## 2018-11-29 DIAGNOSIS — H02846 Edema of left eye, unspecified eyelid: Secondary | ICD-10-CM | POA: Diagnosis not present

## 2018-11-29 MED ORDER — CETIRIZINE HCL 10 MG PO TABS: 10.0000 mg | ORAL_TABLET | Freq: Every day | ORAL | 0 refills | Status: DC

## 2018-11-29 NOTE — ED Triage Notes (Signed)
Pt c/o swelling to upper lt eye lid x3 days

## 2018-11-29 NOTE — ED Provider Notes (Signed)
[facility]    CSN: [id number] Arrival date & time: 11/29/18  0919      History   Chief Complaint Chief Complaint  Patient presents with  . Facial Swelling    HPI Janice Terrell is a 43 y.o. female.   43 year old female comes in for 3 day history of left upper eyelid swelling.  She has had slight itching to the eyelid.  Denies erythema, warmth, pain.  Denies injury/trauma.  Denies eye drainage, eye redness, vision changes, double vision, photophobia.  Denies URI symptoms such as cough, congestion, sore throat.  Denies fever, chills, body aches.  She states swelling is worse in the morning, and slowly improves throughout the day.  She has also tried ice compress without much relief.     Past Medical History:  Diagnosis Date  . Arthritis   . Obesity, Class III, BMI 40-49.9 (morbid obesity) ([place]) 04/27/2011    Patient Active Problem List   Diagnosis Date Noted  . Left ovarian cyst 08/28/2018  . Irregular bleeding 11/17/2014  . Obesity, Class III, BMI 40-49.9 (morbid obesity) ([place]) 04/27/2011    Past Surgical History:  Procedure Laterality Date  . ABDOMINAL HYSTERECTOMY    . BREATH TEK H PYLORI  05/19/2011   Procedure: BREATH TEK H PYLORI;  Surgeon: Pedro Earls, MD;  Location: [facility];  Service: General;  Laterality: N/A;  . [place]  . CYSTOSCOPY  11/17/2014   Procedure: CYSTOSCOPY;  Surgeon: Crawford Givens, MD;  Location: [facility];  Service: Gynecology;;  . LAPAROSCOPIC LYSIS OF ADHESIONS N/A 08/28/2018   Procedure: Laparoscopic Lysis Of Adhesions with repair of serosal tear of colon;  Surgeon: Crawford Givens, MD;  Location: [facility];  Service: Gynecology;  Laterality: N/A;  . TUBAL LIGATION      OB History   No obstetric history on file.      Home Medications    Prior to Admission medications   Medication Sig Start Date End Date Taking? Authorizing Provider  Ascorbic Acid (VITAMIN C) 1000 MG tablet Take 1,000 mg by mouth  daily.    [provider]  cetirizine (ZYRTEC) 10 MG tablet Take 1 tablet (10 mg total) by mouth daily. 11/29/18   Tasia Catchings,  V, PA-C  ibuprofen (ADVIL) 800 MG tablet Take 800 mg by mouth every 6 (six) hours as needed for headache or moderate pain.     [provider]  meloxicam (MOBIC) 7.5 MG tablet Take 1 tablet (7.5 mg total) by mouth daily. 11/23/18   Wardell Honour, MD  Multiple Vitamin (MULTIVITAMIN PO) Take 1 tablet by mouth daily.    [provider]    Family History Family History  Problem Relation Age of Onset  . Diabetes Sister   . Diabetes Maternal Grandmother   . Diabetes Other     Social History Social History   Tobacco Use  . Smoking status: Never Smoker  . Smokeless tobacco: Never Used  Substance Use Topics  . Alcohol use: Yes    Comment: occasionally  . Drug use: No     Allergies   Patient has no known allergies.   Review of Systems Review of Systems  Reason unable to perform ROS: See HPI as above.     Physical Exam Triage Vital Signs ED Triage Vitals [11/29/18 0925]  Enc Vitals Group     BP 139/80     Pulse Rate 70     Resp 18     Temp  98.9 F (37.2 C)     Temp src      SpO2 98 %     Weight      Height      Head Circumference      Peak Flow      Pain Score 0     Pain Loc      Pain Edu?      Excl. in [place]?    No data found.  Updated Vital Signs BP 139/80 (BP Location: Left Arm)   Pulse 70   Temp 98.9 F (37.2 C)   Resp 18   LMP 08/09/2014   SpO2 98%   Physical Exam Constitutional:      General: She is not in acute distress.    Appearance: She is well-developed. She is not ill-appearing, toxic-appearing or diaphoretic.  HENT:     Head: Normocephalic and atraumatic.     Nose:     Right Sinus: No maxillary sinus tenderness or frontal sinus tenderness.     Left Sinus: No maxillary sinus tenderness or frontal sinus tenderness.  Eyes:     General: Lids are everted, no foreign bodies appreciated.      Extraocular Movements: Extraocular movements intact.     Conjunctiva/sclera: Conjunctivae normal.     Pupils: Pupils are equal, round, and reactive to light.     Comments: Swelling superior of the left upper eyelid, next to the eyebrows. No swelling along the eyelid/eyelash region. No erythema, warmth.   Neurological:     Mental Status: She is alert and oriented to person, place, and time.      UC Treatments / Results  Labs (all labs ordered are listed, but only abnormal results are displayed) Labs Reviewed - No data to display  EKG   Radiology No results found.  Procedures Procedures (including critical care time)  Medications Ordered in UC Medications - No data to display  Initial Impression / Assessment and Plan / UC Course  I have reviewed the triage vital signs and the nursing notes.  Pertinent labs & imaging results that were available during my care of the patient were reviewed by me and considered in my medical decision making (see chart for details).    Given swelling not directly along eyelash region, ?blepharitis. Discussed swelling could be from itching, will start zyrtec and have patient continue ice compress. Will have patient do lid scrubs. Return precautions given. Patient expresses understanding and agrees to plan.  Final Clinical Impressions(s) / UC Diagnoses   Final diagnoses:  Swelling of left eyelid    ED Prescriptions    Medication Sig Dispense Auth. Provider   cetirizine (ZYRTEC) 10 MG tablet Take 1 tablet (10 mg total) by mouth daily. 30 tablet Ok Edwards, PA-C     PDMP not reviewed this encounter.   Ok Edwards, PA-C 11/29/18 1204

## 2018-11-29 NOTE — Discharge Instructions (Signed)
Start zyrtec as directed. Lids scrubs, ice compress. Follow up for reevaluation if symptoms not improving. Monitor for any worsening of symptoms, changes in vision, sensitivity to light, eye swelling, painful eye movement, follow up with ophthalmology for further evaluation.

## 2018-11-30 ENCOUNTER — Telehealth: Payer: Self-pay | Admitting: Emergency Medicine

## 2018-11-30 NOTE — Telephone Encounter (Signed)
Checked in on patient, discussed medications, and encouraged return call with any continuing questions or concerns.    

## 2019-01-21 ENCOUNTER — Encounter (HOSPITAL_COMMUNITY): Payer: Self-pay

## 2019-01-21 ENCOUNTER — Ambulatory Visit (HOSPITAL_COMMUNITY)
Admission: EM | Admit: 2019-01-21 | Discharge: 2019-01-21 | Disposition: A | Discharge status: Home / Self Care | Payer: Managed Care, Other (non HMO) | Attending: Family Medicine | Admitting: Family Medicine

## 2019-01-21 DIAGNOSIS — Z20828 Contact with and (suspected) exposure to other viral communicable diseases: Secondary | ICD-10-CM | POA: Diagnosis not present

## 2019-01-21 DIAGNOSIS — M545 Low back pain, unspecified: Secondary | ICD-10-CM

## 2019-01-21 DIAGNOSIS — M25562 Pain in left knee: Secondary | ICD-10-CM | POA: Diagnosis not present

## 2019-01-21 DIAGNOSIS — Z791 Long term (current) use of non-steroidal anti-inflammatories (NSAID): Secondary | ICD-10-CM | POA: Insufficient documentation

## 2019-01-21 DIAGNOSIS — Z79899 Other long term (current) drug therapy: Secondary | ICD-10-CM | POA: Insufficient documentation

## 2019-01-21 DIAGNOSIS — J069 Acute upper respiratory infection, unspecified: Secondary | ICD-10-CM | POA: Insufficient documentation

## 2019-01-21 DIAGNOSIS — R0981 Nasal congestion: Secondary | ICD-10-CM | POA: Diagnosis not present

## 2019-01-21 DIAGNOSIS — R05 Cough: Secondary | ICD-10-CM | POA: Diagnosis present

## 2019-01-21 MED ORDER — BENZONATATE 200 MG PO CAPS: 200.0000 mg | ORAL_CAPSULE | Freq: Three times a day (TID) | ORAL | 0 refills | Status: AC | PRN

## 2019-01-21 MED ORDER — CYCLOBENZAPRINE HCL 5 MG PO TABS: 5.0000 mg | ORAL_TABLET | Freq: Two times a day (BID) | ORAL | 0 refills | Status: DC | PRN

## 2019-01-21 MED ORDER — FLUTICASONE PROPIONATE 50 MCG/ACT NA SUSP: 1.0000 | Freq: Every day | NASAL | 0 refills | Status: DC

## 2019-01-21 MED ORDER — KETOROLAC TROMETHAMINE 60 MG/2ML IM SOLN
60.0000 mg | Freq: Once | INTRAMUSCULAR | Status: AC
  Administered 2019-01-21: 17:00:00 60 mg via INTRAMUSCULAR

## 2019-01-21 MED ORDER — PREDNISONE 10 MG (21) PO TBPK: ORAL_TABLET | Freq: Every day | ORAL | 0 refills | Status: DC

## 2019-01-21 MED ORDER — KETOROLAC TROMETHAMINE 60 MG/2ML IM SOLN
INTRAMUSCULAR | Status: AC
  Filled 2019-01-21: qty 2

## 2019-01-21 NOTE — ED Triage Notes (Signed)
Pt presents to the UC with left leg pain, left knee,  lower back pain x 1 day; cough, nasal congestion x 2 days. Pt denies any numbness or tingling in her left leg. Pt states the nasal congestion is worse at night.

## 2019-01-21 NOTE — Discharge Instructions (Signed)
COVID swab pending, Monitor MyChart for results-should return in approximately 2 days, we will call if it is positive only Please quarantine until results return Please rest and drink plenty of fluids Begin Flonase nasal spray 1 to 2 spray in each nostril daily for congestion Tessalon every 8 hours as needed for cough  For back pain/knee pain we gave you a shot of Toradol today, continue with prednisone taper over the next 6 days.  Begin with 6 tablets, decrease by 1 tablet each day until completion.  Take with food and in the morning if you are able.  You may use flexeril as needed to help with pain. This is a muscle relaxer and causes sedation- please use only at bedtime or when you will be home and not have to drive/work  Gentle stretching of back  Please follow-up if any of your symptoms are not improving, worsening or changing

## 2019-01-22 NOTE — ED Provider Notes (Signed)
[facility]    CSN: [id number] Arrival date & time: 01/21/19  1509      History   Chief Complaint Chief Complaint  Patient presents with  . Leg Pain  . Back Pain  . Cough  . Nasal Congestion    HPI Janice Terrell is a 43 y.o. female history of arthritis presenting today for evaluation of URI symptoms and knee and back pain.  Patient states that she has had cough and nasal congestion over the past 2 days.  She denies any known fevers chills or body aches.  States that she just recently ended quarantining last week from possible exposure at work.  No new known exposures.  She is also reporting lower back and left knee pain.  Denies any injury fall or increase in activity.  She has tried meloxicam without relief.  Requesting injection of Toradol and symptoms helped her in the past.  She denies any numbness or tingling.  Denies any saddle anesthesia.  HPI  Past Medical History:  Diagnosis Date  . Arthritis   . Obesity, Class III, BMI 40-49.9 (morbid obesity) ([place]) 04/27/2011    Patient Active Problem List   Diagnosis Date Noted  . Left ovarian cyst 08/28/2018  . Irregular bleeding 11/17/2014  . Obesity, Class III, BMI 40-49.9 (morbid obesity) ([place]) 04/27/2011    Past Surgical History:  Procedure Laterality Date  . ABDOMINAL HYSTERECTOMY    . BREATH TEK H PYLORI  05/19/2011   Procedure: BREATH TEK H PYLORI;  Surgeon: Pedro Earls, MD;  Location: [facility];  Service: General;  Laterality: N/A;  . [place]  . CYSTOSCOPY  11/17/2014   Procedure: CYSTOSCOPY;  Surgeon: Crawford Givens, MD;  Location: [facility];  Service: Gynecology;;  . LAPAROSCOPIC LYSIS OF ADHESIONS N/A 08/28/2018   Procedure: Laparoscopic Lysis Of Adhesions with repair of serosal tear of colon;  Surgeon: Crawford Givens, MD;  Location: [facility];  Service: Gynecology;  Laterality: N/A;  . TUBAL LIGATION      OB History   No obstetric history on file.      Home Medications     Prior to Admission medications   Medication Sig Start Date End Date Taking? Authorizing Provider  meloxicam (MOBIC) 7.5 MG tablet Take 1 tablet (7.5 mg total) by mouth daily. 11/23/18  Yes Wardell Honour, MD  Ascorbic Acid (VITAMIN C) 1000 MG tablet Take 1,000 mg by mouth daily.    [provider]  benzonatate (TESSALON) 200 MG capsule Take 1 capsule (200 mg total) by mouth 3 (three) times daily as needed for up to 7 days for cough. 01/21/19 01/28/19  Laster Appling C, PA-C  cetirizine (ZYRTEC) 10 MG tablet Take 1 tablet (10 mg total) by mouth daily. 11/29/18   Tasia Catchings, Amy V, PA-C  cyclobenzaprine (FLEXERIL) 5 MG tablet Take 1-2 tablets (5-10 mg total) by mouth 2 (two) times daily as needed for muscle spasms. 01/21/19   Fredrick Geoghegan C, PA-C  fluticasone (FLONASE) 50 MCG/ACT nasal spray Place 1-2 sprays into both nostrils daily for 7 days. 01/21/19 01/28/19  Tristine Langi C, PA-C  ibuprofen (ADVIL) 800 MG tablet Take 800 mg by mouth every 6 (six) hours as needed for headache or moderate pain.     [provider]  Multiple Vitamin (MULTIVITAMIN PO) Take 1 tablet by mouth daily.    [provider]  predniSONE (STERAPRED UNI-PAK 21 TAB) 10 MG (21) TBPK tablet Take by mouth daily. Take as directed 01/21/19  Arneta Mahmood, Erwin C, PA-C    Family History Family History  Problem Relation Age of Onset  . Diabetes Sister   . Diabetes Maternal Grandmother   . Diabetes Other     Social History Social History   Tobacco Use  . Smoking status: Never Smoker  . Smokeless tobacco: Never Used  Substance Use Topics  . Alcohol use: Yes    Comment: occasionally  . Drug use: No     Allergies   Patient has no known allergies.   Review of Systems Review of Systems  Constitutional: Negative for activity change, appetite change, chills, fatigue and fever.  HENT: Positive for congestion and rhinorrhea. Negative for ear pain, sinus pressure, sore throat and trouble  swallowing.   Eyes: Negative for discharge and redness.  Respiratory: Positive for cough. Negative for chest tightness and shortness of breath.   Cardiovascular: Negative for chest pain.  Gastrointestinal: Negative for abdominal pain, diarrhea, nausea and vomiting.  Genitourinary: Negative for decreased urine volume and difficulty urinating.  Musculoskeletal: Positive for arthralgias, back pain and myalgias.  Skin: Negative for rash.  Neurological: Negative for dizziness, light-headedness and headaches.     Physical Exam Triage Vital Signs ED Triage Vitals  Enc Vitals Group     BP 01/21/19 1540 (!) 145/90     Pulse Rate 01/21/19 1540 75     Resp 01/21/19 1540 18     Temp 01/21/19 1540 98.5 F (36.9 C)     Temp Source 01/21/19 1540 Oral     SpO2 01/21/19 1540 100 %     Weight --      Height --      Head Circumference --      Peak Flow --      Pain Score 01/21/19 1538 10     Pain Loc --      Pain Edu? --      Excl. in [place]? --    No data found.  Updated Vital Signs BP (!) 145/90 (BP Location: Left Arm)   Pulse 75   Temp 98.5 F (36.9 C) (Oral)   Resp 18   LMP 08/09/2014   SpO2 100%   Visual Acuity Right Eye Distance:   Left Eye Distance:   Bilateral Distance:    Right Eye Near:   Left Eye Near:    Bilateral Near:     Physical Exam Vitals and nursing note reviewed.  Constitutional:      General: She is not in acute distress.    Appearance: She is well-developed.  HENT:     Head: Normocephalic and atraumatic.     Ears:     Comments: Bilateral ears without tenderness to palpation of external auricle, tragus and mastoid, EAC's without erythema or swelling, TM's with good bony landmarks and cone of light. Non erythematous.     Mouth/Throat:     Comments: Oral mucosa pink and moist, no tonsillar enlargement or exudate. Posterior pharynx patent and nonerythematous, no uvula deviation or swelling. Normal phonation. Eyes:     Conjunctiva/sclera: Conjunctivae  normal.  Cardiovascular:     Rate and Rhythm: Normal rate and regular rhythm.     Heart sounds: No murmur.  Pulmonary:     Effort: Pulmonary effort is normal. No respiratory distress.     Breath sounds: Normal breath sounds.     Comments: Breathing comfortably at rest, CTABL, no wheezing, rales or other adventitious sounds auscultated Abdominal:     Palpations: Abdomen is soft.  Tenderness: There is no abdominal tenderness.  Musculoskeletal:     Cervical back: Neck supple.     Comments: Nontender to palpation of cervical, thoracic spine, mild tenderness to palpation of lower lumbar spine midline, paraspinal musculature as well as bilateral lumbar musculature Strength 5/5 and equal bilaterally at hips and knees, negative straight leg raise Patellar reflex 1+ bilaterally  Skin:    General: Skin is warm and dry.  Neurological:     Mental Status: She is alert.      UC Treatments / Results  Labs (all labs ordered are listed, but only abnormal results are displayed) Labs Reviewed  NOVEL CORONAVIRUS, NAA (HOSP ORDER, SEND-OUT TO REF LAB; TAT 18-24 HRS)    EKG   Radiology No results found.  Procedures Procedures (including critical care time)  Medications Ordered in UC Medications  ketorolac (TORADOL) injection 60 mg (60 mg Intramuscular Given 01/21/19 1653)    Initial Impression / Assessment and Plan / UC Course  I have reviewed the triage vital signs and the nursing notes.  Pertinent labs & imaging results that were available during my care of the patient were reviewed by me and considered in my medical decision making (see chart for details).     Covid swab pending, recommending quarantining until results return.  Continue symptomatic and supportive care as likely viral etiology.  Providing Flonase for nasal congestion and Tessalon for cough.  Her back and deep knee pain, most likely MSK cause.  No red flags for cauda equina.  Possible arthritis medicine left knee.   Has been using NSAIDs without relief.  Will provide Toradol in clinic today followed by prednisone taper x6 days.  Flexeril as needed for back pain.  Gentle stretching and activity modification.  Continue to monitor,Discussed strict return precautions. Patient verbalized understanding and is agreeable with plan.  Final Clinical Impressions(s) / UC Diagnoses   Final diagnoses:  Viral URI with cough  Acute bilateral low back pain without sciatica  Acute pain of left knee     Discharge Instructions     COVID swab pending, Monitor MyChart for results-should return in approximately 2 days, we will call if it is positive only Please quarantine until results return Please rest and drink plenty of fluids Begin Flonase nasal spray 1 to 2 spray in each nostril daily for congestion Tessalon every 8 hours as needed for cough  For back pain/knee pain we gave you a shot of Toradol today, continue with prednisone taper over the next 6 days.  Begin with 6 tablets, decrease by 1 tablet each day until completion.  Take with food and in the morning if you are able.  You may use flexeril as needed to help with pain. This is a muscle relaxer and causes sedation- please use only at bedtime or when you will be home and not have to drive/work  Gentle stretching of back  Please follow-up if any of your symptoms are not improving, worsening or changing   ED Prescriptions    Medication Sig Dispense Auth. Provider   predniSONE (STERAPRED UNI-PAK 21 TAB) 10 MG (21) TBPK tablet Take by mouth daily. Take as directed 21 tablet Kriti Katayama C, PA-C   cyclobenzaprine (FLEXERIL) 5 MG tablet Take 1-2 tablets (5-10 mg total) by mouth 2 (two) times daily as needed for muscle spasms. 24 tablet Ryson Bacha C, PA-C   benzonatate (TESSALON) 200 MG capsule Take 1 capsule (200 mg total) by mouth 3 (three) times daily as needed for up  to 7 days for cough. 28 capsule Aashritha Miedema C, PA-C   fluticasone (FLONASE) 50  MCG/ACT nasal spray Place 1-2 sprays into both nostrils daily for 7 days. 1 g Adilynn Bessey, [place] C, PA-C     PDMP not reviewed this encounter.   Charlestine Rookstool, [place] C, PA-C 01/22/19 1020

## 2019-01-23 LAB — NOVEL CORONAVIRUS, NAA (HOSP ORDER, SEND-OUT TO REF LAB; TAT 18-24 HRS): SARS-CoV-2, NAA: NOT DETECTED

## 2019-01-24 ENCOUNTER — Other Ambulatory Visit: Payer: Self-pay

## 2019-01-24 ENCOUNTER — Emergency Department (HOSPITAL_COMMUNITY)
Admission: EM | Admit: 2019-01-24 | Discharge: 2019-01-24 | Disposition: A | Discharge status: Home / Self Care | Payer: Managed Care, Other (non HMO) | Attending: Emergency Medicine | Admitting: Emergency Medicine

## 2019-01-24 ENCOUNTER — Encounter (HOSPITAL_COMMUNITY): Payer: Self-pay | Admitting: Emergency Medicine

## 2019-01-24 ENCOUNTER — Emergency Department (HOSPITAL_COMMUNITY): Payer: Managed Care, Other (non HMO)

## 2019-01-24 DIAGNOSIS — Z79899 Other long term (current) drug therapy: Secondary | ICD-10-CM | POA: Insufficient documentation

## 2019-01-24 DIAGNOSIS — M25562 Pain in left knee: Secondary | ICD-10-CM

## 2019-01-24 MED ORDER — HYDROCODONE-ACETAMINOPHEN 5-325 MG PO TABS
2.0000 | ORAL_TABLET | Freq: Once | ORAL | Status: AC
  Administered 2019-01-24: 2 via ORAL
  Filled 2019-01-24: qty 2

## 2019-01-24 MED ORDER — ONDANSETRON HCL 4 MG PO TABS: 4.0000 mg | ORAL_TABLET | Freq: Four times a day (QID) | ORAL | 0 refills | Status: DC

## 2019-01-24 MED ORDER — IBUPROFEN 800 MG PO TABS: 800.0000 mg | ORAL_TABLET | Freq: Three times a day (TID) | ORAL | 0 refills | Status: DC | PRN

## 2019-01-24 MED ORDER — IBUPROFEN 800 MG PO TABS
800.0000 mg | ORAL_TABLET | Freq: Once | ORAL | Status: AC
  Administered 2019-01-24: 06:00:00 800 mg via ORAL
  Filled 2019-01-24: qty 1

## 2019-01-24 MED ORDER — HYDROCODONE-ACETAMINOPHEN 5-325 MG PO TABS: 2.0000 | ORAL_TABLET | Freq: Four times a day (QID) | ORAL | 0 refills | Status: DC | PRN

## 2019-01-24 MED ORDER — ONDANSETRON 4 MG PO TBDP
4.0000 mg | ORAL_TABLET | Freq: Once | ORAL | Status: AC
  Administered 2019-01-24: 05:00:00 4 mg via ORAL
  Filled 2019-01-24: qty 1

## 2019-01-24 NOTE — ED Provider Notes (Signed)
TIME SEEN: 4:49 AM  CHIEF COMPLAINT: Left knee pain  HPI: Patient is a 43 year old female with history of obesity who presents to the emergency department with left knee pain for several days.  Was recently seen in urgent care and was given muscle relaxers, prednisone taper and IM Toradol.  Reports no improvement in symptoms.  No known injury.  Able to ambulate.  No fever.  No previous issues with this knee in the past.  ROS: See HPI Constitutional: no fever  Eyes: no drainage  ENT: no runny nose   Cardiovascular:  no chest pain  Resp: no SOB  GI: no vomiting GU: no dysuria Integumentary: no rash  Allergy: no hives  Musculoskeletal: no leg swelling  Neurological: no slurred speech ROS otherwise negative  PAST MEDICAL HISTORY/PAST SURGICAL HISTORY:  Past Medical History:  Diagnosis Date  . Arthritis   . Obesity, Class III, BMI 40-49.9 (morbid obesity) ([place]) 04/27/2011    MEDICATIONS:  Prior to Admission medications   Medication Sig Start Date End Date Taking? Authorizing Provider  Ascorbic Acid (VITAMIN C) 1000 MG tablet Take 1,000 mg by mouth daily.    [provider]  benzonatate (TESSALON) 200 MG capsule Take 1 capsule (200 mg total) by mouth 3 (three) times daily as needed for up to 7 days for cough. 01/21/19 01/28/19  Wieters, Hallie C, PA-C  cetirizine (ZYRTEC) 10 MG tablet Take 1 tablet (10 mg total) by mouth daily. 11/29/18   Tasia Catchings, Amy V, PA-C  cyclobenzaprine (FLEXERIL) 5 MG tablet Take 1-2 tablets (5-10 mg total) by mouth 2 (two) times daily as needed for muscle spasms. 01/21/19   Wieters, Hallie C, PA-C  fluticasone (FLONASE) 50 MCG/ACT nasal spray Place 1-2 sprays into both nostrils daily for 7 days. 01/21/19 01/28/19  Wieters, Hallie C, PA-C  ibuprofen (ADVIL) 800 MG tablet Take 800 mg by mouth every 6 (six) hours as needed for headache or moderate pain.     [provider]  meloxicam (MOBIC) 7.5 MG tablet Take 1 tablet (7.5 mg total) by mouth daily.  11/23/18   Wardell Honour, MD  Multiple Vitamin (MULTIVITAMIN PO) Take 1 tablet by mouth daily.    [provider]  predniSONE (STERAPRED UNI-PAK 21 TAB) 10 MG (21) TBPK tablet Take by mouth daily. Take as directed 01/21/19   Wieters, Elesa Hacker, PA-C    ALLERGIES:  No Known Allergies  SOCIAL HISTORY:  Social History   Tobacco Use  . Smoking status: Never Smoker  . Smokeless tobacco: Never Used  Substance Use Topics  . Alcohol use: Yes    Comment: occasionally    FAMILY HISTORY: Family History  Problem Relation Age of Onset  . Diabetes Sister   . Diabetes Maternal Grandmother   . Diabetes Other     EXAM: BP (!) 136/99 (BP Location: Left Arm)   Pulse 78   Temp 98 F (36.7 C) (Oral)   Resp 17   LMP 08/09/2014   SpO2 99%  CONSTITUTIONAL: Alert and oriented and responds appropriately to questions. Well-appearing; well-nourished HEAD: Normocephalic EYES: Conjunctivae clear, pupils appear equal, EOM appear intact ENT: normal nose; moist mucous membranes NECK: Supple, normal ROM CARD: RRR; S1 and S2 appreciated; no murmurs, no clicks, no rubs, no gallops RESP: Normal chest excursion without splinting or tachypnea; breath sounds clear and equal bilaterally; no wheezes, no rhonchi, no rales, no hypoxia or respiratory distress, speaking full sentences ABD/GI: Normal bowel sounds; non-distended; soft, non-tender, no rebound, no guarding, no peritoneal  signs, no hepatosplenomegaly BACK:  The back appears normal EXT: Normal ROM in all joints; no deformity noted, no edema; no cyanosis, pain with flexion of the left knee, tender over the anterior joint line of the left knee, no redness or warmth of the left knee, no calf tenderness or calf swelling, 2+ left DP pulse on the left, no ligamentous laxity of the left knee, compartments soft SKIN: Normal color for age and race; warm; no rash on exposed skin NEURO: Moves all extremities equally PSYCH: The patient's mood and manner  are appropriate.   MEDICAL DECISION MAKING: Patient here with left knee pain.  No known injury.  Will obtain x-ray to rule out occult fracture.  No sign of septic arthritis, gout, DVT, arterial obstruction, compartment syndrome.  Will provide with Vicodin.  She states she has someone to drive her home from the ED.  States she did not drive herself today.  ED PROGRESS: X-ray shows no fracture, effusion, dislocation.  She reports she is able to ambulate.  Will apply knee sleeve.  Reports minimal improvement after hydrocodone.  Will give ibuprofen.  Recommended rest, elevation and ice.  Will give orthopedic follow-up if conservative management is not helping.  Patient verbalized understanding.  At this time, I do not feel there is any life-threatening condition present. I have reviewed, interpreted and discussed all results (EKG, imaging, lab, urine as appropriate) and exam findings with patient/family. I have reviewed nursing notes and appropriate previous records.  I feel the patient is safe to be discharged home without further emergent workup and can continue workup as an outpatient as needed. Discussed usual and customary return precautions. Patient/family verbalize understanding and are comfortable with this plan.  Outpatient follow-up has been provided as needed. All questions have been answered.      Dagmar Hait Denetrice Starck was evaluated in Emergency Department on 01/24/2019 for the symptoms described in the history of present illness. She was evaluated in the context of the global COVID-19 pandemic, which necessitated consideration that the patient might be at risk for infection with the SARS-CoV-2 virus that causes COVID-19. Institutional protocols and algorithms that pertain to the evaluation of patients at risk for COVID-19 are in a state of rapid change based on information released by regulatory bodies including the CDC and federal and state organizations. These policies and algorithms were  followed during the patient's care in the ED.  Patient was seen wearing N95, face shield, gloves.    Cheryl Stabenow, Delice Bison, DO 01/24/19 [phone]

## 2019-01-24 NOTE — ED Triage Notes (Signed)
Patient reports persistent left knee pain onset this week unrelieved by prescription pain medication prescribed at [facility] last Monday. Ambulatory/denies injury.

## 2019-02-09 ENCOUNTER — Emergency Department (HOSPITAL_BASED_OUTPATIENT_CLINIC_OR_DEPARTMENT_OTHER): Payer: Managed Care, Other (non HMO)

## 2019-02-09 ENCOUNTER — Other Ambulatory Visit: Payer: Self-pay

## 2019-02-09 ENCOUNTER — Encounter (HOSPITAL_BASED_OUTPATIENT_CLINIC_OR_DEPARTMENT_OTHER): Payer: Self-pay

## 2019-02-09 ENCOUNTER — Emergency Department (HOSPITAL_BASED_OUTPATIENT_CLINIC_OR_DEPARTMENT_OTHER)
Admission: EM | Admit: 2019-02-09 | Discharge: 2019-02-09 | Disposition: A | Discharge status: Home / Self Care | Payer: Managed Care, Other (non HMO) | Attending: Emergency Medicine | Admitting: Emergency Medicine

## 2019-02-09 DIAGNOSIS — Z79899 Other long term (current) drug therapy: Secondary | ICD-10-CM | POA: Diagnosis not present

## 2019-02-09 DIAGNOSIS — M79662 Pain in left lower leg: Secondary | ICD-10-CM | POA: Diagnosis present

## 2019-02-09 DIAGNOSIS — R2242 Localized swelling, mass and lump, left lower limb: Secondary | ICD-10-CM | POA: Insufficient documentation

## 2019-02-09 DIAGNOSIS — M79605 Pain in left leg: Secondary | ICD-10-CM

## 2019-02-09 MED ORDER — NAPROXEN 500 MG PO TABS: 500.0000 mg | ORAL_TABLET | Freq: Two times a day (BID) | ORAL | 0 refills | Status: DC

## 2019-02-09 NOTE — ED Notes (Signed)
Patient transported to Ultrasound 

## 2019-02-09 NOTE — ED Triage Notes (Signed)
Pt arrives ambulatory POV with c/o left leg pain X 2 weeks.

## 2019-02-09 NOTE — ED Provider Notes (Signed)
[facility] EMERGENCY DEPARTMENT Provider Note   CSN: [id number] Arrival date & time: 02/09/19  1438     History Chief Complaint  Patient presents with  . Leg Pain    Janice Terrell is a 44 y.o. female with a hx of obesity who returns to the ED with complaints of left leg pain x 2 weeks. Patient states pain is located to the L knee & proximal left lower leg, it is constant, aching/burning, no alleviating/aggravating factors. She feels there is associated swelling. She has concern for a blood clot. Denies recent change in activity or injury. Denies fever, chills, erythema, warmth, numbness, tingling, or weakness. Denies chest pain, dyspnea, hemoptysis, recent surgery/trauma, recent long travel, hormone use, personal hx of cancer, or hx of DVT/PE. She has not followed up with orthopedics since her last ED visit.      HPI     Past Medical History:  Diagnosis Date  . Arthritis   . Obesity, Class III, BMI 40-49.9 (morbid obesity) ([place]) 04/27/2011    Patient Active Problem List   Diagnosis Date Noted  . Left ovarian cyst 08/28/2018  . Irregular bleeding 11/17/2014  . Obesity, Class III, BMI 40-49.9 (morbid obesity) ([place]) 04/27/2011    Past Surgical History:  Procedure Laterality Date  . ABDOMINAL HYSTERECTOMY    . BREATH TEK H PYLORI  05/19/2011   Procedure: BREATH TEK H PYLORI;  Surgeon: Pedro Earls, MD;  Location: [facility];  Service: General;  Laterality: N/A;  . [place]  . CYSTOSCOPY  11/17/2014   Procedure: CYSTOSCOPY;  Surgeon: Crawford Givens, MD;  Location: [facility];  Service: Gynecology;;  . LAPAROSCOPIC LYSIS OF ADHESIONS N/A 08/28/2018   Procedure: Laparoscopic Lysis Of Adhesions with repair of serosal tear of colon;  Surgeon: Crawford Givens, MD;  Location: [facility];  Service: Gynecology;  Laterality: N/A;  . TUBAL LIGATION       OB History   No obstetric history on file.     Family History  Problem Relation Age of Onset  .  Diabetes Sister   . Diabetes Maternal Grandmother   . Diabetes Other     Social History   Tobacco Use  . Smoking status: Never Smoker  . Smokeless tobacco: Never Used  Substance Use Topics  . Alcohol use: Yes    Comment: occasionally  . Drug use: No    Home Medications Prior to Admission medications   Medication Sig Start Date End Date Taking? Authorizing Provider  Ascorbic Acid (VITAMIN C) 1000 MG tablet Take 1,000 mg by mouth daily.    [provider]  cetirizine (ZYRTEC) 10 MG tablet Take 1 tablet (10 mg total) by mouth daily. 11/29/18   Tasia Catchings, Amy V, PA-C  cyclobenzaprine (FLEXERIL) 5 MG tablet Take 1-2 tablets (5-10 mg total) by mouth 2 (two) times daily as needed for muscle spasms. 01/21/19   Wieters, Hallie C, PA-C  fluticasone (FLONASE) 50 MCG/ACT nasal spray Place 1-2 sprays into both nostrils daily for 7 days. 01/21/19 01/28/19  Wieters, Hallie C, PA-C  HYDROcodone-acetaminophen (NORCO/VICODIN) 5-325 MG tablet Take 2 tablets by mouth every 6 (six) hours as needed. 01/24/19   Ward, Delice Bison, DO  ibuprofen (ADVIL) 800 MG tablet Take 1 tablet (800 mg total) by mouth every 8 (eight) hours as needed for mild pain. 01/24/19   Ward, Delice Bison, DO  meloxicam (MOBIC) 7.5 MG tablet Take 1 tablet (7.5 mg total) by mouth daily. 11/23/18   Wardell Honour,  MD  Multiple Vitamin (MULTIVITAMIN PO) Take 1 tablet by mouth daily.    [provider]  ondansetron (ZOFRAN) 4 MG tablet Take 1 tablet (4 mg total) by mouth every 6 (six) hours. 01/24/19   Ward, Delice Bison, DO  predniSONE (STERAPRED UNI-PAK 21 TAB) 10 MG (21) TBPK tablet Take by mouth daily. Take as directed 01/21/19   Wieters, Madelynn Done C, PA-C    Allergies    Patient has no known allergies.  Review of Systems   Review of Systems  Constitutional: Negative for chills and fever.  Respiratory: Negative for shortness of breath.   Cardiovascular: Positive for leg swelling. Negative for chest pain.  Gastrointestinal:  Negative for abdominal pain, nausea and vomiting.  Musculoskeletal: Positive for arthralgias and myalgias.  Skin: Negative for color change and wound.  Neurological: Negative for weakness and numbness.    Physical Exam Updated Vital Signs BP (!) 149/92 (BP Location: Left Arm)   Pulse 80   Temp 98.8 F (37.1 C) (Oral)   Resp 18   Ht 5\' 2"  (1.575 m)   Wt 127 kg   LMP 08/09/2014   SpO2 96%   BMI 51.21 kg/m   Physical Exam Vitals and nursing note reviewed.  Constitutional:      General: She is not in acute distress.    Appearance: She is not ill-appearing or toxic-appearing.  HENT:     Head: Normocephalic and atraumatic.  Cardiovascular:     Pulses:          Dorsalis pedis pulses are 2+ on the right side and 2+ on the left side.       Posterior tibial pulses are 2+ on the right side and 2+ on the left side.  Pulmonary:     Effort: Pulmonary effort is normal.  Musculoskeletal:     Comments: Lower extremities: No obvious deformity, appreciable swelling, edema, erythema, ecchymosis, warmth, or open wounds. Patient has intact AROM to bilateral hips, knees, ankles, and all digits. LLE: tender to palpation to the proximal calf, popliteal fossa, as well as to the anteromedial aspect of the knee including the patella and the medial joint line. Otherwise nontender. Compartments are soft.   Skin:    General: Skin is warm and dry.     Capillary Refill: Capillary refill takes less than 2 seconds.  Neurological:     Mental Status: She is alert.     Comments: Alert. Clear speech. Sensation grossly intact to bilateral lower extremities. 5/5 strength with knee flexion/extension & plantar/dorsiflexion bilaterally. Patient ambulatory.   Psychiatric:        Mood and Affect: Mood normal.        Behavior: Behavior normal.     ED Results / Procedures / Treatments   Labs (all labs ordered are listed, but only abnormal results are displayed) Labs Reviewed - No data to  display  EKG None  Radiology No results found.  Procedures Procedures (including critical care time)  Medications Ordered in ED Medications - No data to display  ED Course  I have reviewed the triage vital signs and the nursing notes.  Pertinent labs & imaging results that were available during my care of the patient were reviewed by me and considered in my medical decision making (see chart for details).    MDM Rules/Calculators/A&P                     Patient returns to the ED with complaints of LLE pain/swelling x 2  weeks. Nontoxic, vitals WNL with the exception of elevated BP- doubt HTN emergency.  No fever, erythema, warmth, or wounds- not consistent w/ infectious process such as septic joint, osteomyelitis, or cellulitis. Xray from prior visit without fx/dislocation, no recent trauma, do not feel this needs repeating. Venous duplex negative for DVT. Not consistent w/ compartment syndrome. NVI distally. Unclear definitive etiology, overall appears safe for outpatient follow up with orthopedics as initially discussed. Trial naproxen. I discussed results, treatment plan, need for follow-up, and return precautions with the patient. Provided opportunity for questions, patient confirmed understanding and is in agreement with plan.   Final Clinical Impression(s) / ED Diagnoses Final diagnoses:  Left leg pain    Rx / DC Orders ED Discharge Orders         Ordered    naproxen (NAPROSYN) 500 MG tablet  2 times daily     02/09/19 1746           Jasmaine Rochel, Glynda Jaeger, PA-C 02/09/19 1748    Lajean Saver, MD 02/09/19 2014

## 2019-02-09 NOTE — Discharge Instructions (Addendum)
Please read and follow all provided instructions.  You have been seen today for left leg pain Your ultrasound did not show a blood clot  Medications:  - Naproxen is a nonsteroidal anti-inflammatory medication that will help with pain and swelling. Be sure to take this medication as prescribed with food, 1 pill every 12 hours,  It should be taken with food, as it can cause stomach upset, and more seriously, stomach bleeding. Do not take other nonsteroidal anti-inflammatory medications with this such as Advil, Motrin, Aleve, Mobic, Goodie Powder, or Motrin.    You make take Tylenol per over the counter dosing with these medications.   We have prescribed you new medication(s) today. Discuss the medications prescribed today with your pharmacist as they can have adverse effects and interactions with your other medicines including over the counter and prescribed medications. Seek medical evaluation if you start to experience new or abnormal symptoms after taking one of these medicines, seek care immediately if you start to experience difficulty breathing, feeling of your throat closing, facial swelling, or rash as these could be indications of a more serious allergic reaction   Follow-up instructions: Please follow-up with sports medicine within 3 days for further evaluation.   Return instructions:  Please return if your digits or extremity are numb or tingling, appear gray or blue, or you have severe pain (also elevate the extremity and loosen splint or wrap if you were given one) Please return if you have redness or fevers.  Please return to the Emergency Department if you experience worsening symptoms.  Please return if you have any other emergent concerns. Additional Information:  Your vital signs today were: BP (!) 149/92 (BP Location: Left Arm)   Pulse 80   Temp 98.8 F (37.1 C) (Oral)   Resp 18   Ht 5\' 2"  (1.575 m)   Wt 127 kg   LMP 08/09/2014   SpO2 96%   BMI 51.21 kg/m  If your  blood pressure (BP) was elevated above 135/85 this visit, please have this repeated by your doctor within one month. ---------------

## 2019-02-09 NOTE — ED Notes (Signed)
Pt ambulatory to treatment room with steady gait.  

## 2019-02-21 ENCOUNTER — Ambulatory Visit (INDEPENDENT_AMBULATORY_CARE_PROVIDER_SITE_OTHER): Payer: Managed Care, Other (non HMO) | Admitting: Family Medicine

## 2019-02-21 ENCOUNTER — Other Ambulatory Visit: Payer: Self-pay

## 2019-02-21 ENCOUNTER — Ambulatory Visit: Payer: Self-pay

## 2019-02-21 ENCOUNTER — Encounter: Payer: Self-pay | Admitting: Family Medicine

## 2019-02-21 VITALS — Ht 62.0 in | Wt 280.0 lb

## 2019-02-21 DIAGNOSIS — M1712 Unilateral primary osteoarthritis, left knee: Secondary | ICD-10-CM

## 2019-02-21 DIAGNOSIS — M222X2 Patellofemoral disorders, left knee: Secondary | ICD-10-CM

## 2019-02-21 MED ORDER — PENNSAID 2 % EX SOLN: 1.0000 "application " | Freq: Two times a day (BID) | CUTANEOUS | 3 refills | Status: DC

## 2019-02-21 MED ORDER — TRIAMCINOLONE ACETONIDE 40 MG/ML IJ SUSP
40.0000 mg | Freq: Once | INTRAMUSCULAR | Status: AC
  Administered 2019-02-21: 40 mg via INTRA_ARTICULAR

## 2019-02-21 NOTE — Assessment & Plan Note (Signed)
Has a large effusion on ultrasound.  Seems more related to degenerative changes.  Likely has component of patellofemoral syndrome as well. -Injection today. -Pennsaid. -Counseled on home exercise therapy and supportive care. -Could consider updated imaging or physical therapy.  Could consider gel injections.

## 2019-02-21 NOTE — Progress Notes (Signed)
Janice Terrell - 44 y.o. female MRN [id number]  Date of birth: [birth year]  SUBJECTIVE:  Including CC & ROS.  Chief Complaint  Patient presents with  . Knee Pain    left knee    Janice Terrell is a 44 y.o. female that is presenting with acute left knee pain.  Denies any specific inciting event.  The pain is been present for 2 to 3 months.  Seems anterior and medial in nature.  No buckling or mechanical symptoms.  Has not tried any medications.  No prior surgery.  Independent review of the left knee x-ray from 12/25 shows no acute abnormality.  Review of the duplex from 1/10 shows no DVT.   Review of Systems See HPI   HISTORY: Past Medical, Surgical, Social, and Family History Reviewed & Updated per EMR.   Pertinent Historical Findings include:  Past Medical History:  Diagnosis Date  . Arthritis   . Obesity, Class III, BMI 40-49.9 (morbid obesity) ([place]) 04/27/2011    Past Surgical History:  Procedure Laterality Date  . ABDOMINAL HYSTERECTOMY    . BREATH TEK H PYLORI  05/19/2011   Procedure: BREATH TEK H PYLORI;  Surgeon: Pedro Earls, MD;  Location: [facility];  Service: General;  Laterality: N/A;  . [place]  . CYSTOSCOPY  11/17/2014   Procedure: CYSTOSCOPY;  Surgeon: Crawford Givens, MD;  Location: [facility];  Service: Gynecology;;  . LAPAROSCOPIC LYSIS OF ADHESIONS N/A 08/28/2018   Procedure: Laparoscopic Lysis Of Adhesions with repair of serosal tear of colon;  Surgeon: Crawford Givens, MD;  Location: [facility];  Service: Gynecology;  Laterality: N/A;  . TUBAL LIGATION      No Known Allergies  Family History  Problem Relation Age of Onset  . Diabetes Sister   . Diabetes Maternal Grandmother   . Diabetes Other      Social History   Socioeconomic History  . Marital status: Married    Spouse name: Not on file  . Number of children: Not on file  . Years of education: Not on file  . Highest education level: Not on file  Occupational History    . Not on file  Tobacco Use  . Smoking status: Never Smoker  . Smokeless tobacco: Never Used  Substance and Sexual Activity  . Alcohol use: Yes    Comment: occasionally  . Drug use: No  . Sexual activity: Not on file  Other Topics Concern  . Not on file  Social History Narrative  . Not on file   Social Determinants of Health   Financial Resource Strain:   . Difficulty of Paying Living Expenses: Not on file  Food Insecurity:   . Worried About Charity fundraiser in the Last Year: Not on file  . Ran Out of Food in the Last Year: Not on file  Transportation Needs:   . Lack of Transportation (Medical): Not on file  . Lack of Transportation (Non-Medical): Not on file  Physical Activity:   . Days of Exercise per Week: Not on file  . Minutes of Exercise per Session: Not on file  Stress:   . Feeling of Stress : Not on file  Social Connections:   . Frequency of Communication with Friends and Family: Not on file  . Frequency of Social Gatherings with Friends and Family: Not on file  . Attends Religious Services: Not on file  . Active Member of Clubs or Organizations: Not on file  . Attends Club or  Organization Meetings: Not on file  . Marital Status: Not on file  Intimate Partner Violence:   . Fear of Current or Ex-Partner: Not on file  . Emotionally Abused: Not on file  . Physically Abused: Not on file  . Sexually Abused: Not on file     PHYSICAL EXAM:  VS: Ht 5\' 2"  (1.575 m)   Wt 280 lb (127 kg)   LMP 08/09/2014   BMI 51.21 kg/m  Physical Exam Gen: NAD, alert, cooperative with exam, well-appearing ENT: normal lips, normal nasal mucosa,  Eye: normal EOM, normal conjunctiva and lids Skin: no rashes, no areas of induration  Neuro: normal tone, normal sensation to touch Psych:  normal insight, alert and oriented MSK:  Left knee: No obvious effusion. Normal range of motion. Some tenderness to palpation over the medial joint line. No instability with valgus or varus  stress testing. Negative McMurray's test. Pain with patellar grind. Neurovascularly intact   Aspiration/Injection Procedure Note Janice Terrell 1975-08-23  Procedure: Injection Indications: Left knee pain  Procedure Details Consent: Risks of procedure as well as the alternatives and risks of each were explained to the (patient/caregiver).  Consent for procedure obtained. Time Out: Verified patient identification, verified procedure, site/side was marked, verified correct patient position, special equipment/implants available, medications/allergies/relevent history reviewed, required imaging and test results available.  Performed.  The area was cleaned with iodine and alcohol swabs.    The left knee superior lateral suprapatellar pouch was injected using 1 cc's of 40 mg Kenalog and 4 cc's of 0.25% bupivacaine with a 22 1 1/2" needle.  Ultrasound was used. Images were obtained in long views showing the injection.     A sterile dressing was applied.  Patient did tolerate procedure well.      ASSESSMENT & PLAN:   Primary osteoarthritis of left knee Has a large effusion on ultrasound.  Seems more related to degenerative changes.  Likely has component of patellofemoral syndrome as well. -Injection today. -Pennsaid. -Counseled on home exercise therapy and supportive care. -Could consider updated imaging or physical therapy.  Could consider gel injections.

## 2019-02-21 NOTE — Patient Instructions (Signed)
Nice to meet you Please try ice  Please try the rub on medicine  Please try the exercises   Please send me a message in MyChart with any questions or updates.  Please see me back in 4-6 weeks.   --Dr. Raeford Razor

## 2019-03-21 ENCOUNTER — Other Ambulatory Visit: Payer: Self-pay

## 2019-03-21 ENCOUNTER — Encounter: Payer: Self-pay | Admitting: Family Medicine

## 2019-03-21 ENCOUNTER — Ambulatory Visit (INDEPENDENT_AMBULATORY_CARE_PROVIDER_SITE_OTHER): Payer: Managed Care, Other (non HMO) | Admitting: Family Medicine

## 2019-03-21 DIAGNOSIS — M1712 Unilateral primary osteoarthritis, left knee: Secondary | ICD-10-CM

## 2019-03-21 MED ORDER — PREDNISONE 5 MG PO TABS: ORAL_TABLET | ORAL | 0 refills | Status: DC

## 2019-03-21 MED ORDER — HYDROCODONE-ACETAMINOPHEN 5-325 MG PO TABS: 1.0000 | ORAL_TABLET | Freq: Three times a day (TID) | ORAL | 0 refills | Status: DC | PRN

## 2019-03-21 NOTE — Patient Instructions (Signed)
Good to see you Please try ice  Please try tylenol  Please try the brace  Please use the norco for severe pain   Please send me a message in MyChart with any questions or updates.  We will call once the gel injection is in.   --Dr. Raeford Razor

## 2019-03-21 NOTE — Assessment & Plan Note (Signed)
Acute worsening of left knee pain.  Occurring worse at night.  Likely degenerative as the contribution. -Hinged knee brace. -Prednisone. -Norco. -Counseled on home exercise therapy and supportive care. -Will try gel injection.

## 2019-03-21 NOTE — Progress Notes (Signed)
Janice Terrell - 44 y.o. female MRN [id number]  Date of birth: Jun 28, 1975  SUBJECTIVE:  Including CC & ROS.  Chief Complaint  Patient presents with  . Follow-up    follow up for left knee    [name] is a 44 y.o. female that is presenting with acute worsening of left knee pain.  She received a steroid injection and had some mild improvement.  The pain is occurring over the medial joint line predominantly.  She feels the pain with most ambulation or moving.  It also is worse at night when she lies down.  Has tried heating and icing.  Has been doing ibuprofen.   Review of Systems See HPI   HISTORY: Past Medical, Surgical, Social, and Family History Reviewed & Updated per EMR.   Pertinent Historical Findings include:  Past Medical History:  Diagnosis Date  . Arthritis   . Obesity, Class III, BMI 40-49.9 (morbid obesity) ([place]) 04/27/2011    Past Surgical History:  Procedure Laterality Date  . ABDOMINAL HYSTERECTOMY    . BREATH TEK H PYLORI  05/19/2011   Procedure: BREATH TEK H PYLORI;  Surgeon: Pedro Earls, MD;  Location: [facility];  Service: General;  Laterality: N/A;  . [place]  . CYSTOSCOPY  11/17/2014   Procedure: CYSTOSCOPY;  Surgeon: Crawford Givens, MD;  Location: [facility];  Service: Gynecology;;  . LAPAROSCOPIC LYSIS OF ADHESIONS N/A 08/28/2018   Procedure: Laparoscopic Lysis Of Adhesions with repair of serosal tear of colon;  Surgeon: Crawford Givens, MD;  Location: [facility];  Service: Gynecology;  Laterality: N/A;  . TUBAL LIGATION      Family History  Problem Relation Age of Onset  . Diabetes Sister   . Diabetes Maternal Grandmother   . Diabetes Other     Social History   Socioeconomic History  . Marital status: Married    Spouse name: Not on file  . Number of children: Not on file  . Years of education: Not on file  . Highest education level: Not on file  Occupational History  . Not on file  Tobacco Use  . Smoking status:  Never Smoker  . Smokeless tobacco: Never Used  Substance and Sexual Activity  . Alcohol use: Yes    Comment: occasionally  . Drug use: No  . Sexual activity: Not on file  Other Topics Concern  . Not on file  Social History Narrative  . Not on file   Social Determinants of Health   Financial Resource Strain:   . Difficulty of Paying Living Expenses: Not on file  Food Insecurity:   . Worried About Charity fundraiser in the Last Year: Not on file  . Ran Out of Food in the Last Year: Not on file  Transportation Needs:   . Lack of Transportation (Medical): Not on file  . Lack of Transportation (Non-Medical): Not on file  Physical Activity:   . Days of Exercise per Week: Not on file  . Minutes of Exercise per Session: Not on file  Stress:   . Feeling of Stress : Not on file  Social Connections:   . Frequency of Communication with Friends and Family: Not on file  . Frequency of Social Gatherings with Friends and Family: Not on file  . Attends Religious Services: Not on file  . Active Member of Clubs or Organizations: Not on file  . Attends Archivist Meetings: Not on file  . Marital Status: Not on file  Intimate Partner Violence:   . Fear of Current or Ex-Partner: Not on file  . Emotionally Abused: Not on file  . Physically Abused: Not on file  . Sexually Abused: Not on file     PHYSICAL EXAM:  VS: Ht 5\' 2"  (1.575 m)   Wt 275 lb (124.7 kg)   LMP 08/09/2014   BMI 50.30 kg/m  Physical Exam Gen: NAD, alert, cooperative with exam, well-appearing MSK:  Left knee: No obvious effusion. Normal range of motion. No instability with valgus varus stress testing. Tenderness palpation of the medial joint line. Negative McMurray's test. Neurovascular intact     ASSESSMENT & PLAN:   Primary osteoarthritis of left knee Acute worsening of left knee pain.  Occurring worse at night.  Likely degenerative as the contribution. -Hinged knee  brace. -Prednisone. -Norco. -Counseled on home exercise therapy and supportive care. -Will try gel injection.

## 2019-04-04 ENCOUNTER — Encounter: Payer: Self-pay | Admitting: Family Medicine

## 2019-04-04 ENCOUNTER — Ambulatory Visit (INDEPENDENT_AMBULATORY_CARE_PROVIDER_SITE_OTHER): Payer: Managed Care, Other (non HMO) | Admitting: Family Medicine

## 2019-04-04 ENCOUNTER — Other Ambulatory Visit: Payer: Self-pay

## 2019-04-04 ENCOUNTER — Ambulatory Visit: Payer: Self-pay

## 2019-04-04 VITALS — BP 144/94 | Ht 62.0 in | Wt 280.0 lb

## 2019-04-04 DIAGNOSIS — M1712 Unilateral primary osteoarthritis, left knee: Secondary | ICD-10-CM

## 2019-04-04 NOTE — Assessment & Plan Note (Signed)
Ongoing knee pain  - gel injection today  - counseled on HEp and supportive care - consider PT or further imaging.

## 2019-04-04 NOTE — Progress Notes (Signed)
Janice Terrell - 44 y.o. female MRN [id number]  Date of birth: [birth year]  SUBJECTIVE:  Including CC & ROS.  No chief complaint on file.   Janice Terrell is a 44 y.o. female that is presenting with left knee pain. Here for gel injection.    Review of Systems See HPI   HISTORY: Past Medical, Surgical, Social, and Family History Reviewed & Updated per EMR.   Pertinent Historical Findings include:  Past Medical History:  Diagnosis Date  . Arthritis   . Obesity, Class III, BMI 40-49.9 (morbid obesity) ([place]) 04/27/2011    Past Surgical History:  Procedure Laterality Date  . ABDOMINAL HYSTERECTOMY    . BREATH TEK H PYLORI  05/19/2011   Procedure: BREATH TEK H PYLORI;  Surgeon: Janice Earls, MD;  Location: [facility];  Service: General;  Laterality: N/A;  . [place]  . CYSTOSCOPY  11/17/2014   Procedure: CYSTOSCOPY;  Surgeon: Janice Givens, MD;  Location: [facility];  Service: Gynecology;;  . LAPAROSCOPIC LYSIS OF ADHESIONS N/A 08/28/2018   Procedure: Laparoscopic Lysis Of Adhesions with repair of serosal tear of colon;  Surgeon: Janice Givens, MD;  Location: [facility];  Service: Gynecology;  Laterality: N/A;  . TUBAL LIGATION      Family History  Problem Relation Age of Onset  . Diabetes Sister   . Diabetes Maternal Grandmother   . Diabetes Other     Social History   Socioeconomic History  . Marital status: Married    Spouse name: Not on file  . Number of children: Not on file  . Years of education: Not on file  . Highest education level: Not on file  Occupational History  . Not on file  Tobacco Use  . Smoking status: Never Smoker  . Smokeless tobacco: Never Used  Substance and Sexual Activity  . Alcohol use: Yes    Comment: occasionally  . Drug use: No  . Sexual activity: Not on file  Other Topics Concern  . Not on file  Social History Narrative  . Not on file   Social Determinants of Health   Financial Resource Strain:   . Difficulty  of Paying Living Expenses: Not on file  Food Insecurity:   . Worried About Charity fundraiser in the Last Year: Not on file  . Ran Out of Food in the Last Year: Not on file  Transportation Needs:   . Lack of Transportation (Medical): Not on file  . Lack of Transportation (Non-Medical): Not on file  Physical Activity:   . Days of Exercise per Week: Not on file  . Minutes of Exercise per Session: Not on file  Stress:   . Feeling of Stress : Not on file  Social Connections:   . Frequency of Communication with Friends and Family: Not on file  . Frequency of Social Gatherings with Friends and Family: Not on file  . Attends Religious Services: Not on file  . Active Member of Clubs or Organizations: Not on file  . Attends Archivist Meetings: Not on file  . Marital Status: Not on file  Intimate Partner Violence:   . Fear of Current or Ex-Partner: Not on file  . Emotionally Abused: Not on file  . Physically Abused: Not on file  . Sexually Abused: Not on file     PHYSICAL EXAM:  VS: BP (!) 144/94   Ht 5\' 2"  (1.575 m)   Wt 280 lb (127 kg)   LMP 08/09/2014  BMI 51.21 kg/m  Physical Exam Gen: NAD, alert, cooperative with exam, well-appearing   Aspiration/Injection Procedure Note Janice Terrell [birth year]  Procedure: Injection Indications: left knee pain   Procedure Details Consent: Risks of procedure as well as the alternatives and risks of each were explained to the (patient/caregiver).  Consent for procedure obtained. Time Out: Verified patient identification, verified procedure, site/side was marked, verified correct patient position, special equipment/implants available, medications/allergies/relevent history reviewed, required imaging and test results available.  Performed.  The area was cleaned with iodine and alcohol swabs.    The left knee superior lateral suprapatellar pouch was injected using 4 cc's of 1% lidocaine with a 21 2" needle.  The syringe was  switched and a 4 mL 22 mg /mL Monovisc was injected. Ultrasound was used. Images were obtained in  Long views showing the injection.   A sterile dressing was applied.  Patient did tolerate procedure well.       ASSESSMENT & PLAN:   Primary osteoarthritis of left knee Ongoing knee pain  - gel injection today  - counseled on HEp and supportive care - consider PT or further imaging.

## 2019-04-04 NOTE — Patient Instructions (Signed)
Good to see you Please try ice  Please continue the exercises   Please send me a message in MyChart with any questions or updates.  Please see me back in 4-6 weeks.   --Dr. Raeford Razor

## 2019-05-16 ENCOUNTER — Ambulatory Visit: Payer: Managed Care, Other (non HMO) | Admitting: Family Medicine

## 2019-08-28 ENCOUNTER — Ambulatory Visit (HOSPITAL_COMMUNITY)
Admission: EM | Admit: 2019-08-28 | Discharge: 2019-08-28 | Disposition: A | Discharge status: Home / Self Care | Payer: Self-pay | Attending: Internal Medicine | Admitting: Internal Medicine

## 2019-08-28 ENCOUNTER — Encounter (HOSPITAL_COMMUNITY): Payer: Self-pay

## 2019-08-28 ENCOUNTER — Other Ambulatory Visit: Payer: Self-pay

## 2019-08-28 DIAGNOSIS — H60592 Other noninfective acute otitis externa, left ear: Secondary | ICD-10-CM

## 2019-08-28 DIAGNOSIS — G5601 Carpal tunnel syndrome, right upper limb: Secondary | ICD-10-CM

## 2019-08-28 MED ORDER — PREDNISONE 10 MG (21) PO TBPK: ORAL_TABLET | Freq: Every day | ORAL | 0 refills | Status: DC

## 2019-08-28 MED ORDER — HYDROCORTISONE-ACETIC ACID 1-2 % OT SOLN: 3.0000 [drp] | Freq: Two times a day (BID) | OTIC | 0 refills | Status: AC

## 2019-08-28 NOTE — ED Provider Notes (Signed)
[facility]    CSN: [id number] Arrival date & time: 08/28/19  1209      History   Chief Complaint Chief Complaint  Patient presents with   Hand Pain   Otalgia    HPI Janice Terrell is a 44 y.o. female comes to the urgent care with left ear pain and swelling.  Patient has been cleaning her ears vigorously and over the past few days she has noticed increasing pain and swelling in the left ear.  No ringing in the left ear.  No discharge.  No hearing loss.  No fever or chills.  Patient has a history of carpal tunnel syndrome which has been managed with steroid injections into the wrist.  She has not had a steroid injection for a while.  Currently she is complaining of numbness and tingling of the right thumb index and middle fingers.  No weakness in the hand.  No swelling.   HPI  Past Medical History:  Diagnosis Date   Arthritis    Obesity, Class III, BMI 40-49.9 (morbid obesity) ([place]) 04/27/2011    Patient Active Problem List   Diagnosis Date Noted   Primary osteoarthritis of left knee 02/21/2019   Left ovarian cyst 08/28/2018   Irregular bleeding 11/17/2014   Obesity, Class III, BMI 40-49.9 (morbid obesity) ([place]) 04/27/2011    Past Surgical History:  Procedure Laterality Date   ABDOMINAL HYSTERECTOMY     BREATH TEK H PYLORI  05/19/2011   Procedure: BREATH TEK H PYLORI;  Surgeon: Pedro Earls, MD;  Location: [facility];  Service: General;  Laterality: N/A;   [place]  11/17/2014   Procedure: Consuela Mimes;  Surgeon: Crawford Givens, MD;  Location: [facility];  Service: Gynecology;;   LAPAROSCOPIC LYSIS OF ADHESIONS N/A 08/28/2018   Procedure: Laparoscopic Lysis Of Adhesions with repair of serosal tear of colon;  Surgeon: Crawford Givens, MD;  Location: [facility];  Service: Gynecology;  Laterality: N/A;   TUBAL LIGATION      OB History   No obstetric history on file.      Home Medications    Prior to Admission  medications   Medication Sig Start Date End Date Taking? Authorizing Provider  acetic acid-hydrocortisone (VOSOL-HC) OTIC solution Place 3 drops into the left ear 2 (two) times daily for 5 days. 08/28/19 09/02/19  Chase Picket, MD  ibuprofen (ADVIL) 800 MG tablet Take 1 tablet (800 mg total) by mouth every 8 (eight) hours as needed for mild pain. 01/24/19   Ward, Delice Bison, DO  meloxicam (MOBIC) 7.5 MG tablet Take 1 tablet (7.5 mg total) by mouth daily. 11/23/18   Wardell Honour, MD  predniSONE (STERAPRED UNI-PAK 21 TAB) 10 MG (21) TBPK tablet Take by mouth daily. Take 6 tabs by mouth daily  for 2 days, then 5 tabs for 2 days, then 4 tabs for 2 days, then 3 tabs for 2 days, 2 tabs for 2 days, then 1 tab by mouth daily for 2 days 08/28/19   Anthonyjames Bargar, Myrene Galas, MD    Family History Family History  Problem Relation Age of Onset   Diabetes Sister    Diabetes Maternal Grandmother    Diabetes Other     Social History Social History   Tobacco Use   Smoking status: Never Smoker   Smokeless tobacco: Never Used  Scientific laboratory technician Use: Never used  Substance Use Topics   Alcohol use: Yes    Comment:  occasionally   Drug use: No     Allergies   Patient has no known allergies.   Review of Systems Review of Systems  HENT: Positive for ear pain. Negative for ear discharge.   Respiratory: Negative.   Gastrointestinal: Negative.   Musculoskeletal: Positive for arthralgias. Negative for joint swelling and myalgias.  Neurological: Negative.      Physical Exam Triage Vital Signs ED Triage Vitals  Enc Vitals Group     BP 08/28/19 1352 (!) 163/83     Pulse Rate 08/28/19 1352 71     Resp 08/28/19 1352 16     Temp 08/28/19 1352 98.1 F (36.7 C)     Temp Source 08/28/19 1352 Oral     SpO2 08/28/19 1352 100 %     Weight --      Height --      Head Circumference --      Peak Flow --      Pain Score 08/28/19 1351 7     Pain Loc --      Pain Edu? --      Excl. in [place]? --      No data found.  Updated Vital Signs BP (!) 163/83    Pulse 71    Temp 98.1 F (36.7 C) (Oral)    Resp 16    LMP 08/09/2014    SpO2 100%   Visual Acuity Right Eye Distance:   Left Eye Distance:   Bilateral Distance:    Right Eye Near:   Left Eye Near:    Bilateral Near:     Physical Exam Vitals and nursing note reviewed.  HENT:     Right Ear: Tympanic membrane normal.     Left Ear: Tympanic membrane normal.     Ears:     Comments: Mild swelling of left external ear canal.  Tympanic membrane is without erythema. Cardiovascular:     Rate and Rhythm: Normal rate and regular rhythm.     Pulses: Normal pulses.     Heart sounds: Normal heart sounds.  Pulmonary:     Effort: Pulmonary effort is normal.     Breath sounds: Normal breath sounds.  Neurological:     Mental Status: She is alert.      UC Treatments / Results  Labs (all labs ordered are listed, but only abnormal results are displayed) Labs Reviewed - No data to display  EKG   Radiology No results found.  Procedures Procedures (including critical care time)  Medications Ordered in UC Medications - No data to display  Initial Impression / Assessment and Plan / UC Course  I have reviewed the triage vital signs and the nursing notes.  Pertinent labs & imaging results that were available during my care of the patient were reviewed by me and considered in my medical decision making (see chart for details).     1.  Carpal tunnel syndrome involving the right wrist: Tapering dose of prednisone If symptoms does not improve, patient was advised to follow-up with orthopedic surgery to be evaluated  2.  Acute irritant otitis externa of the left ear: Acetic acid-hydrocortisone eardrops twice daily for 5 days Return precautions given If symptoms worsen patient advised to return to the urgent care. Final Clinical Impressions(s) / UC Diagnoses   Final diagnoses:  Carpal tunnel syndrome of right wrist  Acute  irritant otitis externa of left ear   Discharge Instructions   None    ED Prescriptions    Medication Sig  Dispense Auth. Provider   predniSONE (STERAPRED UNI-PAK 21 TAB) 10 MG (21) TBPK tablet Take by mouth daily. Take 6 tabs by mouth daily  for 2 days, then 5 tabs for 2 days, then 4 tabs for 2 days, then 3 tabs for 2 days, 2 tabs for 2 days, then 1 tab by mouth daily for 2 days 42 tablet Earlin Sweeden, Myrene Galas, MD   acetic acid-hydrocortisone (VOSOL-HC) OTIC solution Place 3 drops into the left ear 2 (two) times daily for 5 days. 10 mL Orpha Dain, Myrene Galas, MD     PDMP not reviewed this encounter.   Chase Picket, MD 08/28/19 [phone]

## 2019-08-28 NOTE — ED Triage Notes (Signed)
Pt c/o left ear pain and swelling, states she believes something is stuck inside   Pt also c/o pain and tingling to right hand, hx of Carpal tunnel

## 2019-09-30 ENCOUNTER — Ambulatory Visit (INDEPENDENT_AMBULATORY_CARE_PROVIDER_SITE_OTHER): Payer: Managed Care, Other (non HMO)

## 2019-09-30 ENCOUNTER — Other Ambulatory Visit: Payer: Self-pay

## 2019-09-30 ENCOUNTER — Encounter (HOSPITAL_COMMUNITY): Payer: Self-pay | Admitting: Emergency Medicine

## 2019-09-30 ENCOUNTER — Ambulatory Visit (HOSPITAL_COMMUNITY)
Admission: EM | Admit: 2019-09-30 | Discharge: 2019-09-30 | Disposition: A | Discharge status: Home / Self Care | Payer: Managed Care, Other (non HMO) | Attending: Family Medicine | Admitting: Family Medicine

## 2019-09-30 DIAGNOSIS — U071 COVID-19: Secondary | ICD-10-CM

## 2019-09-30 DIAGNOSIS — B948 Sequelae of other specified infectious and parasitic diseases: Secondary | ICD-10-CM

## 2019-09-30 DIAGNOSIS — R05 Cough: Secondary | ICD-10-CM | POA: Diagnosis present

## 2019-09-30 DIAGNOSIS — R519 Headache, unspecified: Secondary | ICD-10-CM | POA: Diagnosis present

## 2019-09-30 DIAGNOSIS — R0602 Shortness of breath: Secondary | ICD-10-CM | POA: Diagnosis not present

## 2019-09-30 DIAGNOSIS — R030 Elevated blood-pressure reading, without diagnosis of hypertension: Secondary | ICD-10-CM

## 2019-09-30 DIAGNOSIS — R059 Cough, unspecified: Secondary | ICD-10-CM

## 2019-09-30 LAB — CBC
HCT: 40.2 % (ref 36.0–46.0)
Hemoglobin: 12.6 g/dL (ref 12.0–15.0)
MCH: 27.9 pg (ref 26.0–34.0)
MCHC: 31.3 g/dL (ref 30.0–36.0)
MCV: 88.9 fL (ref 80.0–100.0)
Platelets: 327 10*3/uL (ref 150–400)
RBC: 4.52 MIL/uL (ref 3.87–5.11)
RDW: 13.7 % (ref 11.5–15.5)
WBC: 5.2 10*3/uL (ref 4.0–10.5)
nRBC: 0 % (ref 0.0–0.2)

## 2019-09-30 LAB — BASIC METABOLIC PANEL
Anion gap: 10 (ref 5–15)
BUN: 13 mg/dL (ref 6–20)
CO2: 27 mmol/L (ref 22–32)
Calcium: 9.1 mg/dL (ref 8.9–10.3)
Chloride: 104 mmol/L (ref 98–111)
Creatinine, Ser: 0.68 mg/dL (ref 0.44–1.00)
GFR calc Af Amer: >60 mL/min (ref >60)
GFR calc non Af Amer: >60 mL/min (ref >60)
Glucose, Bld: 94 mg/dL (ref 70–99)
Potassium: 4.4 mmol/L (ref 3.5–5.1)
Sodium: 141 mmol/L (ref 135–145)

## 2019-09-30 LAB — POCT URINALYSIS DIPSTICK, ED / UC
Bilirubin Urine: NEGATIVE
Glucose, UA: NEGATIVE mg/dL
Hgb urine dipstick: NEGATIVE
Ketones, ur: NEGATIVE mg/dL
Leukocytes,Ua: NEGATIVE
Nitrite: NEGATIVE
Protein, ur: NEGATIVE mg/dL
Specific Gravity, Urine: 1.015 (ref 1.005–1.030)
Urobilinogen, UA: 0.2 mg/dL (ref 0.0–1.0)
pH: 6.5 (ref 5.0–8.0)

## 2019-09-30 MED ORDER — BENZONATATE 200 MG PO CAPS: 200.0000 mg | ORAL_CAPSULE | Freq: Two times a day (BID) | ORAL | 0 refills | Status: DC | PRN

## 2019-09-30 NOTE — ED Provider Notes (Signed)
[facility]    CSN: [id number] Arrival date & time: 09/30/19  5916      History   Chief Complaint No chief complaint on file.   HPI Janice Terrell is a 44 y.o. female.   HPI Patient is fully vaccinated against Covid.  She got these in April.  She states that she took care of her grandchild with Covid.  She was Covid positive on 09/01/2019 at [facility].  She did not receive any infusion therapy.  She states that she is here today because she has persistent headaches.  Fatigue.  Shortness of breath and cough.  Slightly better than in initially, but she is concerned that her symptoms are lasting this long.  She is worried about pneumonia.  She is worried about long-lasting effects of her Covid infection. She no longer has any sweats chills or fever. She states for the last 3 days she has had some vomiting and diarrhea.  No abdominal pain. Past Medical History:  Diagnosis Date  . Arthritis   . Obesity, Class III, BMI 40-49.9 (morbid obesity) ([place]) 04/27/2011    Patient Active Problem List   Diagnosis Date Noted  . Primary osteoarthritis of left knee 02/21/2019  . Left ovarian cyst 08/28/2018  . Irregular bleeding 11/17/2014  . Obesity, Class III, BMI 40-49.9 (morbid obesity) ([place]) 04/27/2011    Past Surgical History:  Procedure Laterality Date  . ABDOMINAL HYSTERECTOMY    . BREATH TEK H PYLORI  05/19/2011   Procedure: BREATH TEK H PYLORI;  Surgeon: Pedro Earls, MD;  Location: [facility];  Service: General;  Laterality: N/A;  . [place]  . CYSTOSCOPY  11/17/2014   Procedure: CYSTOSCOPY;  Surgeon: Crawford Givens, MD;  Location: [facility];  Service: Gynecology;;  . LAPAROSCOPIC LYSIS OF ADHESIONS N/A 08/28/2018   Procedure: Laparoscopic Lysis Of Adhesions with repair of serosal tear of colon;  Surgeon: Crawford Givens, MD;  Location: [facility];  Service: Gynecology;  Laterality: N/A;  . TUBAL LIGATION      OB History   No obstetric history on file.        Home Medications    Prior to Admission medications   Medication Sig Start Date End Date Taking? Authorizing Provider  Pseudoeph-Doxylamine-DM-APAP (NYQUIL PO) Take by mouth.   Yes [provider]  benzonatate (TESSALON) 200 MG capsule Take 1 capsule (200 mg total) by mouth 2 (two) times daily as needed for cough. 09/30/19   Raylene Everts, MD  ibuprofen (ADVIL) 800 MG tablet Take 1 tablet (800 mg total) by mouth every 8 (eight) hours as needed for mild pain. 01/24/19   Ward, Delice Bison, DO    Family History Family History  Problem Relation Age of Onset  . Diabetes Sister   . Diabetes Maternal Grandmother   . Diabetes Other     Social History Social History   Tobacco Use  . Smoking status: Never Smoker  . Smokeless tobacco: Never Used  Vaping Use  . Vaping Use: Never used  Substance Use Topics  . Alcohol use: Yes    Comment: occasionally  . Drug use: No     Allergies   Patient has no known allergies.   Review of Systems Review of Systems See HPI  Physical Exam Triage Vital Signs ED Triage Vitals  Enc Vitals Group     BP 09/30/19 1101 (!) 183/104     Pulse Rate 09/30/19 1101 63     Resp 09/30/19 1101 20  Temp 09/30/19 1101 98.7 F (37.1 C)     Temp Source 09/30/19 1101 Oral     SpO2 09/30/19 1101 99 %     Weight --      Height --      Head Circumference --      Peak Flow --      Pain Score 09/30/19 1057 7     Pain Loc --      Pain Edu? --      Excl. in [place]? --    No data found.  Updated Vital Signs BP (!) 183/104 (BP Location: Right Arm)   Pulse 63   Temp 98.7 F (37.1 C) (Oral)   Resp 20   LMP 08/09/2014   SpO2 99%     Physical Exam Constitutional:      General: She is not in acute distress.    Appearance: She is well-developed. She is obese. She is ill-appearing.  HENT:     Head: Normocephalic and atraumatic.     Right Ear: Tympanic membrane and ear canal normal.     Left Ear: Tympanic membrane and ear canal normal.      Nose: Congestion present.     Mouth/Throat:     Mouth: Mucous membranes are moist.     Pharynx: No posterior oropharyngeal erythema.  Eyes:     Conjunctiva/sclera: Conjunctivae normal.     Pupils: Pupils are equal, round, and reactive to light.  Cardiovascular:     Rate and Rhythm: Normal rate and regular rhythm.     Heart sounds: Normal heart sounds.  Pulmonary:     Effort: Pulmonary effort is normal. No respiratory distress.     Breath sounds: No wheezing or rales.     Comments: Slightly diminished breath sounds Abdominal:     General: There is no distension.     Palpations: Abdomen is soft.  Musculoskeletal:        General: Normal range of motion.     Cervical back: Normal range of motion.  Skin:    General: Skin is warm and dry.  Neurological:     General: No focal deficit present.     Mental Status: She is alert.  Psychiatric:        Mood and Affect: Mood normal.        Behavior: Behavior normal.      UC Treatments / Results  Labs (all labs ordered are listed, but only abnormal results are displayed) Labs Reviewed  CBC  BASIC METABOLIC PANEL  POCT URINALYSIS DIPSTICK, ED / UC    EKG   Radiology DG Chest 2 View  Result Date: 09/30/2019 CLINICAL DATA:  Shortness of breath. EXAM: CHEST - 2 VIEW COMPARISON:  06/03/2018 FINDINGS: The heart size and mediastinal contours are within normal limits. Both lungs are clear. The visualized skeletal structures are unremarkable. IMPRESSION: No active cardiopulmonary disease. Electronically Signed   By: Misty Stanley M.D.   On: 09/30/2019 11:54    Procedures Procedures (including critical care time)  Medications Ordered in UC Medications - No data to display  Initial Impression / Assessment and Plan / UC Course  I have reviewed the triage vital signs and the nursing notes.  Pertinent labs & imaging results that were available during my care of the patient were reviewed by me and considered in my medical decision  making (see chart for details).     I explained to the patient that it is not unusual to have persistent symptoms after Covid  infection.  It is fortunate that she had her Covid vaccinations otherwise I believe she will become very sick.  We will check blood work to make sure there is no other complications.  Chest x-ray looks good.  Continued symptomatic care Final Clinical Impressions(s) / UC Diagnoses   Final diagnoses:  Post-viral disorder  Bad headache  Cough  COVID-19 virus infection  Single episode of elevated blood pressure     Discharge Instructions     Continue to drink plenty of fluids Try Excedrin for the headaches Take Tessalon 2 or 3 times a day for cough Your chest x-ray is normal You can check for your test results on MyChart, will you will be called with the results especially if anything is positive     ED Prescriptions    Medication Sig Dispense Auth. Provider   benzonatate (TESSALON) 200 MG capsule Take 1 capsule (200 mg total) by mouth 2 (two) times daily as needed for cough. 20 capsule Raylene Everts, MD     PDMP not reviewed this encounter.   Raylene Everts, MD 09/30/19 1226

## 2019-09-30 NOTE — ED Triage Notes (Signed)
Patient tested positive for covid on 09/01/2019.  Patient continues to feel bad.  Patient has cough, stuffy nose, right sided headache, and sob  Vomiting and diarrhea for the past 3 days.

## 2019-09-30 NOTE — Discharge Instructions (Signed)
Continue to drink plenty of fluids Try Excedrin for the headaches Take Tessalon 2 or 3 times a day for cough Your chest x-ray is normal You can check for your test results on MyChart, will you will be called with the results especially if anything is positive

## 2019-12-29 ENCOUNTER — Other Ambulatory Visit: Payer: Self-pay | Admitting: Gastroenterology

## 2020-01-26 ENCOUNTER — Other Ambulatory Visit: Payer: Self-pay

## 2020-02-03 ENCOUNTER — Other Ambulatory Visit (HOSPITAL_COMMUNITY)
Admission: RE | Admit: 2020-02-03 | Discharge: 2020-02-03 | Disposition: A | Discharge status: Home / Self Care | Payer: Managed Care, Other (non HMO) | Source: Ambulatory Visit | Attending: Gastroenterology | Admitting: Gastroenterology

## 2020-02-03 DIAGNOSIS — Z20822 Contact with and (suspected) exposure to covid-19: Secondary | ICD-10-CM | POA: Diagnosis not present

## 2020-02-03 DIAGNOSIS — Z01812 Encounter for preprocedural laboratory examination: Secondary | ICD-10-CM | POA: Diagnosis present

## 2020-02-04 LAB — SARS CORONAVIRUS 2 (TAT 6-24 HRS): SARS Coronavirus 2: NEGATIVE

## 2020-02-06 ENCOUNTER — Ambulatory Visit (HOSPITAL_COMMUNITY): Payer: Managed Care, Other (non HMO) | Admitting: Anesthesiology

## 2020-02-06 ENCOUNTER — Encounter (HOSPITAL_COMMUNITY)
Admission: RE | Disposition: A | Discharge status: Home / Self Care | Payer: Self-pay | Source: Home / Self Care | Attending: Gastroenterology

## 2020-02-06 ENCOUNTER — Other Ambulatory Visit: Payer: Self-pay

## 2020-02-06 ENCOUNTER — Ambulatory Visit (HOSPITAL_COMMUNITY)
Admission: RE | Admit: 2020-02-06 | Discharge: 2020-02-06 | Disposition: A | Discharge status: Home / Self Care | Payer: Managed Care, Other (non HMO) | Attending: Gastroenterology | Admitting: Gastroenterology

## 2020-02-06 DIAGNOSIS — Z1211 Encounter for screening for malignant neoplasm of colon: Secondary | ICD-10-CM | POA: Diagnosis not present

## 2020-02-06 DIAGNOSIS — K635 Polyp of colon: Secondary | ICD-10-CM | POA: Diagnosis not present

## 2020-02-06 DIAGNOSIS — K5730 Diverticulosis of large intestine without perforation or abscess without bleeding: Secondary | ICD-10-CM | POA: Insufficient documentation

## 2020-02-06 HISTORY — PX: COLONOSCOPY WITH PROPOFOL: SHX5780

## 2020-02-06 HISTORY — PX: POLYPECTOMY: SHX5525

## 2020-02-06 SURGERY — COLONOSCOPY WITH PROPOFOL
Anesthesia: Monitor Anesthesia Care

## 2020-02-06 MED ORDER — PROPOFOL 500 MG/50ML IV EMUL
INTRAVENOUS | Status: DC | PRN
  Administered 2020-02-06: 130 ug/kg/min via INTRAVENOUS

## 2020-02-06 MED ORDER — PROPOFOL 10 MG/ML IV BOLUS
INTRAVENOUS | Status: DC | PRN
  Administered 2020-02-06: 10 mg via INTRAVENOUS
  Administered 2020-02-06 (×2): 20 mg via INTRAVENOUS

## 2020-02-06 MED ORDER — SODIUM CHLORIDE 0.9 % IV SOLN: INTRAVENOUS | Status: DC

## 2020-02-06 MED ORDER — LACTATED RINGERS IV SOLN: INTRAVENOUS | Status: DC | PRN

## 2020-02-06 SURGICAL SUPPLY — 22 items

## 2020-02-06 NOTE — H&P (Signed)
  Janice Terrell   HPI: At this time the patient denies any problems with nausea, vomiting, fevers, chills, abdominal pain, diarrhea, constipation, hematochezia, melena, GERD, or dysphagia. The patient denies any known family history of colon cancers. No complaints of chest pain, SOB, MI, or sleep apnea.    Past Medical History:  Diagnosis Date  . Arthritis   . Obesity, Class III, BMI 40-49.9 (morbid obesity) ([place]) 04/27/2011    Past Surgical History:  Procedure Laterality Date  . ABDOMINAL HYSTERECTOMY    . BREATH TEK H PYLORI  05/19/2011   Procedure: BREATH TEK H PYLORI;  Surgeon: Pedro Earls, MD;  Location: [facility];  Service: General;  Laterality: N/A;  . [place]  . CYSTOSCOPY  11/17/2014   Procedure: CYSTOSCOPY;  Surgeon: Crawford Givens, MD;  Location: [facility];  Service: Gynecology;;  . LAPAROSCOPIC LYSIS OF ADHESIONS N/A 08/28/2018   Procedure: Laparoscopic Lysis Of Adhesions with repair of serosal tear of colon;  Surgeon: Crawford Givens, MD;  Location: [facility];  Service: Gynecology;  Laterality: N/A;  . TUBAL LIGATION      Family History  Problem Relation Age of Onset  . Diabetes Sister   . Diabetes Maternal Grandmother   . Diabetes Other     Social History:  reports that she has never smoked. She has never used smokeless tobacco. She reports current alcohol use. She reports that she does not use drugs.  Allergies: No Known Allergies  Medications:  Scheduled:  Continuous: . sodium chloride      No results found for this or any previous visit (from the past 24 hour(s)).   No results found.  ROS:  As stated above in the HPI otherwise negative.  Blood pressure (!) 178/99, pulse 73, temperature 98 F (36.7 C), temperature source Oral, resp. rate (!) 23, height 5\' 2"  (1.575 m), weight 123.4 kg, last menstrual period 08/09/2014, SpO2 100 %.    PE: Gen: NAD, Alert and Oriented HEENT:  [place]/AT, EOMI Neck: Supple, no LAD Lungs: CTA  Bilaterally CV: RRR without M/G/R ABD: Soft, NTND, +BS Ext: No C/C/E  Assessment/Plan: 1) Screening colonoscopy.  Janice Terrell D 02/06/2020, 9:40 AM

## 2020-02-06 NOTE — Op Note (Addendum)
[facility] Patient Name: Janice Terrell Procedure Date: 02/06/2020 MRN: [id number] Attending MD: Carol Ada , MD Date of Birth: 01/25/1976 CSN: [id number] Age: 45 Admit Type: Outpatient Procedure:                Colonoscopy Indications:              Screening for colorectal malignant neoplasm Providers:                Carol Ada, MD, Cleda Daub, RN, William Dalton, Technician Referring MD:              Medicines:                 Complications:            No immediate complications. Estimated Blood Loss:     Estimated blood loss: none. Procedure:                Pre-Anesthesia Assessment:                           - Prior to the procedure, a History and Physical                            was performed, and patient medications and                            allergies were reviewed. The patient's tolerance of                            previous anesthesia was also reviewed. The risks                            and benefits of the procedure and the sedation                            options and risks were discussed with the patient.                            All questions were answered, and informed consent                            was obtained. Prior Anticoagulants: The patient has                            taken no previous anticoagulant or antiplatelet                            agents. ASA Grade Assessment: III - A patient with                            severe systemic disease. After reviewing the risks                            and benefits,  the patient was deemed in                            satisfactory condition to undergo the procedure.                           - Sedation was administered by an anesthesia                            professional. Deep sedation was attained.                           After obtaining informed consent, the colonoscope                            was passed under direct vision. Throughout the                             procedure, the patient's blood pressure, pulse, and                            oxygen saturations were monitored continuously. The                            CF-HQ190L ([id number] Olympus colonoscope was                            introduced through the anus and advanced to the the                            cecum, identified by appendiceal orifice and                            ileocecal valve. The colonoscopy was performed                            without difficulty. The patient tolerated the                            procedure well. The quality of the bowel                            preparation was excellent. The ileocecal valve,                            appendiceal orifice, and rectum were photographed. Scope In: 10:00:01 AM Scope Out: 10:11:16 AM Scope Withdrawal Time: 0 hours 8 minutes 2 seconds  Total Procedure Duration: 0 hours 11 minutes 15 seconds  Findings:      A 3 mm polyp was found in the descending colon. The polyp was sessile.       The polyp was removed with a cold snare. Resection and retrieval were       complete.      Scattered small-mouthed diverticula were found in the sigmoid colon. Impression:               -  One 3 mm polyp in the descending colon, removed                            with a cold snare. Resected and retrieved.                           - Diverticulosis in the sigmoid colon. Moderate Sedation:      Not Applicable - Patient had care per Anesthesia. Recommendation:           - Patient has a contact number available for                            emergencies. The signs and symptoms of potential                            delayed complications were discussed with the                            patient. Return to normal activities tomorrow.                            Written discharge instructions were provided to the                            patient.                           - Resume previous diet.                            - Continue present medications.                           - Await pathology results.                           - Repeat colonoscopy in 7 years for surveillance. Procedure Code(s):        --- Professional ---                           [phone], Colonoscopy, flexible; with removal of                            tumor(s), polyp(s), or other lesion(s) by snare                            technique Diagnosis Code(s):        --- Professional ---                           Z12.11, Encounter for screening for malignant                            neoplasm of colon  K63.5, Polyp of colon                           K57.30, Diverticulosis of large intestine without                            perforation or abscess without bleeding CPT copyright 2019 [employer]. All rights reserved. The codes documented in this report are preliminary and upon coder review may  be revised to meet current compliance requirements. Carol Ada, MD Carol Ada, MD 02/06/2020 10:16:12 AM This report has been signed electronically. Number of Addenda: 0

## 2020-02-06 NOTE — Anesthesia Preprocedure Evaluation (Signed)
Anesthesia Evaluation  Patient identified by MRN, date of birth, ID band Patient awake    Reviewed: Allergy & Precautions, NPO status , Patient's Chart, lab work & pertinent test results  Airway Mallampati: II  TM Distance: >3 FB Neck ROM: Full    Dental  (+) Dental Advisory Given, Caps   Pulmonary neg pulmonary ROS,    Pulmonary exam normal breath sounds clear to auscultation       Cardiovascular negative cardio ROS Normal cardiovascular exam Rhythm:Regular Rate:Normal     Neuro/Psych negative neurological ROS     GI/Hepatic negative GI ROS, Neg liver ROS,   Endo/Other  Morbid obesity  Renal/GU negative Renal ROS     Musculoskeletal  (+) Arthritis ,   Abdominal   Peds  Hematology negative hematology ROS (+)   Anesthesia Other Findings   Reproductive/Obstetrics                             Anesthesia Physical Anesthesia Plan  ASA: III  Anesthesia Plan: MAC   Post-op Pain Management:    Induction: Intravenous  PONV Risk Score and Plan: 2 and Propofol infusion and Treatment may vary due to age or medical condition  Airway Management Planned: Nasal Cannula and Natural Airway  Additional Equipment:   Intra-op Plan:   Post-operative Plan:   Informed Consent: I have reviewed the patients History and Physical, chart, labs and discussed the procedure including the risks, benefits and alternatives for the proposed anesthesia with the patient or authorized representative who has indicated his/her understanding and acceptance.     Dental advisory given  Plan Discussed with: CRNA and Anesthesiologist  Anesthesia Plan Comments:         Anesthesia Quick Evaluation

## 2020-02-06 NOTE — Anesthesia Postprocedure Evaluation (Signed)
Anesthesia Post Note  Patient: Janice Terrell  Procedure(s) Performed: COLONOSCOPY WITH PROPOFOL (N/A ) POLYPECTOMY     Patient location during evaluation: Endoscopy Anesthesia Type: MAC Level of consciousness: awake and alert Pain management: pain level controlled Vital Signs Assessment: post-procedure vital signs reviewed and stable Respiratory status: spontaneous breathing, nonlabored ventilation, respiratory function stable and patient connected to nasal cannula oxygen Cardiovascular status: stable and blood pressure returned to baseline Postop Assessment: no apparent nausea or vomiting Anesthetic complications: no   No complications documented.  Last Vitals:  Vitals:   02/06/20 1030 02/06/20 1040  BP: (!) 148/83 (!) 157/96  Pulse: 66 63  Resp: (!) 22 20  Temp:    SpO2: 94% 95%    Last Pain:  Vitals:   02/06/20 1040  TempSrc:   PainSc: 0-No pain                 Catalina Gravel

## 2020-02-06 NOTE — Transfer of Care (Signed)
Immediate Anesthesia Transfer of Care Note  Patient: [place]  Procedure(s) Performed: COLONOSCOPY WITH PROPOFOL (N/A ) POLYPECTOMY  Patient Location: PACU  Anesthesia Type:MAC  Level of Consciousness: sedated  Airway & Oxygen Therapy: Patient Spontanous Breathing and Patient connected to face mask oxygen  Post-op Assessment: Report given to RN and Post -op Vital signs reviewed and stable   Post vital signs: Reviewed and stable  Last Vitals:  Vitals Value Taken Time  BP 128/69 02/06/20 1017  Temp    Pulse 63 02/06/20 1018  Resp 19 02/06/20 1018  SpO2 96 % 02/06/20 1018  Vitals shown include unvalidated device data.  Last Pain:  Vitals:   02/06/20 0911  TempSrc: Oral  PainSc: 0-No pain         Complications: No complications documented.

## 2020-02-06 NOTE — Discharge Instructions (Signed)

## 2020-02-08 ENCOUNTER — Encounter (HOSPITAL_COMMUNITY): Payer: Self-pay | Admitting: Gastroenterology

## 2020-02-09 LAB — SURGICAL PATHOLOGY

## 2020-02-09 NOTE — Progress Notes (Signed)
No answer, left message on voicemail.

## 2020-06-09 ENCOUNTER — Emergency Department (HOSPITAL_COMMUNITY): Payer: Managed Care, Other (non HMO)

## 2020-06-09 ENCOUNTER — Emergency Department (HOSPITAL_COMMUNITY)
Admission: EM | Admit: 2020-06-09 | Discharge: 2020-06-09 | Disposition: A | Discharge status: Home / Self Care | Payer: Managed Care, Other (non HMO) | Attending: Emergency Medicine | Admitting: Emergency Medicine

## 2020-06-09 ENCOUNTER — Encounter (HOSPITAL_COMMUNITY): Payer: Self-pay | Admitting: Emergency Medicine

## 2020-06-09 DIAGNOSIS — R079 Chest pain, unspecified: Secondary | ICD-10-CM

## 2020-06-09 DIAGNOSIS — R519 Headache, unspecified: Secondary | ICD-10-CM

## 2020-06-09 DIAGNOSIS — Z20822 Contact with and (suspected) exposure to covid-19: Secondary | ICD-10-CM | POA: Insufficient documentation

## 2020-06-09 DIAGNOSIS — I10 Essential (primary) hypertension: Secondary | ICD-10-CM | POA: Diagnosis not present

## 2020-06-09 LAB — RESP PANEL BY RT-PCR (FLU A&B, COVID) ARPGX2
Influenza A by PCR: NEGATIVE
Influenza B by PCR: NEGATIVE
SARS Coronavirus 2 by RT PCR: NEGATIVE

## 2020-06-09 LAB — BASIC METABOLIC PANEL
Anion gap: 6 (ref 5–15)
BUN: 10 mg/dL (ref 6–20)
CO2: 24 mmol/L (ref 22–32)
Calcium: 8.6 mg/dL — ABNORMAL LOW (ref 8.9–10.3)
Chloride: 108 mmol/L (ref 98–111)
Creatinine, Ser: 0.56 mg/dL (ref 0.44–1.00)
GFR, Estimated: >60 mL/min (ref >60)
Glucose, Bld: 105 mg/dL — ABNORMAL HIGH (ref 70–99)
Potassium: 4.2 mmol/L (ref 3.5–5.1)
Sodium: 138 mmol/L (ref 135–145)

## 2020-06-09 LAB — TROPONIN I (HIGH SENSITIVITY)
Troponin I (High Sensitivity): 6 ng/L (ref <18)
Troponin I (High Sensitivity): 6 ng/L (ref <18)

## 2020-06-09 LAB — I-STAT BETA HCG BLOOD, ED (MC, WL, AP ONLY): I-stat hCG, quantitative: <5 m[IU]/mL (ref <5)

## 2020-06-09 LAB — CBC
HCT: 38.9 % (ref 36.0–46.0)
Hemoglobin: 12.4 g/dL (ref 12.0–15.0)
MCH: 28.9 pg (ref 26.0–34.0)
MCHC: 31.9 g/dL (ref 30.0–36.0)
MCV: 90.7 fL (ref 80.0–100.0)
Platelets: 316 10*3/uL (ref 150–400)
RBC: 4.29 MIL/uL (ref 3.87–5.11)
RDW: 13.5 % (ref 11.5–15.5)
WBC: 4.1 10*3/uL (ref 4.0–10.5)
nRBC: 0 % (ref 0.0–0.2)

## 2020-06-09 MED ORDER — KETOROLAC TROMETHAMINE 30 MG/ML IJ SOLN
30.0000 mg | Freq: Once | INTRAMUSCULAR | Status: AC
Start: 2020-06-09 — End: 2020-06-09
  Administered 2020-06-09: 30 mg via INTRAVENOUS
  Filled 2020-06-09: qty 1

## 2020-06-09 MED ORDER — AMLODIPINE BESYLATE 5 MG PO TABS: 5.0000 mg | ORAL_TABLET | Freq: Every day | ORAL | 0 refills | Status: DC

## 2020-06-09 NOTE — ED Triage Notes (Signed)
Patient BIB [facility] from home with complaint of headache with blurred vision that started two days ago, then states last night noticed some chest tightness with shortness of breath. Denies history of migraines or caridac issue. Patient alert, oriented, and in no apparent distress at this time.  Given 324 asa, 2x SL NTG PTA. 20g saline lock in left left hand. Patient states NTG improved chest pain from 7/10 to 6/10. BP 138/76.

## 2020-06-09 NOTE — ED Provider Notes (Signed)
[facility] EMERGENCY DEPARTMENT Provider Note   CSN: [id number] Arrival date & time: 06/09/20  1914     History Chief Complaint  Patient presents with  . Headache    Janice Terrell is a 45 y.o. female.  The history is provided by the patient.  Headache Pain location:  Frontal Quality: throbbing. Radiates to:  Does not radiate Pain severity now: moderate. Pain scale at highest: moderate. Onset quality:  Gradual Duration:  3 days Timing:  Constant Progression:  Waxing and waning Chronicity:  New Context comment:  No known cause Relieved by:  Nothing Worsened by:  Neck movement Ineffective treatments:  Resting in a darkened room Associated symptoms: blurred vision (intermittent) and nausea   Associated symptoms: no abdominal pain, no back pain, no congestion, no cough, no diarrhea, no ear pain, no eye pain, no fever, no focal weakness, no loss of balance, no neck stiffness, no numbness, no paresthesias, no photophobia, no seizures, no sore throat, no URI, no vomiting and no weakness   Chest Pain Pain location:  L chest Pain quality: throbbing   Pain radiates to:  Does not radiate Pain severity:  Moderate Onset quality:  Gradual Duration:  5 hours Timing:  Constant Progression:  Unchanged Chronicity:  New Context: at rest   Relieved by:  Nothing Worsened by:  Nothing Ineffective treatments:  Nitroglycerin (slight decrease in pain) Associated symptoms: headache and nausea   Associated symptoms: no abdominal pain, no back pain, no cough, no fever, no numbness, no palpitations, no shortness of breath, no vomiting and no weakness        Past Medical History:  Diagnosis Date  . Arthritis   . Obesity, Class III, BMI 40-49.9 (morbid obesity) ([place]) 04/27/2011    Patient Active Problem List   Diagnosis Date Noted  . Primary osteoarthritis of left knee 02/21/2019  . Left ovarian cyst 08/28/2018  . Irregular bleeding 11/17/2014  . Obesity,  Class III, BMI 40-49.9 (morbid obesity) ([place]) 04/27/2011    Past Surgical History:  Procedure Laterality Date  . ABDOMINAL HYSTERECTOMY    . BREATH TEK H PYLORI  05/19/2011   Procedure: BREATH TEK H PYLORI;  Surgeon: Pedro Earls, MD;  Location: [facility];  Service: General;  Laterality: N/A;  . [place]  . COLONOSCOPY WITH PROPOFOL N/A 02/06/2020   Procedure: COLONOSCOPY WITH PROPOFOL;  Surgeon: Carol Ada, MD;  Location: [facility];  Service: Endoscopy;  Laterality: N/A;  . CYSTOSCOPY  11/17/2014   Procedure: CYSTOSCOPY;  Surgeon: Crawford Givens, MD;  Location: [facility];  Service: Gynecology;;  . LAPAROSCOPIC LYSIS OF ADHESIONS N/A 08/28/2018   Procedure: Laparoscopic Lysis Of Adhesions with repair of serosal tear of colon;  Surgeon: Crawford Givens, MD;  Location: [place];  Service: Gynecology;  Laterality: N/A;  . POLYPECTOMY  02/06/2020   Procedure: POLYPECTOMY;  Surgeon: Carol Ada, MD;  Location: [facility];  Service: Endoscopy;;  . TUBAL LIGATION       OB History   No obstetric history on file.     Family History  Problem Relation Age of Onset  . Diabetes Sister   . Diabetes Maternal Grandmother   . Diabetes Other     Social History   Tobacco Use  . Smoking status: Never Smoker  . Smokeless tobacco: Never Used  Vaping Use  . Vaping Use: Never used  Substance Use Topics  . Alcohol use: Yes    Comment: occasionally  . Drug use: No  Home Medications Prior to Admission medications   Medication Sig Start Date End Date Taking? Authorizing Provider  acetaminophen (TYLENOL) 500 MG tablet Take 1,000 mg by mouth every 6 (six) hours as needed for headache or moderate pain.    [provider]  Ascorbic Acid (VITAMIN C) 1000 MG tablet Take 1,000 mg by mouth daily.    [provider]    Allergies    Patient has no known allergies.  Review of Systems   Review of Systems  Constitutional: Negative for chills and fever.  HENT:  Negative for congestion, ear pain and sore throat.   Eyes: Positive for blurred vision (intermittent). Negative for photophobia, pain and visual disturbance.  Respiratory: Negative for cough and shortness of breath.   Cardiovascular: Positive for chest pain. Negative for palpitations.  Gastrointestinal: Positive for nausea. Negative for abdominal pain, diarrhea and vomiting.  Genitourinary: Negative for dysuria and hematuria.  Musculoskeletal: Negative for arthralgias, back pain and neck stiffness.  Skin: Negative for color change and rash.  Neurological: Positive for headaches. Negative for focal weakness, seizures, syncope, weakness, numbness, paresthesias and loss of balance.  All other systems reviewed and are negative.   Physical Exam Updated Vital Signs BP (!) 157/101 (BP Location: Right Arm)   Pulse 65   Temp 99.7 F (37.6 C) (Oral)   Resp 12   LMP 08/09/2014   SpO2 100%   Physical Exam Vitals and nursing note reviewed.  Constitutional:      General: She is not in acute distress.    Appearance: She is well-developed.  HENT:     Head: Normocephalic and atraumatic.  Eyes:     Conjunctiva/sclera: Conjunctivae normal.  Cardiovascular:     Rate and Rhythm: Normal rate and regular rhythm.     Heart sounds: No murmur heard.   Pulmonary:     Effort: Pulmonary effort is normal. No respiratory distress.     Breath sounds: Normal breath sounds.  Abdominal:     Palpations: Abdomen is soft.     Tenderness: There is no abdominal tenderness.  Musculoskeletal:     Cervical back: Neck supple.  Skin:    General: Skin is warm and dry.  Neurological:     Mental Status: She is alert and oriented to person, place, and time.     Cranial Nerves: No cranial nerve deficit.     Motor: No weakness.  Psychiatric:        Mood and Affect: Mood normal.     ED Results / Procedures / Treatments   Labs (all labs ordered are listed, but only abnormal results are displayed) Labs Reviewed   BASIC METABOLIC PANEL - Abnormal; Notable for the following components:      Result Value   Glucose, Bld 105 (*)    Calcium 8.6 (*)    All other components within normal limits  RESP PANEL BY RT-PCR (FLU A&B, COVID) ARPGX2  CBC  I-STAT BETA HCG BLOOD, ED ([facility], [facility], [facility] ONLY)  TROPONIN I (HIGH SENSITIVITY)  TROPONIN I (HIGH SENSITIVITY)    EKG EKG Interpretation  Date/Time:  Wednesday Jun 09 2020 08:40:52 EDT Ventricular Rate:  61 PR Interval:  178 QRS Duration: 89 QT Interval:  414 QTC Calculation: 417 R Axis:   36 Text Interpretation: Sinus rhythm Baseline wander in lead(s) V6 normal axis no acute ischemia Confirmed by Lorre Munroe ([id number]) on 06/09/2020 8:53:46 AM   Radiology DG Chest 2 View  Result Date: 06/09/2020 CLINICAL DATA:  Chest pain EXAM:  CHEST - 2 VIEW COMPARISON:  09/30/2019 FINDINGS: Heart and mediastinal contours are within normal limits. No focal opacities or effusions. No acute bony abnormality. IMPRESSION: No active cardiopulmonary disease. Electronically Signed   By: Rolm Baptise M.D.   On: 06/09/2020 09:16    Procedures Procedures   Medications Ordered in ED Medications  ketorolac (TORADOL) 30 MG/ML injection 30 mg (30 mg Intravenous Given 06/09/20 0350)    ED Course  I have reviewed the triage vital signs and the nursing notes.  Pertinent labs & imaging results that were available during my care of the patient were reviewed by me and considered in my medical decision making (see chart for details).    MDM Rules/Calculators/A&P                          Di Kindle presented with headache, chest pain, and hypertension.  Her headache had benign features without red flag findings.  No neurologic deficits.  I did consider hypertensive emergency, but this seemed less likely than alternative pathology.  I also considered whether or not her symptoms were from an infectious disease such as COVID-19.  Her viral test was negative.  She was evaluated for  ACS, and this was also reassuring.  Blood pressure trended down during her ED stay but was not normal.  She does have a PCP appointment, but it is not until July.  Therefore, she has agreed to start a low-dose blood pressure medication.  She was given information on potential side effects.  Return precautions were discussed. Final Clinical Impression(s) / ED Diagnoses Final diagnoses:  Nonintractable headache, unspecified chronicity pattern, unspecified headache type  Chest pain, unspecified type  Primary hypertension    Rx / DC Orders ED Discharge Orders         Ordered    amLODipine (NORVASC) 5 MG tablet  Daily        06/09/20 1355           Arnaldo Natal, MD 06/09/20 1356

## 2020-07-03 ENCOUNTER — Encounter: Payer: Self-pay | Admitting: Emergency Medicine

## 2020-07-03 ENCOUNTER — Other Ambulatory Visit: Payer: Self-pay

## 2020-07-03 ENCOUNTER — Ambulatory Visit
Admission: EM | Admit: 2020-07-03 | Discharge: 2020-07-03 | Disposition: A | Discharge status: Home / Self Care | Payer: Managed Care, Other (non HMO) | Attending: Urgent Care | Admitting: Urgent Care

## 2020-07-03 DIAGNOSIS — M25511 Pain in right shoulder: Secondary | ICD-10-CM | POA: Diagnosis not present

## 2020-07-03 DIAGNOSIS — H9203 Otalgia, bilateral: Secondary | ICD-10-CM

## 2020-07-03 DIAGNOSIS — Z1152 Encounter for screening for COVID-19: Secondary | ICD-10-CM

## 2020-07-03 DIAGNOSIS — J069 Acute upper respiratory infection, unspecified: Secondary | ICD-10-CM

## 2020-07-03 MED ORDER — TIZANIDINE HCL 4 MG PO TABS: 4.0000 mg | ORAL_TABLET | Freq: Every evening | ORAL | 0 refills | Status: DC | PRN

## 2020-07-03 MED ORDER — PROMETHAZINE-DM 6.25-15 MG/5ML PO SYRP: 5.0000 mL | ORAL_SOLUTION | Freq: Every evening | ORAL | 0 refills | Status: DC | PRN

## 2020-07-03 MED ORDER — NAPROXEN 375 MG PO TABS: 375.0000 mg | ORAL_TABLET | Freq: Two times a day (BID) | ORAL | 0 refills | Status: DC

## 2020-07-03 MED ORDER — BENZONATATE 100 MG PO CAPS: 100.0000 mg | ORAL_CAPSULE | Freq: Three times a day (TID) | ORAL | 0 refills | Status: DC | PRN

## 2020-07-03 MED ORDER — CETIRIZINE HCL 10 MG PO TABS: 10.0000 mg | ORAL_TABLET | Freq: Every day | ORAL | 0 refills | Status: DC

## 2020-07-03 MED ORDER — FLUTICASONE PROPIONATE 50 MCG/ACT NA SUSP: 2.0000 | Freq: Every day | NASAL | 0 refills | Status: DC

## 2020-07-03 NOTE — Discharge Instructions (Signed)
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
## 2020-07-04 LAB — NOVEL CORONAVIRUS, NAA: SARS-CoV-2, NAA: DETECTED — AB

## 2020-07-04 LAB — SARS-COV-2, NAA 2 DAY TAT

## 2020-07-08 ENCOUNTER — Emergency Department (HOSPITAL_COMMUNITY): Payer: Managed Care, Other (non HMO)

## 2020-07-08 ENCOUNTER — Encounter (HOSPITAL_COMMUNITY): Payer: Self-pay

## 2020-07-08 ENCOUNTER — Emergency Department (HOSPITAL_COMMUNITY)
Admission: EM | Admit: 2020-07-08 | Discharge: 2020-07-09 | Disposition: A | Discharge status: Home / Self Care | Payer: Managed Care, Other (non HMO) | Attending: Emergency Medicine | Admitting: Emergency Medicine

## 2020-07-08 DIAGNOSIS — R202 Paresthesia of skin: Secondary | ICD-10-CM | POA: Insufficient documentation

## 2020-07-08 DIAGNOSIS — Y92410 Unspecified street and highway as the place of occurrence of the external cause: Secondary | ICD-10-CM | POA: Insufficient documentation

## 2020-07-08 DIAGNOSIS — T148XXA Other injury of unspecified body region, initial encounter: Secondary | ICD-10-CM

## 2020-07-08 DIAGNOSIS — R42 Dizziness and giddiness: Secondary | ICD-10-CM | POA: Diagnosis not present

## 2020-07-08 DIAGNOSIS — S39012A Strain of muscle, fascia and tendon of lower back, initial encounter: Secondary | ICD-10-CM | POA: Diagnosis not present

## 2020-07-08 DIAGNOSIS — M542 Cervicalgia: Secondary | ICD-10-CM | POA: Insufficient documentation

## 2020-07-08 DIAGNOSIS — S3992XA Unspecified injury of lower back, initial encounter: Secondary | ICD-10-CM | POA: Diagnosis present

## 2020-07-08 LAB — CBC WITH DIFFERENTIAL/PLATELET
Abs Immature Granulocytes: 0.03 10*3/uL (ref 0.00–0.07)
Basophils Absolute: 0 10*3/uL (ref 0.0–0.1)
Basophils Relative: 0 %
Eosinophils Absolute: 0.1 10*3/uL (ref 0.0–0.5)
Eosinophils Relative: 1 %
HCT: 39.8 % (ref 36.0–46.0)
Hemoglobin: 12.8 g/dL (ref 12.0–15.0)
Immature Granulocytes: 1 %
Lymphocytes Relative: 27 %
Lymphs Abs: 1.7 10*3/uL (ref 0.7–4.0)
MCH: 29 pg (ref 26.0–34.0)
MCHC: 32.2 g/dL (ref 30.0–36.0)
MCV: 90.2 fL (ref 80.0–100.0)
Monocytes Absolute: 0.6 10*3/uL (ref 0.1–1.0)
Monocytes Relative: 10 %
Neutro Abs: 4 10*3/uL (ref 1.7–7.7)
Neutrophils Relative %: 61 %
Platelets: 345 10*3/uL (ref 150–400)
RBC: 4.41 MIL/uL (ref 3.87–5.11)
RDW: 13.2 % (ref 11.5–15.5)
WBC: 6.5 10*3/uL (ref 4.0–10.5)
nRBC: 0 % (ref 0.0–0.2)

## 2020-07-08 LAB — COMPREHENSIVE METABOLIC PANEL
ALT: 28 U/L (ref 0–44)
AST: 27 U/L (ref 15–41)
Albumin: 3.8 g/dL (ref 3.5–5.0)
Alkaline Phosphatase: 77 U/L (ref 38–126)
Anion gap: 9 (ref 5–15)
BUN: 14 mg/dL (ref 6–20)
CO2: 24 mmol/L (ref 22–32)
Calcium: 9.2 mg/dL (ref 8.9–10.3)
Chloride: 105 mmol/L (ref 98–111)
Creatinine, Ser: 0.76 mg/dL (ref 0.44–1.00)
GFR, Estimated: >60 mL/min (ref >60)
Glucose, Bld: 119 mg/dL — ABNORMAL HIGH (ref 70–99)
Potassium: 3.7 mmol/L (ref 3.5–5.1)
Sodium: 138 mmol/L (ref 135–145)
Total Bilirubin: 0.3 mg/dL (ref 0.3–1.2)
Total Protein: 7.3 g/dL (ref 6.5–8.1)

## 2020-07-08 LAB — TROPONIN I (HIGH SENSITIVITY)
Troponin I (High Sensitivity): 11 ng/L (ref <18)
Troponin I (High Sensitivity): 11 ng/L (ref <18)
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
## 2020-07-08 NOTE — Discharge Instructions (Addendum)
You may use over-the-counter Motrin (Ibuprofen), Acetaminophen (Tylenol), topical muscle creams such as SalonPas, Icy Hot, Bengay, etc. Please stretch, apply ice or heat (whichever helps), and have massage therapy for additional assistance.  
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
## 2020-07-08 NOTE — ED Notes (Addendum)
Patient diagnosed with Covid 07/03/20

## 2020-07-11 ENCOUNTER — Encounter (HOSPITAL_COMMUNITY): Payer: Self-pay

## 2020-07-11 ENCOUNTER — Emergency Department (HOSPITAL_COMMUNITY): Payer: Managed Care, Other (non HMO)

## 2020-07-11 ENCOUNTER — Emergency Department (HOSPITAL_COMMUNITY)
Admission: EM | Admit: 2020-07-11 | Discharge: 2020-07-12 | Disposition: A | Discharge status: Home / Self Care | Payer: Managed Care, Other (non HMO) | Attending: Emergency Medicine | Admitting: Emergency Medicine

## 2020-07-11 DIAGNOSIS — W108XXA Fall (on) (from) other stairs and steps, initial encounter: Secondary | ICD-10-CM | POA: Insufficient documentation

## 2020-07-11 DIAGNOSIS — S52125A Nondisplaced fracture of head of left radius, initial encounter for closed fracture: Secondary | ICD-10-CM | POA: Diagnosis not present

## 2020-07-11 DIAGNOSIS — Y9301 Activity, walking, marching and hiking: Secondary | ICD-10-CM | POA: Diagnosis not present

## 2020-07-11 DIAGNOSIS — M79643 Pain in unspecified hand: Secondary | ICD-10-CM

## 2020-07-11 DIAGNOSIS — S0003XA Contusion of scalp, initial encounter: Secondary | ICD-10-CM | POA: Diagnosis not present

## 2020-07-11 DIAGNOSIS — S6992XA Unspecified injury of left wrist, hand and finger(s), initial encounter: Secondary | ICD-10-CM | POA: Diagnosis present

## 2020-07-11 DIAGNOSIS — W19XXXA Unspecified fall, initial encounter: Secondary | ICD-10-CM

## 2020-07-11 NOTE — ED Triage Notes (Signed)
Pt arrives POV for eval of L sided arm pain s/p falling on it today. Pt reports pain to L upper arm and shoulder pain.

## 2020-07-12 ENCOUNTER — Emergency Department (HOSPITAL_COMMUNITY): Payer: Managed Care, Other (non HMO)

## 2020-07-12 MED ORDER — HYDROCODONE-ACETAMINOPHEN 5-325 MG PO TABS
1.0000 | ORAL_TABLET | Freq: Once | ORAL | Status: AC
  Administered 2020-07-12: 1 via ORAL
  Filled 2020-07-12: qty 1

## 2020-07-12 MED ORDER — OXYCODONE-ACETAMINOPHEN 5-325 MG PO TABS: 1.0000 | ORAL_TABLET | Freq: Three times a day (TID) | ORAL | 0 refills | Status: DC | PRN

## 2020-07-12 NOTE — ED Notes (Signed)
Ortho tech made aware of splints needed.

## 2020-07-12 NOTE — ED Notes (Signed)
Patient called x2 for vitals with no response and not visible in lobby

## 2020-07-12 NOTE — ED Provider Notes (Signed)
[facility] EMERGENCY DEPARTMENT Provider Note   CSN: [id number] Arrival date & time: 07/11/20  2034     History Chief Complaint  Patient presents with   Arm Pain    Janice Terrell is a 45 y.o. female.  HPI Patient is a 45 year old female with past medical history significant for ovarian cyst and obesity as well as arthritis she is presenting today after a fall that occurred earlier today.  She states that the fall was mechanical.  She states that she slipped and fell while walking downstairs.  She states that she had slippery socks on and her feet slid off underneath her which caused her to fall forward onto her abdomen and just as well as her elbow.  She states that she loaded her elbow with her weight.  States that she also had caught her self somehow on her left wrist.  She states she has left wrist.,  Left elbow pain and left upper arm pain that is achy constant 9/10 worse with touch and movement.  She received warm tablet of Norco in the waiting room and states that her symptoms are somewhat improved.  She states pain is worse with touch and movement.  She endorses some swelling.  No numbness or weakness.  No other significant associated symptoms.  She states she did bump her head but denies any headaches and has been over 3 hours since she struck her head.  She states that she has had no nausea or vomiting vision changes or lightheadedness or dizziness.  No other associate symptoms.     Past Medical History:  Diagnosis Date   Arthritis    Obesity, Class III, BMI 40-49.9 (morbid obesity) ([place]) 04/27/2011    Patient Active Problem List   Diagnosis Date Noted   Primary osteoarthritis of left knee 02/21/2019   Left ovarian cyst 08/28/2018   Irregular bleeding 11/17/2014   Obesity, Class III, BMI 40-49.9 (morbid obesity) ([place]) 04/27/2011    Past Surgical History:  Procedure Laterality Date   ABDOMINAL HYSTERECTOMY     BREATH TEK H PYLORI  05/19/2011    Procedure: BREATH TEK H PYLORI;  Surgeon: Pedro Earls, MD;  Location: [facility];  Service: General;  Laterality: N/A;   [place]   COLONOSCOPY WITH PROPOFOL N/A 02/06/2020   Procedure: COLONOSCOPY WITH PROPOFOL;  Surgeon: Carol Ada, MD;  Location: [facility];  Service: Endoscopy;  Laterality: N/A;   CYSTOSCOPY  11/17/2014   Procedure: CYSTOSCOPY;  Surgeon: Crawford Givens, MD;  Location: [facility];  Service: Gynecology;;   LAPAROSCOPIC LYSIS OF ADHESIONS N/A 08/28/2018   Procedure: Laparoscopic Lysis Of Adhesions with repair of serosal tear of colon;  Surgeon: Crawford Givens, MD;  Location: [facility];  Service: Gynecology;  Laterality: N/A;   POLYPECTOMY  02/06/2020   Procedure: POLYPECTOMY;  Surgeon: Carol Ada, MD;  Location: [facility];  Service: Endoscopy;;   TUBAL LIGATION       OB History   No obstetric history on file.     Family History  Problem Relation Age of Onset   Diabetes Sister    Diabetes Maternal Grandmother    Diabetes Other     Social History   Tobacco Use   Smoking status: Never   Smokeless tobacco: Never  Vaping Use   Vaping Use: Never used  Substance Use Topics   Alcohol use: Yes    Comment: occasionally   Drug use: No    Home Medications Prior to Admission medications  Medication Sig Start Date End Date Taking? Authorizing Provider  oxyCODONE-acetaminophen (PERCOCET/ROXICET) 5-325 MG tablet Take 1 tablet by mouth every 8 (eight) hours as needed for severe pain. 07/12/20  Yes Vansh Reckart, Ova [place] S, PA  acetaminophen (TYLENOL) 500 MG tablet Take 1,000 mg by mouth every 6 (six) hours as needed for headache or moderate pain.    [provider]  amLODipine (NORVASC) 5 MG tablet Take 1 tablet (5 mg total) by mouth daily. 06/09/20   Arnaldo Natal, MD  Ascorbic Acid (VITAMIN C) 1000 MG tablet Take 1,000 mg by mouth daily.    [provider]  benzonatate (TESSALON) 100 MG capsule Take 1-2 capsules (100-200 mg total) by mouth 3  (three) times daily as needed. 07/03/20   Jaynee Eagles, PA-C  cetirizine (ZYRTEC ALLERGY) 10 MG tablet Take 1 tablet (10 mg total) by mouth daily. 07/03/20   Jaynee Eagles, PA-C  fluticasone (FLONASE) 50 MCG/ACT nasal spray Place 2 sprays into both nostrils daily. 07/03/20   Jaynee Eagles, PA-C  naproxen (NAPROSYN) 375 MG tablet Take 1 tablet (375 mg total) by mouth 2 (two) times daily with a meal. 07/03/20   Jaynee Eagles, PA-C  promethazine-dextromethorphan (PROMETHAZINE-DM) 6.25-15 MG/5ML syrup Take 5 mLs by mouth at bedtime as needed for cough. 07/03/20   Jaynee Eagles, PA-C  tiZANidine (ZANAFLEX) 4 MG tablet Take 1 tablet (4 mg total) by mouth at bedtime as needed for muscle spasms. 07/03/20   Jaynee Eagles, PA-C    Allergies    Patient has no known allergies.  Review of Systems   Review of Systems  Constitutional:  Negative for fever.  HENT:  Negative for congestion.   Respiratory:  Negative for shortness of breath.   Cardiovascular:  Negative for chest pain.  Gastrointestinal:  Negative for abdominal distention.  Musculoskeletal:        L elbow, L wrist, left arm pain, forehead pain  Neurological:  Negative for dizziness and headaches.   Physical Exam Updated Vital Signs BP (!) 155/89 (BP Location: Right Arm)   Pulse 78   Temp 97.8 F (36.6 C) (Oral)   Resp 20   Ht 5\' 2"  (1.575 m)   Wt 128 kg   LMP 08/09/2014   SpO2 100%   BMI 51.61 kg/m   Physical Exam Vitals and nursing note reviewed.  Constitutional:      General: She is not in acute distress.    Appearance: Normal appearance. She is not ill-appearing.  HENT:     Head: Normocephalic and atraumatic.  Eyes:     General: No scleral icterus.       Right eye: No discharge.        Left eye: No discharge.     Conjunctiva/sclera: Conjunctivae normal.  Pulmonary:     Effort: Pulmonary effort is normal.     Breath sounds: No stridor.  Musculoskeletal:     Comments: Tenderness palpation of the left elbow at the olecranon as well as the  lateral malleolus.  There is also tenderness to palpation with no focal bony tenderness of the upper arm.  This seems primarily to be muscular tenderness is over the bicep area.  There is also some tenderness to palpation in the anatomical snuffbox elbows does not seem to be the most focal area of tenderness.  Right arm exam without any tenderness to palpation.  No hip, C, T, L-spine or other lower extremity tenderness to palpation full range motion of bilateral lower extremities.  Skin:  General: Skin is warm and dry.  Neurological:     Mental Status: She is alert and oriented to person, place, and time. Mental status is at baseline.    ED Results / Procedures / Treatments   Labs (all labs ordered are listed, but only abnormal results are displayed) Labs Reviewed - No data to display  EKG None  Radiology DG Elbow 2 Views Left  Result Date: 07/12/2020 CLINICAL DATA:  Left arm pain post fall EXAM: LEFT ELBOW - 2 VIEW COMPARISON:  None. FINDINGS: Fracture of the radial head extending with impaction of the radial head neck junction. Conspicuous lucency within the tip of the coronoid process as well may reflect an additional nondisplaced fracture. No other acute fracture or traumatic osseous injury is identified. Elbow joint effusion with elevation of the anterior and posterior fat pads. Soft tissue swelling is seen posteriorly. IMPRESSION: Impacted fracture of the radial head neck junction with extension into the radial head. Conspicuous lucency through the coronoid process may reflect additional nondisplaced fracture. Elbow joint effusion. Posterior soft tissue swelling. Electronically Signed   By: Lovena Le M.D.   On: 07/12/2020 05:53   DG Wrist Complete Left  Result Date: 07/12/2020 CLINICAL DATA:  Left arm pain post fall EXAM: LEFT WRIST - COMPLETE 3+ VIEW COMPARISON:  None. FINDINGS: Conspicuous lucency through the scaphoid waist may reflect a nondisplaced fracture. No other acute or  conspicuous osseous injuries are evident at the wrist. No traumatic malalignment. No significant age advanced arthrosis or evidence of an underlying arthropathy. No worrisome bone lesions. No significant soft tissue swelling, gas or foreign body. IMPRESSION: Conspicuous linear lucency through the scaphoid waist could reflect a nondisplaced fracture, correlate for tenderness at the anatomic snuffbox. Electronically Signed   By: Lovena Le M.D.   On: 07/12/2020 05:50   DG Wrist Complete Right  Result Date: 07/12/2020 CLINICAL DATA:  Fall EXAM: RIGHT WRIST - COMPLETE 3+ VIEW COMPARISON:  None. FINDINGS: There is no evidence of fracture or dislocation. There is no evidence of arthropathy or other focal bone abnormality. Soft tissues are unremarkable. IMPRESSION: Negative. Electronically Signed   By: Lovena Le M.D.   On: 07/12/2020 05:51   DG Shoulder Left  Result Date: 07/11/2020 CLINICAL DATA:  Fall down stairs, pain EXAM: LEFT SHOULDER - 2+ VIEW COMPARISON:  None. FINDINGS: There is no evidence of fracture or dislocation. There is no evidence of arthropathy or other focal bone abnormality. Soft tissues are unremarkable. IMPRESSION: Negative. Electronically Signed   By: Rolm Baptise M.D.   On: 07/11/2020 22:17   DG Humerus Left  Result Date: 07/11/2020 CLINICAL DATA:  Fall down stairs.  Pain EXAM: LEFT HUMERUS - 2+ VIEW COMPARISON:  None. FINDINGS: There is no evidence of fracture or other focal bone lesions. Soft tissues are unremarkable. IMPRESSION: Negative. Electronically Signed   By: Rolm Baptise M.D.   On: 07/11/2020 22:17    Procedures Procedures   Medications Ordered in ED Medications  HYDROcodone-acetaminophen (NORCO/VICODIN) 5-325 MG per tablet 1 tablet (1 tablet Oral Given 07/12/20 3893)    ED Course  I have reviewed the triage vital signs and the nursing notes.  Pertinent labs & imaging results that were available during my care of the patient were reviewed by me and considered  in my medical decision making (see chart for details).    MDM Rules/Calculators/A&P  Patient is 45 year old female here today after fall.  This was a somewhat abnormal [place] injury she landed primarily on her left elbow.  There is a compacted radial head fracture as well as a possible olecranon fracture noted on x-ray.  I reviewed this x-ray and agree with radiology read.  Also given her tenderness in the anatomical snuffbox will place patient in sugar-tong splint after discussion with orthopedic surgery PA to confirm this is a reasonable plan.  Patient placed in sling as well.  Xrays reviewed personally.   She is distally neurovascularly intact after splint placement.  She understands return precautions and understands instructions on close follow-up with orthopedics.  She will call tomorrow to make an appointment.  She will wear the splint until her follow-up appointment is had.  Discharged with short course of Percocet and Tylenol ibuprofen recommendations were given.  Lengthy discussion on the importance of being mostly reliant on NSAID instead of Percocet.  Patient ambulatory time of discharge.  All questions answered best my ability.  Final Clinical Impression(s) / ED Diagnoses Final diagnoses:  Closed nondisplaced fracture of head of left radius, initial encounter  Tenderness of anatomical snuffbox  Contusion of scalp, initial encounter    Rx / DC Orders ED Discharge Orders          Ordered    oxyCODONE-acetaminophen (PERCOCET/ROXICET) 5-325 MG tablet  Every 8 hours PRN        07/12/20 1021             Pati Gallo [place], [place] 07/13/20 1520    Fredia Sorrow, MD 07/14/20 1845

## 2020-07-12 NOTE — Progress Notes (Signed)
Orthopedic Tech Progress Note Patient Details:  Janice Terrell 10/14/1975 [id number]  Ortho Devices Type of Ortho Device: Sugartong splint, Sling immobilizer Ortho Device/Splint Location: Left Upper Extremity Ortho Device/Splint Interventions: Ordered, Application, Adjustment   Post Interventions Patient Tolerated: Well Instructions Provided: Adjustment of device, Care of device, Poper ambulation with device  Ahlijah Raia P Lorel Monaco 07/12/2020, 10:14 AM

## 2020-07-12 NOTE — ED Provider Notes (Signed)
Emergency Medicine Provider Triage Evaluation Note  Janice Terrell , a 45 y.o. female  was evaluated in triage.  Pt complains of upper extremity pain S/p fall. Tripped and fell down a few steps. No LOC. Pain to the right wrist and to the LUE.   Review of Systems  Positive: Arthralgias/myalgias Negative: Numbness, weakness, neck pain, back pain, chest pain  Physical Exam  BP (!) 155/82   Pulse 78   Temp 97.9 F (36.6 C)   Resp 17   Ht 5\' 2"  (1.575 m)   Wt 128 kg   LMP 08/09/2014   SpO2 98%   BMI 51.61 kg/m  Gen:   Awake, no distress   Resp:  Normal effort  MSK:   Mild limitation in LUE elbow/wrist ROM. No significant open wounds. Tender to palpation to the bilateral wrists, left forearm/elbow & distal 2/3rds of the humerus.   Medical Decision Making  Medically screening exam initiated at 05:00AM  Appropriate orders placed.  Dagmar Hait Shandreka Dante was informed that the remainder of the evaluation will be completed by another provider, this initial triage assessment does not replace that evaluation, and the importance of remaining in the ED until their evaluation is complete.  Fall.  Additional imaging ordered.    Amaryllis Dyke, PA-C 07/12/20 0554    Orpah Greek, MD 07/13/20 [phone]

## 2020-07-12 NOTE — Discharge Instructions (Addendum)
You have a fracture in your left arm at the elbow this is a radial head fracture.  As we discussed this is not a fracture like breaking a stick over your knee but rather a impacted fracture which shortens the bone slightly.  You also have potentially a fracture in your wrist.  This was splinted with the splint that was used to immobilize her elbow as well.  This way 1 splint was able to mobilize your entire arm.  You may use the sling however I recommend taking her arm out of the sling occasionally/3-4 times a day and moving her elbow and giant circles to help move your left shoulder and prevent it from "freezing"  As we discussed getting a very short course of Percocet, you to rely on Tylenol and ibuprofen primarily.  Please use Tylenol or ibuprofen for pain.  You may use 600 mg ibuprofen every 6 hours or 1000 mg of Tylenol every 6 hours.  You may choose to alternate between the 2.  This would be most effective.  Not to exceed 4 g of Tylenol within 24 hours.  Not to exceed 3200 mg ibuprofen 24 hours.

## 2020-07-20 DIAGNOSIS — S52122A Displaced fracture of head of left radius, initial encounter for closed fracture: Secondary | ICD-10-CM | POA: Insufficient documentation

## 2020-07-20 DIAGNOSIS — S52042A Displaced fracture of coronoid process of left ulna, initial encounter for closed fracture: Secondary | ICD-10-CM | POA: Insufficient documentation

## 2020-08-04 NOTE — Progress Notes (Signed)
Subjective:    Janice Terrell - 45 y.o. female MRN [id number]  Date of birth: [birth year]  HPI  Janice Terrell is to establish care. Patient has a PMH significant for left ovarian cyst, primary osteoarthritis of left knee, and irregular bleeding.   Current issues and/or concerns: WEIGHT GAIN: Tried Phentermine in the past and lost 30 pounds. She is exercising. Reports challenges with diet as she does like bread. Goal weight 170 pounds. She would also like to loose weight to hopefully improve her high blood pressure and she is taking medication for this. Reports home blood pressures are similar to today's office blood pressure. Considered lipo in the past and then decided against this. Would like to begin Phentermine again.    ROS per HPI    Health Maintenance:  Health Maintenance Due  Topic Date Due   COVID-19 Vaccine (1) Never done   HIV Screening  Never done   Hepatitis C Screening  Never done   TETANUS/TDAP  Never done   PAP SMEAR-Modifier  Never done    Past Medical History: Patient Active Problem List   Diagnosis Date Noted   Abnormal uterine bleeding 08/06/2020   Abnormal vaginal bleeding 08/06/2020   Pain in pelvis 08/06/2020   Primary osteoarthritis of left knee 02/21/2019   Left ovarian cyst 08/28/2018   Irregular bleeding 11/17/2014   Obesity, Class III, BMI 40-49.9 (morbid obesity) ([place]) 04/27/2011    Social History   reports that she has never smoked. She has never used smokeless tobacco. She reports current alcohol use. She reports that she does not use drugs.   Family History  family history includes Diabetes in her maternal grandmother, sister, and another family member.   Medications: reviewed and updated   Objective:   Physical Exam BP 124/85 (BP Location: Right Arm, Patient Position: Sitting, Cuff Size: Large)   Pulse 67   Temp 98.2 F (36.8 C)   Resp 18   Ht 5' 2.21" (1.58 m)   Wt 278 lb 9.6 oz (126.4 kg)   LMP 08/09/2014   SpO2  96%   BMI 50.62 kg/m  Physical Exam HENT:     Head: Normocephalic and atraumatic.  Eyes:     Extraocular Movements: Extraocular movements intact.     Conjunctiva/sclera: Conjunctivae normal.     Pupils: Pupils are equal, round, and reactive to light.  Cardiovascular:     Rate and Rhythm: Normal rate and regular rhythm.     Pulses: Normal pulses.     Heart sounds: Normal heart sounds.  Pulmonary:     Effort: Pulmonary effort is normal.     Breath sounds: Normal breath sounds.  Musculoskeletal:     Cervical back: Normal range of motion and neck supple.  Neurological:     General: No focal deficit present.     Mental Status: She is alert and oriented to person, place, and time.  Psychiatric:        Mood and Affect: Mood normal.        Behavior: Behavior normal.       Assessment & Plan:  1. Encounter to establish care: - Patient presents today to establish care.  - Return for annual physical examination, labs, and health maintenance. Arrive fasting meaning having no food for at least 8 hours prior to appointment. You may have only water or black coffee. Please take scheduled medications as normal.  2. Encounter for weight management: 3. Class 3 severe obesity with body mass index (BMI) of  50.0 to 59.9 in adult, unspecified obesity type, unspecified whether serious comorbidity present [facility]): - Begin low-dose Phentermine of entire mean to try curb her appetite.  Encouraged her to keep an eye on her blood pressure. Advised to let me know if she has any palpitations.  - Counseled Phentermine is not a long-term medication and a medication holiday will need to be taken after several months. Patient verbalized understanding.  - I did check the [facility] prescription drug database and found no frequent prescribers of opiates or evidence of aberrant behavior. - Follow-up with primary provider in 4 weeks or sooner if needed.  - phentermine 15 MG capsule; Take 1 capsule (15 mg total) by  mouth every morning.  Dispense: 30 capsule; Refill: 0   Patient was given clear instructions to go to Emergency Department or return to medical center if symptoms don't improve, worsen, or new problems develop.The patient verbalized understanding.  I discussed the assessment and treatment plan with the patient. The patient was provided an opportunity to ask questions and all were answered. The patient agreed with the plan and demonstrated an understanding of the instructions.   The patient was advised to call back or seek an in-person evaluation if the symptoms worsen or if the condition fails to improve as anticipated.    Durene Fruits, NP 08/06/2020, 12:56 PM Primary Care at [facility]

## 2020-08-06 ENCOUNTER — Encounter: Payer: Self-pay | Admitting: Family

## 2020-08-06 ENCOUNTER — Ambulatory Visit (INDEPENDENT_AMBULATORY_CARE_PROVIDER_SITE_OTHER): Payer: Managed Care, Other (non HMO) | Admitting: Family

## 2020-08-06 ENCOUNTER — Other Ambulatory Visit: Payer: Self-pay

## 2020-08-06 VITALS — BP 124/85 | HR 67 | Temp 98.2°F | Resp 18 | Ht 62.21 in | Wt 278.6 lb

## 2020-08-06 DIAGNOSIS — R102 Pelvic and perineal pain: Secondary | ICD-10-CM | POA: Insufficient documentation

## 2020-08-06 DIAGNOSIS — Z7689 Persons encountering health services in other specified circumstances: Secondary | ICD-10-CM | POA: Diagnosis not present

## 2020-08-06 DIAGNOSIS — N939 Abnormal uterine and vaginal bleeding, unspecified: Secondary | ICD-10-CM | POA: Insufficient documentation

## 2020-08-06 DIAGNOSIS — Z6841 Body Mass Index (BMI) 40.0 and over, adult: Secondary | ICD-10-CM

## 2020-08-06 MED ORDER — PHENTERMINE HCL 15 MG PO CAPS: 15.0000 mg | ORAL_CAPSULE | ORAL | 0 refills | Status: DC

## 2020-08-06 NOTE — Progress Notes (Signed)
Pt presents to establish care, pt wants to discuss starting Phentermine not happy with current weight

## 2020-08-06 NOTE — Patient Instructions (Signed)
Phentermine tablets or capsules What is this medication? PHENTERMINE (FEN ter meen) decreases your appetite. It is used with a reducedcalorie diet and exercise to help you lose weight. This medicine may be used for other purposes; ask your health care provider orpharmacist if you have questions. COMMON BRAND NAME(S): Adipex-P, Atti-Plex P, Atti-Plex P Spansule, Fastin,Lomaira, Pro-Fast, Tara-8 What should I tell my care team before I take this medication? They need to know if you have any of these conditions: agitation or nervousness diabetes glaucoma heart disease high blood pressure history of drug abuse or addiction history of stroke kidney disease lung disease called Primary Pulmonary Hypertension (PPH) taken an MAOI like Carbex, Eldepryl, Marplan, Nardil, or Parnate in last 14 days taking stimulant medicines for attention disorders, weight loss, or to stay awake thyroid disease an unusual or allergic reaction to phentermine, other medicines, foods, dyes, or preservatives pregnant or trying to get pregnant breast-feeding How should I use this medication? Take this medicine by mouth with a glass of water. Follow the directions on the prescription label. Take your medicine at regular intervals. Do not take itmore often than directed. Do not stop taking except on your doctor's advice. Talk to your pediatrician regarding the use of this medicine in children. While this drug may be prescribed for children 17 years or older for selectedconditions, precautions do apply. Overdosage: If you think you have taken too much of this medicine contact apoison control center or emergency room at once. NOTE: This medicine is only for you. Do not share this medicine with others. What if I miss a dose? If you miss a dose, take it as soon as you can. If it is almost time for yournext dose, take only that dose. Do not take double or extra doses. What may interact with this medication? Do not take this  medicine with any of the following medications: MAOIs like Carbex, Eldepryl, Marplan, Nardil, and Parnate This medicine may also interact with the following medications: alcohol certain medicines for depression, anxiety, or psychotic disorders certain medicines for high blood pressure linezolid medicines for colds or breathing difficulties like pseudoephedrine or phenylephrine medicines for diabetes sibutramine stimulant medicines for attention disorders, weight loss, or to stay awake This list may not describe all possible interactions. Give your health care provider a list of all the medicines, herbs, non-prescription drugs, or dietary supplements you use. Also tell them if you smoke, drink alcohol, or use illegaldrugs. Some items may interact with your medicine. What should I watch for while using this medication? Visit your doctor or health care provider for regular checks on your progress. Do not stop taking except on your health care provider's advice. You may develop a severe reaction. Your health care provider will tell you how muchmedicine to take. Do not take this medicine close to bedtime. It may prevent you from sleeping. You may get drowsy or dizzy. Do not drive, use machinery, or do anything that needs mental alertness until you know how this medicine affects you. Do not stand or sit up quickly, especially if you are an older patient. This reduces the risk of dizzy or fainting spells. Alcohol may increase dizziness anddrowsiness. Avoid alcoholic drinks. This medicine may affect blood sugar levels. Ask your healthcare provider ifchanges in diet or medicines are needed if you have diabetes. Women should inform their health care provider if they wish to become pregnant or think they might be pregnant. Losing weight while pregnant is not advised and may cause harm to the  unborn child. Talk to your health care provider formore information. What side effects may I notice from receiving  this medication? Side effects that you should report to your doctor or health care professionalas soon as possible: allergic reactions like skin rash, itching or hives, swelling of the face, lips, or tongue breathing problems changes in emotions or moods changes in vision chest pain or chest tightness fast, irregular heartbeat feeling faint or lightheaded increased blood pressure irritable restlessness tremors seizures signs and symptoms of a stroke like changes in vision; confusion; trouble speaking or understanding; severe headaches; sudden numbness or weakness of the face, arm or leg; trouble walking; dizziness; loss of balance or coordination unusually weak or tired Side effects that usually do not require medical attention (report to yourdoctor or health care professional if they continue or are bothersome): changes in taste constipation or diarrhea dizziness dry mouth headache trouble sleeping upset stomach This list may not describe all possible side effects. Call your doctor for medical advice about side effects. You may report side effects to FDA at1-800-FDA-1088. Where should I keep my medication? Keep out of the reach of children. This medicine can be abused. Keep your medicine in a safe place to protect it from theft. Do not share this medicine with anyone. Selling or giving away this medicine is dangerous and against thelaw. This medicine may cause harm and death if it is taken by other adults, children, or pets. Return medicine that has not been used to an official disposal site. Contact the DEA at (902)390-3578 or your city/county government to find a site. If you cannot return the medicine, mix any unused medicine with a substance like cat litter or coffee grounds. Then throw the medicine away in a sealed container like a sealed bag or coffee can with a lid. Do not use themedicine after the expiration date. Store at room temperature between 20 and 25 degrees C (68 and 77  degrees F).Keep container tightly closed. NOTE: This sheet is a summary. It may not cover all possible information. If you have questions about this medicine, talk to your doctor, pharmacist, orhealth care provider.  2022 Elsevier/Gold Standard (2018-11-22 12:54:20)

## 2020-08-19 ENCOUNTER — Other Ambulatory Visit: Payer: Self-pay

## 2020-09-06 ENCOUNTER — Encounter: Payer: Self-pay | Admitting: Family

## 2020-09-07 ENCOUNTER — Other Ambulatory Visit: Payer: Self-pay

## 2020-09-07 DIAGNOSIS — Z7689 Persons encountering health services in other specified circumstances: Secondary | ICD-10-CM

## 2020-09-07 MED ORDER — PHENTERMINE HCL 15 MG PO CAPS: 15.0000 mg | ORAL_CAPSULE | ORAL | 0 refills | Status: DC

## 2020-09-23 NOTE — Progress Notes (Signed)
Patient ID: Janice Terrell, female    DOB: September 03, 1975  MRN: [id number]  CC: Annual Physical Exam   Subjective: Janice Terrell is a 45 y.o. female who presents for annual physical exam.   Her concerns today include:   WEIGHT MANAGEMENT FOLLOW-UP: 08/06/2020: - Begin Phentermine   09/24/2020: Doing well on current regimen. Monitoring what she eats and being physically active. No issues/concerns.   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     amLODipine (NORVASC) 5 MG tablet Take 1 tablet (5 mg total) by mouth daily. 90 tablet 0   Ascorbic Acid (VITAMIN C) 1000 MG tablet Take 1,000 mg by mouth daily.     Diclofenac Sodium 2 % SOLN [place] 20 mg/gram/actuation (2 %) topical soln in metered-dose pump  APPLY 1 PUMP (1 GRAM) TO AFFECTED AREA TOPICALLY TWICE DAILY AS DIRECTED     meloxicam (MOBIC) 7.5 MG tablet meloxicam 7.5 mg tablet     naproxen (NAPROSYN) 375 MG tablet Take 1 tablet (375 mg total) by mouth 2 (two) times daily with a meal. 30 tablet 0   predniSONE (DELTASONE) 10 MG tablet prednisone 10 mg tablet     tiZANidine (ZANAFLEX) 4 MG tablet Take 1 tablet (4 mg total) by mouth at bedtime as needed for muscle spasms. 30 tablet 0   No current facility-administered medications on file prior to visit.    No Known Allergies  Social History   Socioeconomic History   Marital status: Married     Spouse name: Not on file   Number of children: Not on file   Years of education: Not on file   Highest education level: Not on file  Occupational History   Not on file  Tobacco Use   Smoking status: Never   Smokeless tobacco: Never  Vaping Use   Vaping Use: Never used  Substance and Sexual Activity   Alcohol use: Yes    Comment: occasionally   Drug use: No   Sexual activity: Not on file  Other Topics Concern   Not on file  Social History Narrative   Not on file   Social Determinants of Health   Financial Resource Strain: Not on file  Food Insecurity: Not on file  Transportation Needs: Not on file  Physical Activity: Not on file  Stress: Not on file  Social Connections: Not on file  Intimate Partner Violence: Not on file    Family History  Problem Relation Age of Onset   Diabetes Sister    Diabetes Maternal Grandmother    Diabetes Other     Past Surgical History:  Procedure Laterality Date   ABDOMINAL HYSTERECTOMY     BREATH TEK H PYLORI  05/19/2011   Procedure: [place];  Surgeon: Pedro Earls, MD;  Location: [facility];  Service: General;  Laterality: N/A;   [place]  PROPOFOL N/A 02/06/2020   Procedure: COLONOSCOPY WITH PROPOFOL;  Surgeon: Carol Ada, MD;  Location: [facility];  Service: Endoscopy;  Laterality: N/A;   CYSTOSCOPY  11/17/2014   Procedure: CYSTOSCOPY;  Surgeon: Crawford Givens, MD;  Location: [facility];  Service: Gynecology;;   LAPAROSCOPIC LYSIS OF ADHESIONS N/A 08/28/2018   Procedure: Laparoscopic Lysis Of Adhesions with repair of serosal tear of colon;  Surgeon: Crawford Givens, MD;  Location: [facility];  Service: Gynecology;  Laterality: N/A;   POLYPECTOMY  02/06/2020   Procedure: POLYPECTOMY;  Surgeon: Carol Ada, MD;  Location: [facility];  Service: Endoscopy;;   TUBAL LIGATION      ROS: Review of Systems Negative except as stated above  PHYSICAL EXAM: BP 137/89 (BP Location: Left Arm,  Patient Position: Sitting, Cuff Size: Large)   Pulse 78   Temp 98.1 F (36.7 C)   Resp 18   Ht 5' 2.21" (1.58 m)   Wt 278 lb (126.1 kg)   LMP 08/09/2014   SpO2 97%   BMI 50.51 kg/m   Physical Exam Exam conducted with a chaperone present.  HENT:     Head: Normocephalic and atraumatic.     Right Ear: Tympanic membrane, ear canal and external ear normal.     Left Ear: Tympanic membrane, ear canal and external ear normal.  Eyes:     Extraocular Movements: Extraocular movements intact.     Conjunctiva/sclera: Conjunctivae normal.     Pupils: Pupils are equal, round, and reactive to light.  Cardiovascular:     Rate and Rhythm: Normal rate and regular rhythm.     Pulses: Normal pulses.     Heart sounds: Normal heart sounds.  Pulmonary:     Effort: Pulmonary effort is normal.     Breath sounds: Normal breath sounds.  Chest:  Breasts:    Right: Normal.     Left: Normal.     Comments: Elmon Else, CMA present during exam. Abdominal:     General: Bowel sounds are normal.     Palpations: Abdomen is soft.  Genitourinary:    General: Normal vulva.     Vagina: Normal.     Cervix: Normal.     Uterus: Normal.      Adnexa: Right adnexa normal and left adnexa normal.     Comments: Elmon Else, CMA present during exam.  Musculoskeletal:        General: Normal range of motion.     Cervical back: Normal range of motion and neck supple.  Skin:    General: Skin is warm and dry.     Capillary Refill: Capillary refill takes less than 2 seconds.  Neurological:     General: No focal deficit present.     Mental Status: She is alert and oriented to person, place, and time.  Psychiatric:        Mood and Affect: Mood normal.        Behavior: Behavior normal.   ASSESSMENT AND PLAN: 1. Annual physical exam: - Counseled on 150 minutes of exercise per week as tolerated, healthy eating (including decreased daily intake of saturated fats, cholesterol, added sugars, sodium), STI  prevention, and routine healthcare maintenance.  2. Screening for metabolic disorder: - CMP last obtained 07/08/2020 and normal at that time.   3. Screening for deficiency anemia: - CBC last obtained 07/08/2020 and normal at that time.   4. Diabetes mellitus screening: - Hemoglobin A1c to screen for pre-diabetes/diabetes. - Hemoglobin A1c  5. Screening cholesterol level: - Lipid  panel to screen for high cholesterol.  - Lipid panel  6. Thyroid disorder screen: - TSH to check thyroid function.  - TSH  7. Need for hepatitis C screening test: - Hepatitis C antibody to screen for hepatitis C.  - Hepatitis C Antibody  8. Encounter for screening mammogram for malignant neoplasm of breast: - Referral for breast cancer screening by mammogram. Asked patient to call [facility] to assist with expediting scheduling. Patient agreeable.  - MM Digital Screening; Future  9. Pap smear for cervical cancer screening: - Cytology - PAP for cervical cancer screening.  - Cytology - PAP([place])  10. Routine screening for STI (sexually transmitted infection): - Cervicovaginal self-swab to screen for chlamydia, gonorrhea, trichomonas, bacterial vaginitis, and candida vaginitis. - Cervicovaginal ancillary only  11. Encounter for screening for HIV: - HIV antibody to screen for human immunodeficiency virus.  - HIV antibody (with reflex)  12. Encounter for weight management: 13. Class 3 severe obesity with body mass index (BMI) of 50.0 to 59.9 in adult, unspecified obesity type, unspecified whether serious comorbidity present [facility]): - Increase Phentermine from 15 mg daily to 30 mg daily.  - Discussed heart healthy diet and exercise regimen.  - Follow-up with primary provider in 4 weeks or sooner if needed.  - phentermine 30 MG capsule; Take 1 capsule (30 mg total) by mouth every morning.  Dispense: 30 capsule; Refill: 0    Patient was given the opportunity to ask  questions.  Patient verbalized understanding of the plan and was able to repeat key elements of the plan. Patient was given clear instructions to go to Emergency Department or return to medical center if symptoms don't improve, worsen, or new problems develop.The patient verbalized understanding.   Orders Placed This Encounter  Procedures   HIV antibody (with reflex)   Hepatitis C Antibody   Lipid panel   TSH   Hemoglobin A1c    Requested Prescriptions   Signed Prescriptions Disp Refills   phentermine 30 MG capsule 30 capsule 0    Sig: Take 1 capsule (30 mg total) by mouth every morning.    Return in about 4 weeks (around 10/22/2020) for Telephone Visit or next available weight management .  Camillia Herter, NP

## 2020-09-24 ENCOUNTER — Ambulatory Visit (INDEPENDENT_AMBULATORY_CARE_PROVIDER_SITE_OTHER): Payer: Managed Care, Other (non HMO) | Admitting: Family

## 2020-09-24 ENCOUNTER — Other Ambulatory Visit (HOSPITAL_COMMUNITY)
Admission: RE | Admit: 2020-09-24 | Discharge: 2020-09-24 | Disposition: A | Discharge status: Home / Self Care | Payer: Managed Care, Other (non HMO) | Source: Ambulatory Visit | Attending: Family | Admitting: Family

## 2020-09-24 ENCOUNTER — Other Ambulatory Visit: Payer: Self-pay

## 2020-09-24 VITALS — BP 137/89 | HR 78 | Temp 98.1°F | Resp 18 | Ht 62.21 in | Wt 278.0 lb

## 2020-09-24 DIAGNOSIS — Z113 Encounter for screening for infections with a predominantly sexual mode of transmission: Secondary | ICD-10-CM | POA: Diagnosis present

## 2020-09-24 DIAGNOSIS — B373 Candidiasis of vulva and vagina: Secondary | ICD-10-CM | POA: Insufficient documentation

## 2020-09-24 DIAGNOSIS — Z1231 Encounter for screening mammogram for malignant neoplasm of breast: Secondary | ICD-10-CM

## 2020-09-24 DIAGNOSIS — Z6841 Body Mass Index (BMI) 40.0 and over, adult: Secondary | ICD-10-CM

## 2020-09-24 DIAGNOSIS — Z124 Encounter for screening for malignant neoplasm of cervix: Secondary | ICD-10-CM | POA: Insufficient documentation

## 2020-09-24 DIAGNOSIS — Z130 Encounter for screening for diseases of the blood and blood-forming organs and certain disorders involving the immune mechanism: Secondary | ICD-10-CM

## 2020-09-24 DIAGNOSIS — Z0001 Encounter for general adult medical examination with abnormal findings: Secondary | ICD-10-CM

## 2020-09-24 DIAGNOSIS — Z114 Encounter for screening for human immunodeficiency virus [HIV]: Secondary | ICD-10-CM

## 2020-09-24 DIAGNOSIS — Z1211 Encounter for screening for malignant neoplasm of colon: Secondary | ICD-10-CM | POA: Insufficient documentation

## 2020-09-24 DIAGNOSIS — B9689 Other specified bacterial agents as the cause of diseases classified elsewhere: Secondary | ICD-10-CM | POA: Diagnosis not present

## 2020-09-24 DIAGNOSIS — Z7689 Persons encountering health services in other specified circumstances: Secondary | ICD-10-CM

## 2020-09-24 DIAGNOSIS — Z0000 Encounter for general adult medical examination without abnormal findings: Secondary | ICD-10-CM

## 2020-09-24 DIAGNOSIS — N760 Acute vaginitis: Secondary | ICD-10-CM | POA: Diagnosis not present

## 2020-09-24 DIAGNOSIS — Z131 Encounter for screening for diabetes mellitus: Secondary | ICD-10-CM

## 2020-09-24 DIAGNOSIS — Z1159 Encounter for screening for other viral diseases: Secondary | ICD-10-CM

## 2020-09-24 DIAGNOSIS — Z13220 Encounter for screening for lipoid disorders: Secondary | ICD-10-CM

## 2020-09-24 DIAGNOSIS — K5730 Diverticulosis of large intestine without perforation or abscess without bleeding: Secondary | ICD-10-CM | POA: Insufficient documentation

## 2020-09-24 DIAGNOSIS — Z13228 Encounter for screening for other metabolic disorders: Secondary | ICD-10-CM

## 2020-09-24 DIAGNOSIS — Z1329 Encounter for screening for other suspected endocrine disorder: Secondary | ICD-10-CM

## 2020-09-24 MED ORDER — PHENTERMINE HCL 30 MG PO CAPS: 30.0000 mg | ORAL_CAPSULE | ORAL | 0 refills | Status: DC

## 2020-09-24 NOTE — Progress Notes (Signed)
Pt presents for annual physical exam w/pap desires STD testing and Tdap vaccine  Needs refill on Phentermine

## 2020-09-24 NOTE — Patient Instructions (Signed)
Preventive Care 46-46 Years Old, Female Preventive care refers to lifestyle choices and visits with your health care provider that can promote health and wellness. This includes: A yearly physical exam. This is also called an annual wellness visit. Regular dental and eye exams. Immunizations. Screening for certain conditions. Healthy lifestyle choices, such as: Eating a healthy diet. Getting regular exercise. Not using drugs or products that contain nicotine and tobacco. Limiting alcohol use. What can I expect for my preventive care visit? Physical exam Your health care provider will check your: Height and weight. These may be used to calculate your BMI (body mass index). BMI is a measurement that tells if you are at a healthy weight. Heart rate and blood pressure. Body temperature. Skin for abnormal spots. Counseling Your health care provider may ask you questions about your: Past medical problems. Family's medical history. Alcohol, tobacco, and drug use. Emotional well-being. Home life and relationship well-being. Sexual activity. Diet, exercise, and sleep habits. Work and work Statistician. Access to firearms. Method of birth control. Menstrual cycle. Pregnancy history. What immunizations do I need?  Vaccines are usually given at various ages, according to a schedule. Your health care provider will recommend vaccines for you based on your age, medicalhistory, and lifestyle or other factors, such as travel or where you work. What tests do I need? Blood tests Lipid and cholesterol levels. These may be checked every 5 years, or more often if you are over 46 years old. Hepatitis C test. Hepatitis B test. Screening Lung cancer screening. You may have this screening every year starting at age 46 if you have a 30-pack-year history of smoking and currently smoke or have quit within the past 15 years. Colorectal cancer screening. All adults should have this screening starting at  age 46 and continuing until age 46. Your health care provider may recommend screening at age 46 if you are at increased risk. You will have tests every 1-10 years, depending on your results and the type of screening test. Diabetes screening. This is done by checking your blood sugar (glucose) after you have not eaten for a while (fasting). You may have this done every 1-3 years. Mammogram. This may be done every 1-2 years. Talk with your health care provider about when you should start having regular mammograms. This may depend on whether you have a family history of breast cancer. BRCA-related cancer screening. This may be done if you have a family history of breast, ovarian, tubal, or peritoneal cancers. Pelvic exam and Pap test. This may be done every 3 years starting at age 46. Starting at age 46, this may be done every 5 years if you have a Pap test in combination with an HPV test. Other tests STD (sexually transmitted disease) testing, if you are at risk. Bone density scan. This is done to screen for osteoporosis. You may have this scan if you are at high risk for osteoporosis. Talk with your health care provider about your test results, treatment options,and if necessary, the need for more tests. Follow these instructions at home: Eating and drinking  Eat a diet that includes fresh fruits and vegetables, whole grains, lean protein, and low-fat dairy products. Take vitamin and mineral supplements as recommended by your health care provider. Do not drink alcohol if: Your health care provider tells you not to drink. You are pregnant, may be pregnant, or are planning to become pregnant. If you drink alcohol: Limit how much you have to 0-1 drink a day. Be aware  of how much alcohol is in your drink. In the [place] one drink equals one 12 oz bottle of beer (355 mL), one 5 oz glass of wine (148 mL), or one 1 oz glass of hard liquor (44 mL).  Lifestyle Take daily care of your teeth and  gums. Brush your teeth every morning and night with fluoride toothpaste. Floss one time each day. Stay active. Exercise for at least 30 minutes 5 or more days each week. Do not use any products that contain nicotine or tobacco, such as cigarettes, e-cigarettes, and chewing tobacco. If you need help quitting, ask your health care provider. Do not use drugs. If you are sexually active, practice safe sex. Use a condom or other form of protection to prevent STIs (sexually transmitted infections). If you do not wish to become pregnant, use a form of birth control. If you plan to become pregnant, see your health care provider for a prepregnancy visit. If told by your health care provider, take low-dose aspirin daily starting at age 46. Find healthy ways to cope with stress, such as: Meditation, yoga, or listening to music. Journaling. Talking to a trusted person. Spending time with friends and family. Safety Always wear your seat belt while driving or riding in a vehicle. Do not drive: If you have been drinking alcohol. Do not ride with someone who has been drinking. When you are tired or distracted. While texting. Wear a helmet and other protective equipment during sports activities. If you have firearms in your house, make sure you follow all gun safety procedures. What's next? Visit your health care provider once a year for an annual wellness visit. Ask your health care provider how often you should have your eyes and teeth checked. Stay up to date on all vaccines. This information is not intended to replace advice given to you by your health care provider. Make sure you discuss any questions you have with your healthcare provider. Document Revised: 10/21/2019 Document Reviewed: 09/27/2017 Elsevier Patient Education  2022 [employer].

## 2020-09-27 ENCOUNTER — Encounter: Payer: Self-pay | Admitting: Family

## 2020-09-27 DIAGNOSIS — R7303 Prediabetes: Secondary | ICD-10-CM | POA: Insufficient documentation

## 2020-09-27 LAB — LIPID PANEL
Chol/HDL Ratio: 2.6 ratio (ref 0.0–4.4)
Cholesterol, Total: 109 mg/dL (ref 100–199)
HDL: 42 mg/dL (ref >39)
LDL Chol Calc (NIH): 54 mg/dL (ref 0–99)
Triglycerides: 55 mg/dL (ref 0–149)
VLDL Cholesterol Cal: 13 mg/dL (ref 5–40)

## 2020-09-27 LAB — HEPATITIS C ANTIBODY: Hep C Virus Ab: <0.1 s/co ratio (ref 0.0–0.9)

## 2020-09-27 LAB — HEMOGLOBIN A1C
Est. average glucose Bld gHb Est-mCnc: 126 mg/dL
Hgb A1c MFr Bld: 6 % — ABNORMAL HIGH (ref 4.8–5.6)

## 2020-09-27 LAB — TSH: TSH: 0.473 u[IU]/mL (ref 0.450–4.500)

## 2020-09-27 LAB — HIV ANTIBODY (ROUTINE TESTING W REFLEX): HIV Screen 4th Generation wRfx: NONREACTIVE

## 2020-09-27 NOTE — Progress Notes (Signed)
Thyroid function normal.   Cholesterol normal.   Hepatitis C negative.   HIV negative.   Hemoglobin A1c is consistent with pre-diabetes. Practice healthy eating habits of fresh fruit and vegetables, lean baked meats such as chicken, fish, and [place]; limit breads, rice, pastas, and desserts; practice regular aerobic exercise (at least 150 minutes a week as tolerated). No medication needed at the moment. Encouraged to recheck in 6 months.

## 2020-09-28 ENCOUNTER — Other Ambulatory Visit: Payer: Self-pay | Admitting: Family

## 2020-09-28 DIAGNOSIS — B3731 Acute candidiasis of vulva and vagina: Secondary | ICD-10-CM | POA: Insufficient documentation

## 2020-09-28 DIAGNOSIS — B9689 Other specified bacterial agents as the cause of diseases classified elsewhere: Secondary | ICD-10-CM

## 2020-09-28 DIAGNOSIS — B373 Candidiasis of vulva and vagina: Secondary | ICD-10-CM | POA: Insufficient documentation

## 2020-09-28 LAB — CERVICOVAGINAL ANCILLARY ONLY
Bacterial Vaginitis (gardnerella): POSITIVE — AB
Candida Glabrata: NEGATIVE
Candida Vaginitis: POSITIVE — AB
Chlamydia: NEGATIVE
Comment: NEGATIVE
Comment: NEGATIVE
Comment: NEGATIVE
Comment: NEGATIVE
Comment: NEGATIVE
Comment: NORMAL
Neisseria Gonorrhea: NEGATIVE
Trichomonas: NEGATIVE

## 2020-09-28 MED ORDER — METRONIDAZOLE 500 MG PO TABS: 500.0000 mg | ORAL_TABLET | Freq: Two times a day (BID) | ORAL | 0 refills | Status: AC

## 2020-09-28 MED ORDER — FLUCONAZOLE 150 MG PO TABS: 150.0000 mg | ORAL_TABLET | Freq: Once | ORAL | 0 refills | Status: AC

## 2020-09-28 NOTE — Progress Notes (Signed)
Gonorrhea, Chlamydia, and Trichomonas negative.   Positive for Bacterial Vaginitis, an overgrowth of normal bacteria in the vagina due to changes in pH. Prescribed Metronidazole (Flagyl) twice per day for 7 days. Do not drink alcohol while taking this medication.   Positive for Candida Vaginitis which is better known as a yeast infection. Prescribed Fluconazole (Diflucan) 150 mg (1 tablet) for a one time dose to treat this.

## 2020-09-29 LAB — CYTOLOGY - PAP
Comment: NEGATIVE
Diagnosis: NEGATIVE
High risk HPV: NEGATIVE

## 2020-09-29 NOTE — Progress Notes (Signed)
HPV negative.   PAP no malignancy or lesion.  Repeat PAP in 3 years or sooner if needed.

## 2020-10-21 NOTE — Progress Notes (Signed)
Erroneous encounter

## 2020-10-25 ENCOUNTER — Other Ambulatory Visit: Payer: Self-pay

## 2020-10-25 ENCOUNTER — Encounter: Payer: Managed Care, Other (non HMO) | Admitting: Family

## 2020-10-25 DIAGNOSIS — Z7689 Persons encountering health services in other specified circumstances: Secondary | ICD-10-CM

## 2020-11-05 ENCOUNTER — Other Ambulatory Visit: Payer: Self-pay

## 2020-11-05 ENCOUNTER — Ambulatory Visit
Admission: RE | Admit: 2020-11-05 | Discharge: 2020-11-05 | Disposition: A | Discharge status: Home / Self Care | Payer: Managed Care, Other (non HMO) | Source: Ambulatory Visit | Attending: Family | Admitting: Family

## 2020-11-05 DIAGNOSIS — Z1231 Encounter for screening mammogram for malignant neoplasm of breast: Secondary | ICD-10-CM

## 2020-11-10 NOTE — Progress Notes (Signed)
No malignancy.  Repeat mammogram in 1 year.

## 2020-12-03 ENCOUNTER — Other Ambulatory Visit: Payer: Self-pay

## 2020-12-03 ENCOUNTER — Emergency Department (HOSPITAL_BASED_OUTPATIENT_CLINIC_OR_DEPARTMENT_OTHER)
Admission: EM | Admit: 2020-12-03 | Discharge: 2020-12-03 | Disposition: A | Discharge status: Home / Self Care | Payer: Managed Care, Other (non HMO) | Attending: Emergency Medicine | Admitting: Emergency Medicine

## 2020-12-03 ENCOUNTER — Encounter (HOSPITAL_BASED_OUTPATIENT_CLINIC_OR_DEPARTMENT_OTHER): Payer: Self-pay

## 2020-12-03 DIAGNOSIS — R519 Headache, unspecified: Secondary | ICD-10-CM | POA: Diagnosis present

## 2020-12-03 DIAGNOSIS — G44209 Tension-type headache, unspecified, not intractable: Secondary | ICD-10-CM | POA: Insufficient documentation

## 2020-12-03 DIAGNOSIS — I10 Essential (primary) hypertension: Secondary | ICD-10-CM | POA: Diagnosis not present

## 2020-12-03 MED ORDER — IBUPROFEN 400 MG PO TABS
600.0000 mg | ORAL_TABLET | Freq: Once | ORAL | Status: AC
  Administered 2020-12-03: 600 mg via ORAL
  Filled 2020-12-03: qty 1

## 2020-12-03 NOTE — ED Provider Notes (Signed)
Emergency Department Provider Note   I have reviewed the triage vital signs and the nursing notes.   HISTORY  Chief Complaint Hypertension and Headache   HPI Janice Terrell is a 45 y.o. female presents to the ED with HA and elevated BP. Patient noticed symptoms. No CP or SOB. No numbness/weakness. No vision change. Has been compliant with her medications, including Amlodipine. HA is moderate and gradual onset. No sudden onset, maximal intensity HA. No fever/chills. No radiation of symptoms.    Past Medical History:  Diagnosis Date   Arthritis    Obesity, Class III, BMI 40-49.9 (morbid obesity) ([place]) 04/27/2011    Patient Active Problem List   Diagnosis Date Noted   Bacterial vaginitis 09/28/2020   Candida vaginitis 09/28/2020   Prediabetes 09/27/2020   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/24/2020   Screening for malignant neoplasm of colon 09/24/2020   Abnormal uterine bleeding 08/06/2020   Abnormal vaginal bleeding 08/06/2020   Pain in pelvis 08/06/2020   Primary osteoarthritis of left knee 02/21/2019   Left ovarian cyst 08/28/2018   Irregular bleeding 11/17/2014   Obesity due to excess calories 04/27/2011    Past Surgical History:  Procedure Laterality Date   ABDOMINAL HYSTERECTOMY     BREATH TEK H PYLORI  05/19/2011   Procedure: BREATH TEK H PYLORI;  Surgeon: Pedro Earls, MD;  Location: [facility];  Service: General;  Laterality: N/A;   [place]   COLONOSCOPY WITH PROPOFOL N/A 02/06/2020   Procedure: COLONOSCOPY WITH PROPOFOL;  Surgeon: Carol Ada, MD;  Location: [facility];  Service: Endoscopy;  Laterality: N/A;   CYSTOSCOPY  11/17/2014   Procedure: CYSTOSCOPY;  Surgeon: Crawford Givens, MD;  [facility];  Service: Gynecology;;   LAPAROSCOPIC LYSIS OF ADHESIONS N/A 08/28/2018   Procedure: Laparoscopic Lysis Of Adhesions with repair of serosal tear of colon;  Surgeon: Crawford Givens, MD;  Location: [facility];  Service: Gynecology;   Laterality: N/A;   POLYPECTOMY  02/06/2020   Procedure: POLYPECTOMY;  Surgeon: Carol Ada, MD;  Location: [facility];  Service: Endoscopy;;   TUBAL LIGATION      Allergies Patient has no known allergies.  Family History  Problem Relation Age of Onset   Diabetes Sister    Diabetes Maternal Grandmother    Diabetes Other     Social History Social History   Tobacco Use   Smoking status: Never   Smokeless tobacco: Never  Vaping Use   Vaping Use: Never used  Substance Use Topics   Alcohol use: Yes    Comment: occasionally   Drug use: No    Review of Systems  Constitutional: No fever/chills Eyes: No visual changes. ENT: No sore throat. Cardiovascular: Denies chest pain. Positive elevated BP.  Respiratory: Denies shortness of breath. Gastrointestinal: No abdominal pain.  No nausea, no vomiting.  No diarrhea.  No constipation. Genitourinary: Negative for dysuria. Musculoskeletal: Negative for back pain. Skin: Negative for rash. Neurological: Negative for focal weakness or numbness. Positive HA.   10-point ROS otherwise negative.  ____________________________________________   PHYSICAL EXAM:  VITAL SIGNS: ED Triage Vitals  Enc Vitals Group     BP 12/03/20 1039 (!) 146/81     Pulse Rate 12/03/20 1039 90     Resp 12/03/20 1039 14     Temp 12/03/20 1039 98.8 F (37.1 C)     Temp src --      SpO2 12/03/20 1039 98 %     Weight 12/03/20 1037 250 lb (113.4  kg)     Height 12/03/20 1037 5\' 2"  (1.575 m)   Constitutional: Alert and oriented. Well appearing and in no acute distress. Eyes: Conjunctivae are normal. PERRL. EOMI. Head: Atraumatic. Nose: No congestion/rhinnorhea. Mouth/Throat: Mucous membranes are moist.  Neck: No stridor.   Cardiovascular: Normal rate, regular rhythm. Good peripheral circulation. Grossly normal heart sounds.   Respiratory: Normal respiratory effort.  No retractions. Lungs CTAB. Gastrointestinal: Soft and nontender. No distention.   Musculoskeletal: No lower extremity tenderness nor edema. No gross deformities of extremities. Neurologic:  Normal speech and language. No gross focal neurologic deficits are appreciated.  Skin:  Skin is warm, dry and intact. No rash noted.   ____________________________________________   PROCEDURES  Procedure(s) performed:   Procedures  None ____________________________________________   INITIAL IMPRESSION / ASSESSMENT AND PLAN / ED COURSE  Pertinent labs & imaging results that were available during my care of the patient were reviewed by me and considered in my medical decision making (see chart for details).   Patient presents to the ED with elevated BP at home and HA.   Differential diagnosis includes but is not exclusive to subarachnoid hemorrhage, meningitis, encephalitis, previous head trauma, cavernous venous thrombosis, muscle tension headache, glaucoma, temporal arteritis, migraine or migraine equivalent, etc.  HA without concerning features. No active HA at this time for ED treatment. Normal neuro exam. No evidence of HTN emergency. BP only slightly elevated here (146/81). Plan to continue monitoring BP and recording values at home for PCP appointment/review.   At this time, I do not feel there is any life-threatening condition present.I have reviewed nursing notes and appropriate previous records.  I feel the patient is safe to be discharged home without further emergent workup. Discussed usual and customary return precautions. Patient and family (if present) verbalize understanding and are comfortable with this plan.  Patient will follow-up with their primary care provider. If they do not have a primary care provider, information for follow-up has been provided to them. All questions have been answered.   ____________________________________________  FINAL CLINICAL IMPRESSION(S) / ED DIAGNOSES  Final diagnoses:  Primary hypertension  Tension headache      MEDICATIONS GIVEN DURING THIS VISIT:  Medications  ibuprofen (ADVIL) tablet 600 mg (600 mg Oral Given 12/03/20 1059)    Note:  This document was prepared using Dragon voice recognition software and may include unintentional dictation errors.  Nanda Quinton, MD, [facility] Emergency Medicine    Orie Baxend[place], [facility], MD 12/10/20 [phone]

## 2020-12-03 NOTE — ED Triage Notes (Signed)
Pt reports L sided HA and HTN since yesterday. SBP 190/105 PTA per pt. Denies CP, SOB, neuro deficits. Pt currently on amlodipine 5 mg, last dose this morning.

## 2020-12-03 NOTE — Discharge Instructions (Signed)
You were seen in the emergency department today with elevated blood pressure.  I have included a blood pressure record sheet to keep track of your blood pressures at home.  Please call your primary care doctor today to schedule a follow-up appointment, ideally next week, to review your blood pressure chart and manage her medications.  If you develop any new or suddenly worsening symptoms please return to the emergency department for reevaluation.

## 2021-01-26 ENCOUNTER — Encounter: Payer: Self-pay | Admitting: Family

## 2021-01-27 NOTE — Progress Notes (Signed)
Patient ID: Janice Terrell, female    DOB: 1975-12-06  MRN: [id number]  CC: Hypertension Follow-Up   Subjective: Janice Terrell is a 45 y.o. female who presents for hypertension follow-up.   Her concerns today include:  HYPERTENSION FOLLOW-UP: Doing well on current regimen. No side effects. No issues/concerns. Denies chest pain and shortness of breath.    2. WEIGHT MANAGEMENT FOLLOW-UP: Reports she doesn't eat too much maybe one meal daily. Walking for exercise. Interested in maybe trying weight loss injections after completion of Phentermine.   Patient Active Problem List   Diagnosis Date Noted   Bacterial vaginitis 09/28/2020   Candida vaginitis 09/28/2020   Prediabetes 09/27/2020   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/24/2020   Screening for malignant neoplasm of colon 09/24/2020   Abnormal uterine bleeding 08/06/2020   Abnormal vaginal bleeding 08/06/2020   Pain in pelvis 08/06/2020   Closed fracture of coronoid process of left ulna 07/20/2020   Closed fracture of head of left radius 07/20/2020   Primary osteoarthritis of left knee 02/21/2019   Cyst of left ovary 08/28/2018   Irregular periods 11/17/2014   Morbid obesity ([place]) 04/27/2011     Current Outpatient Medications on File Prior to Visit  Medication Sig Dispense Refill   acetaminophen (TYLENOL) 500 MG tablet Take 1,000 mg by mouth every 6 (six) hours as needed for headache or moderate pain.     Ascorbic Acid (VITAMIN C) 1000 MG tablet Take 1,000 mg by mouth daily.     Diclofenac Sodium 2 % SOLN Pennsaid 20 mg/gram/actuation (2 %) topical soln in metered-dose pump  APPLY 1 PUMP (1 GRAM) TO AFFECTED AREA TOPICALLY TWICE DAILY AS DIRECTED     meloxicam (MOBIC) 7.5 MG tablet meloxicam 7.5 mg tablet     naproxen (NAPROSYN) 375 MG tablet Take 1 tablet (375 mg total) by mouth 2 (two) times daily with a meal. 30 tablet 0   predniSONE (DELTASONE) 10 MG tablet prednisone 10 mg tablet     tiZANidine (ZANAFLEX) 4 MG tablet  Take 1 tablet (4 mg total) by mouth at bedtime as needed for muscle spasms. 30 tablet 0   No current facility-administered medications on file prior to visit.    No Known Allergies  Social History   Socioeconomic History   Marital status: Married    Spouse name: Not on file   Number of children: Not on file   Years of education: Not on file   Highest education level: Not on file  Occupational History   Not on file  Tobacco Use   Smoking status: Never   Smokeless tobacco: Never  Vaping Use   Vaping Use: Never used  Substance and Sexual Activity   Alcohol use: Yes    Comment: occasionally   Drug use: No   Sexual activity: Not on file  Other Topics Concern   Not on file  Social History Narrative   Not on file   Social Determinants of Health   Financial Resource Strain: Not on file  Food Insecurity: Not on file  Transportation Needs: Not on file  Physical Activity: Not on file  Stress: Not on file  Social Connections: Not on file  Intimate Partner Violence: Not on file    Family History  Problem Relation Age of Onset   Diabetes Sister    Diabetes Maternal Grandmother    Diabetes Other     Past Surgical History:  Procedure Laterality Date   ABDOMINAL HYSTERECTOMY     BREATH TEK  H PYLORI  05/19/2011   Procedure: BREATH TEK H PYLORI;  Surgeon: Pedro Earls, MD;  Location: [facility];  Service: General;  Laterality: N/A;   [place]   COLONOSCOPY WITH PROPOFOL N/A 02/06/2020   Procedure: COLONOSCOPY WITH PROPOFOL;  Surgeon: Carol Ada, MD;  Location: [facility];  Service: Endoscopy;  Laterality: N/A;   CYSTOSCOPY  11/17/2014   Procedure: CYSTOSCOPY;  Surgeon: Crawford Givens, MD;  Location: [facility];  Service: Gynecology;;   LAPAROSCOPIC LYSIS OF ADHESIONS N/A 08/28/2018   Procedure: Laparoscopic Lysis Of Adhesions with repair of serosal tear of colon;  Surgeon: Crawford Givens, MD;  Location: [facility];  Service: Gynecology;  Laterality: N/A;    POLYPECTOMY  02/06/2020   Procedure: POLYPECTOMY;  Surgeon: Carol Ada, MD;  Location: [facility];  Service: Endoscopy;;   TUBAL LIGATION      ROS: Review of Systems Negative except as stated above  PHYSICAL EXAM: BP 122/81 (BP Location: Left Arm, Patient Position: Sitting, Cuff Size: Large)    Pulse 72    Temp 98.3 F (36.8 C)    Resp 18    Ht 5' 2.21" (1.58 m)    Wt 284 lb (128.8 kg)    LMP 08/09/2014    SpO2 96%    BMI 51.60 kg/m   Wt Readings from Last 3 Encounters:  02/02/21 284 lb (128.8 kg)  12/03/20 250 lb (113.4 kg)  09/24/20 278 lb (126.1 kg)   Physical Exam HENT:     Head: Normocephalic and atraumatic.  Eyes:     Extraocular Movements: Extraocular movements intact.     Conjunctiva/sclera: Conjunctivae normal.     Pupils: Pupils are equal, round, and reactive to light.  Cardiovascular:     Rate and Rhythm: Normal rate and regular rhythm.     Pulses: Normal pulses.     Heart sounds: Normal heart sounds.  Pulmonary:     Effort: Pulmonary effort is normal.     Breath sounds: Normal breath sounds.  Musculoskeletal:     Cervical back: Normal range of motion and neck supple.  Neurological:     General: No focal deficit present.     Mental Status: She is alert and oriented to person, place, and time.  Psychiatric:        Mood and Affect: Mood normal.        Behavior: Behavior normal.   ASSESSMENT AND PLAN: 1. Primary hypertension: - Continue Amlodipine as prescribed.  - Counseled on blood pressure goal of less than 130/80, low-sodium, DASH diet, medication compliance, 150 minutes of moderate intensity exercise per week as tolerated. Discussed medication compliance, adverse effects. - Update BMP. - Follow-up with primary provider in 3 months or sooner if needed.  - Basic Metabolic Panel - amLODipine (NORVASC) 5 MG tablet; Take 1 tablet (5 mg total) by mouth daily.  Dispense: 90 tablet; Refill: 0  2. Encounter for weight management: 3. Class 3 severe obesity  with body mass index (BMI) of 50.0 to 59.9 in adult, unspecified obesity type, unspecified whether serious comorbidity present ([place]): - Continue Phentermine as prescribed. Discussed medication compliance and adverse effects. - Counseled on DASH diet and routine exercise regimen. - Patient considering beginning weight loss injections after completion of Phentermine.  - Follow-up with primary provider in 4 weeks or sooner if needed.  - phentermine 37.5 MG capsule; Take 1 capsule (37.5 mg total) by mouth every morning.  Dispense: 30 capsule; Refill: 0  4. Need for Tdap vaccination: -  Administered today in office.  - Tdap vaccine greater than or equal to 7yo IM    Patient was given the opportunity to ask questions.  Patient verbalized understanding of the plan and was able to repeat key elements of the plan. Patient was given clear instructions to go to Emergency Department or return to medical center if symptoms don't improve, worsen, or new problems develop.The patient verbalized understanding.   Orders Placed This Encounter  Procedures   Tdap vaccine greater than or equal to 7yo IM   Basic Metabolic Panel     Requested Prescriptions   Signed Prescriptions Disp Refills   phentermine 37.5 MG capsule 30 capsule 0    Sig: Take 1 capsule (37.5 mg total) by mouth every morning.   amLODipine (NORVASC) 5 MG tablet 90 tablet 0    Sig: Take 1 tablet (5 mg total) by mouth daily.    Return in about 3 months (around 05/03/2021) for Follow-Up or next available hypertension and 4 weeks weight management.  Camillia Herter, NP

## 2021-02-02 ENCOUNTER — Other Ambulatory Visit: Payer: Self-pay

## 2021-02-02 ENCOUNTER — Ambulatory Visit (INDEPENDENT_AMBULATORY_CARE_PROVIDER_SITE_OTHER): Payer: Managed Care, Other (non HMO) | Admitting: Family

## 2021-02-02 ENCOUNTER — Encounter: Payer: Self-pay | Admitting: Family

## 2021-02-02 VITALS — BP 122/81 | HR 72 | Temp 98.3°F | Resp 18 | Ht 62.21 in | Wt 284.0 lb

## 2021-02-02 DIAGNOSIS — Z23 Encounter for immunization: Secondary | ICD-10-CM | POA: Diagnosis not present

## 2021-02-02 DIAGNOSIS — Z6841 Body Mass Index (BMI) 40.0 and over, adult: Secondary | ICD-10-CM

## 2021-02-02 DIAGNOSIS — I10 Essential (primary) hypertension: Secondary | ICD-10-CM

## 2021-02-02 DIAGNOSIS — Z7689 Persons encountering health services in other specified circumstances: Secondary | ICD-10-CM

## 2021-02-02 MED ORDER — AMLODIPINE BESYLATE 5 MG PO TABS: 5.0000 mg | ORAL_TABLET | Freq: Every day | ORAL | 0 refills | Status: DC

## 2021-02-02 MED ORDER — PHENTERMINE HCL 37.5 MG PO CAPS: 37.5000 mg | ORAL_CAPSULE | ORAL | 0 refills | Status: DC

## 2021-02-02 NOTE — Progress Notes (Signed)
Pt presents for hypertension follow-up, wants higher dosage of phentermine

## 2021-02-03 LAB — BASIC METABOLIC PANEL
BUN/Creatinine Ratio: 25 — ABNORMAL HIGH (ref 9–23)
BUN: 15 mg/dL (ref 6–24)
CO2: 25 mmol/L (ref 20–29)
Calcium: 8.9 mg/dL (ref 8.7–10.2)
Chloride: 103 mmol/L (ref 96–106)
Creatinine, Ser: 0.6 mg/dL (ref 0.57–1.00)
Glucose: 87 mg/dL (ref 70–99)
Potassium: 4.5 mmol/L (ref 3.5–5.2)
Sodium: 140 mmol/L (ref 134–144)
eGFR: 112 mL/min/{1.73_m2} (ref >59)

## 2021-02-03 NOTE — Progress Notes (Signed)
Kidney function normal

## 2021-02-09 ENCOUNTER — Other Ambulatory Visit: Payer: Self-pay

## 2021-02-09 ENCOUNTER — Ambulatory Visit
Admission: EM | Admit: 2021-02-09 | Discharge: 2021-02-09 | Disposition: A | Discharge status: Home / Self Care | Payer: Managed Care, Other (non HMO) | Attending: Physician Assistant | Admitting: Physician Assistant

## 2021-02-09 DIAGNOSIS — M5442 Lumbago with sciatica, left side: Secondary | ICD-10-CM

## 2021-02-09 HISTORY — DX: Essential (primary) hypertension: I10

## 2021-02-09 MED ORDER — PREDNISONE 20 MG PO TABS: 40.0000 mg | ORAL_TABLET | Freq: Every day | ORAL | 0 refills | Status: AC

## 2021-02-09 MED ORDER — CYCLOBENZAPRINE HCL 10 MG PO TABS: 10.0000 mg | ORAL_TABLET | Freq: Two times a day (BID) | ORAL | 0 refills | Status: DC | PRN

## 2021-02-09 NOTE — ED Triage Notes (Signed)
Pt c/o left hip and buttocks pain that is moving down the left leg. Denies remembering direct injury or trauma to the area except for a fall x 1 year ago. States 10/10 pain and unable to offer a descriptive term.

## 2021-02-10 NOTE — ED Provider Notes (Signed)
[facility]    CSN: [id number] Arrival date & time: 02/09/21  1709      History   Chief Complaint Chief Complaint  Patient presents with   left hip pain    HPI Janice Terrell is a 46 y.o. female.   Patient here today for evaluation of left lower back pain that radiates into her left leg. She reports that pain started today. She has not had any numbness or tingling. She reports that certain positions, sitting for a long time, etc make pain worse. She has not had injury other than a fall about a year ago.   The history is provided by the patient.   Past Medical History:  Diagnosis Date   Arthritis    Hypertension    Morbid obesity ([place]) 04/27/2011   Obesity, Class III, BMI 40-49.9 (morbid obesity) ([place]) 04/27/2011    Patient Active Problem List   Diagnosis Date Noted   Bacterial vaginitis 09/28/2020   Candida vaginitis 09/28/2020   Prediabetes 09/27/2020   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/24/2020   Screening for malignant neoplasm of colon 09/24/2020   Abnormal uterine bleeding 08/06/2020   Abnormal vaginal bleeding 08/06/2020   Pain in pelvis 08/06/2020   Closed fracture of coronoid process of left ulna 07/20/2020   Closed fracture of head of left radius 07/20/2020   Primary osteoarthritis of left knee 02/21/2019   Cyst of left ovary 08/28/2018   Irregular periods 11/17/2014   Morbid obesity ([place]) 04/27/2011    Past Surgical History:  Procedure Laterality Date   ABDOMINAL HYSTERECTOMY     BREATH TEK H PYLORI  05/19/2011   Procedure: BREATH TEK H PYLORI;  Surgeon: Pedro Earls, MD;  Location: [facility];  Service: General;  Laterality: N/A;   [place]   COLONOSCOPY WITH PROPOFOL N/A 02/06/2020   Procedure: COLONOSCOPY WITH PROPOFOL;  Surgeon: Carol Ada, MD;  Location: [facility];  Service: Endoscopy;  Laterality: N/A;   CYSTOSCOPY  11/17/2014   Procedure: CYSTOSCOPY;  Surgeon: Crawford Givens, MD;  Location: [facility];   Service: Gynecology;;   LAPAROSCOPIC LYSIS OF ADHESIONS N/A 08/28/2018   Procedure: Laparoscopic Lysis Of Adhesions with repair of serosal tear of colon;  Surgeon: Crawford Givens, MD;  Location: [facility];  Service: Gynecology;  Laterality: N/A;   POLYPECTOMY  02/06/2020   Procedure: POLYPECTOMY;  Surgeon: Carol Ada, MD;  [facility]: [facility];  Service: Endoscopy;;   TUBAL LIGATION      OB History   No obstetric history on file.      Home Medications    Prior to Admission medications   Medication Sig Start Date End Date Taking? Authorizing Provider  cyclobenzaprine (FLEXERIL) 10 MG tablet Take 1 tablet (10 mg total) by mouth 2 (two) times daily as needed for muscle spasms. 02/09/21  Yes Francene Finders, PA-C  predniSONE (DELTASONE) 20 MG tablet Take 2 tablets (40 mg total) by mouth daily with breakfast for 5 days. 02/09/21 02/14/21 Yes Francene Finders, PA-C  acetaminophen (TYLENOL) 500 MG tablet Take 1,000 mg by mouth every 6 (six) hours as needed for headache or moderate pain.    [provider]  amLODipine (NORVASC) 5 MG tablet Take 1 tablet (5 mg total) by mouth daily. 02/02/21 05/03/21  Camillia Herter, NP  Ascorbic Acid (VITAMIN C) 1000 MG tablet Take 1,000 mg by mouth daily.    [provider]  Diclofenac Sodium 2 % SOLN Pennsaid 20 mg/gram/actuation (2 %) topical soln  in metered-dose pump  APPLY 1 PUMP (1 GRAM) TO AFFECTED AREA TOPICALLY TWICE DAILY AS DIRECTED    [provider]  meloxicam (MOBIC) 7.5 MG tablet meloxicam 7.5 mg tablet    [provider]  naproxen (NAPROSYN) 375 MG tablet Take 1 tablet (375 mg total) by mouth 2 (two) times daily with a meal. 07/03/20   Jaynee Eagles, PA-C  phentermine 37.5 MG capsule Take 1 capsule (37.5 mg total) by mouth every morning. 02/02/21 03/04/21  Camillia Herter, NP    Family History Family History  Problem Relation Age of Onset   Diabetes Sister    Diabetes Maternal Grandmother    Diabetes Other     Social  History Social History   Tobacco Use   Smoking status: Never   Smokeless tobacco: Never  Vaping Use   Vaping Use: Never used  Substance Use Topics   Alcohol use: Yes    Comment: occasionally   Drug use: No     Allergies   Patient has no known allergies.   Review of Systems Review of Systems  Constitutional:  Negative for chills and fever.  Eyes:  Negative for discharge and redness.  Respiratory:  Negative for shortness of breath.   Gastrointestinal:  Negative for abdominal pain, nausea and vomiting.  Musculoskeletal:  Positive for back pain and myalgias.  Neurological:  Negative for numbness.    Physical Exam Triage Vital Signs ED Triage Vitals  Enc Vitals Group     BP 02/09/21 1822 (!) 173/98     Pulse Rate 02/09/21 1822 93     Resp 02/09/21 1822 18     Temp 02/09/21 1822 98 F (36.7 C)     Temp Source 02/09/21 1822 Oral     SpO2 02/09/21 1822 98 %     Weight --      Height --      Head Circumference --      Peak Flow --      Pain Score 02/09/21 1823 0     Pain Loc --      Pain Edu? --      Excl. in [place]? --    No data found.  Updated Vital Signs BP (!) 173/98 (BP Location: Left Arm)    Pulse 93    Temp 98 F (36.7 C) (Oral)    Resp 18    LMP 08/09/2014    SpO2 98%      Physical Exam Vitals and nursing note reviewed.  Constitutional:      General: She is not in acute distress.    Appearance: Normal appearance. She is not ill-appearing.  HENT:     Head: Normocephalic and atraumatic.  Eyes:     Conjunctiva/sclera: Conjunctivae normal.  Cardiovascular:     Rate and Rhythm: Normal rate.  Pulmonary:     Effort: Pulmonary effort is normal.  Musculoskeletal:     Comments: Mild TTP to left lower back, no midline spine tenderness  Neurological:     Mental Status: She is alert.  Psychiatric:        Mood and Affect: Mood normal.        Behavior: Behavior normal.        Thought Content: Thought content normal.     UC Treatments / Results  Labs (all  labs ordered are listed, but only abnormal results are displayed) Labs Reviewed - No data to display  EKG   Radiology No results found.  Procedures Procedures (including critical care  time)  Medications Ordered in UC Medications - No data to display  Initial Impression / Assessment and Plan / UC Course  I have reviewed the triage vital signs and the nursing notes.  Pertinent labs & imaging results that were available during my care of the patient were reviewed by me and considered in my medical decision making (see chart for details).    Steroid and muscle relaxer prescribed for pain. Encouraged follow up if symptoms fail to improve or worsen.   Final Clinical Impressions(s) / UC Diagnoses   Final diagnoses:  Acute left-sided low back pain with left-sided sciatica   Discharge Instructions   None    ED Prescriptions     Medication Sig Dispense Auth. Provider   predniSONE (DELTASONE) 20 MG tablet Take 2 tablets (40 mg total) by mouth daily with breakfast for 5 days. 10 tablet Ewell Poe F, PA-C   cyclobenzaprine (FLEXERIL) 10 MG tablet Take 1 tablet (10 mg total) by mouth 2 (two) times daily as needed for muscle spasms. 20 tablet Francene Finders, PA-C      PDMP not reviewed this encounter.   Francene Finders, PA-C 02/10/21 [phone]

## 2021-02-20 NOTE — Progress Notes (Signed)
Erroneous encounter

## 2021-02-25 ENCOUNTER — Encounter: Payer: Managed Care, Other (non HMO) | Admitting: Family

## 2021-03-01 ENCOUNTER — Ambulatory Visit: Payer: Managed Care, Other (non HMO) | Admitting: Family

## 2021-03-30 ENCOUNTER — Encounter (HOSPITAL_BASED_OUTPATIENT_CLINIC_OR_DEPARTMENT_OTHER): Payer: Self-pay | Admitting: Emergency Medicine

## 2021-03-30 ENCOUNTER — Other Ambulatory Visit: Payer: Self-pay

## 2021-03-30 ENCOUNTER — Emergency Department (HOSPITAL_BASED_OUTPATIENT_CLINIC_OR_DEPARTMENT_OTHER)
Admission: EM | Admit: 2021-03-30 | Discharge: 2021-03-30 | Disposition: A | Discharge status: Home / Self Care | Payer: Managed Care, Other (non HMO) | Attending: Emergency Medicine | Admitting: Emergency Medicine

## 2021-03-30 DIAGNOSIS — M25521 Pain in right elbow: Secondary | ICD-10-CM

## 2021-03-30 MED ORDER — PREDNISONE 10 MG PO TABS: 40.0000 mg | ORAL_TABLET | Freq: Every day | ORAL | 0 refills | Status: DC

## 2021-03-30 NOTE — ED Provider Notes (Signed)
?[facility] ?Provider Note ? ? ?CSN: [id number] ?Arrival date & time: 03/30/21  7867 ? ?  ? ?History ? ?Chief Complaint  ?Patient presents with  ? R arm pain  ? ? ?Janice Terrell is a 46 y.o. female. ? ?The history is provided by the patient.  ?Janice Terrell is a 46 y.o. female who presents to the Emergency Department complaining of elbow pain.  She presents emergency department complaining of pain to the right elbow on the outer aspect that started 2 days ago.  She also does get some pain to the right upper arm over the bicep region when she flexes it.  No fevers, shortness of breath.  She has local swelling to the elbow.  She has a history of hypertension, no additional medical problems.  No history of gout or kidney disease.  She is right-hand dominant.  No injuries.  No repetitive motions at work. ?  ? ?Home Medications ?Prior to Admission medications   ?Medication Sig Start Date End Date Taking? Authorizing Provider  ?predniSONE (DELTASONE) 10 MG tablet Take 4 tablets (40 mg total) by mouth daily. 03/30/21  Yes Quintella Reichert, MD  ?acetaminophen (TYLENOL) 500 MG tablet Take 1,000 mg by mouth every 6 (six) hours as needed for headache or moderate pain.    [provider]  ?amLODipine (NORVASC) 5 MG tablet Take 1 tablet (5 mg total) by mouth daily. 02/02/21 05/03/21  Camillia Herter, NP  ?Ascorbic Acid (VITAMIN C) 1000 MG tablet Take 1,000 mg by mouth daily.    [provider]  ?cyclobenzaprine (FLEXERIL) 10 MG tablet Take 1 tablet (10 mg total) by mouth 2 (two) times daily as needed for muscle spasms. 02/09/21   Francene Finders, PA-C  ?Diclofenac Sodium 2 % SOLN Pen[place]aid 20 mg/gram/actuation (2 %) topical soln in metered-dose pump ? APPLY 1 PUMP (1 GRAM) TO AFFECTED AREA TOPICALLY TWICE DAILY AS DIRECTED    [provider]  ?meloxicam (MOBIC) 7.5 MG tablet meloxicam 7.5 mg tablet    [provider]  ?naproxen (NAPROSYN) 375 MG tablet Take 1  tablet (375 mg total) by mouth 2 (two) times daily with a meal. 07/03/20   Jaynee Eagles, PA-C  ?phentermine 37.5 MG capsule Take 1 capsule (37.5 mg total) by mouth every morning. 02/02/21 03/04/21  Camillia Herter, NP  ?   ? ?Allergies    ?Patient has no known allergies.   ? ?Review of Systems   ?Review of Systems  ?All other systems reviewed and are negative. ? ?Physical Exam ?Updated Vital Signs ?BP (!) 151/103 (BP Location: Left Arm)   Pulse 78   Temp 98.6 ?F (37 ?C) (Oral)   Resp 18   Wt 127 kg   LMP 08/09/2014   SpO2 100%   BMI 50.88 kg/m?  ?Physical Exam ?Vitals and nursing note reviewed.  ?Constitutional:   ?   Appearance: She is well-developed.  ?HENT:  ?   Head: Normocephalic and atraumatic.  ?Cardiovascular:  ?   Rate and Rhythm: Normal rate and regular rhythm.  ?Pulmonary:  ?   Effort: Pulmonary effort is normal. No respiratory distress.  ?Abdominal:  ?   Tenderness: There is no guarding.  ?Musculoskeletal:  ?   Comments: 2+ radial pulses bilaterally.  There is soft tissue swelling over the right outer elbow with fullness over the right olecranon bursa.  There is mild point tenderness over the right olecranon.  There is no overlying warmth or redness.  She is able to fully flex and extend at the elbow, fully ranges shoulder, wrist.  ?Skin: ?   General: Skin is warm and dry.  ?Neurological:  ?   Mental Status: She is alert and oriented to person, place, and time.  ?Psychiatric:     ?   Behavior: Behavior normal.  ? ? ?ED Results / Procedures / Treatments   ?Labs ?(all labs ordered are listed, but only abnormal results are displayed) ?Labs Reviewed - No data to display ? ?EKG ?None ? ?Radiology ?No results found. ? ?Procedures ?Procedures  ? ? ?Medications Ordered in ED ?Medications - No data to display ? ?ED Course/ Medical Decision Making/ A&P ?  ?                        ?Medical Decision Making ?Risk ?Prescription drug management. ? ? ?Patient is a right handed woman with history of hypertension here for  evaluation of atraumatic right elbow pain.  No evidence of soft tissue infection, DVT, septic arthritis or gouty arthritis on evaluation.  We will treat for bursitis with steroids, continuing NSAIDs at home.  Discussed outpatient follow-up and return precautions. ? ? ? ? ? ? ? ?Final Clinical Impression(s) / ED Diagnoses ?Final diagnoses:  ?Right elbow pain  ? ? ?Rx / DC Orders ?ED Discharge Orders   ? ?      Ordered  ?  predniSONE (DELTASONE) 10 MG tablet  Daily       ? 03/30/21 0643  ? ?  ?  ? ?  ? ? ?  ?Quintella Reichert, MD ?03/30/21 [phone] ? ?

## 2021-03-30 NOTE — ED Notes (Signed)
Patient verbalizes understanding of discharge instructions. Opportunity for questioning and answers were provided. Armband removed by staff, pt discharged from ED. Ambulated out to lobby  

## 2021-03-30 NOTE — ED Triage Notes (Signed)
Pt in with pain and swelling to R elbow and forearm x 2 days. Pt states she noticed the pain when she woke up one morning, and the pain and swelling have increased daily. No relief with muscle relaxers or ibuprofen. Denies any injury to arm  ?

## 2021-04-15 ENCOUNTER — Ambulatory Visit: Payer: Managed Care, Other (non HMO) | Admitting: Family

## 2021-04-15 DIAGNOSIS — M778 Other enthesopathies, not elsewhere classified: Secondary | ICD-10-CM | POA: Insufficient documentation

## 2021-04-15 DIAGNOSIS — M25521 Pain in right elbow: Secondary | ICD-10-CM | POA: Insufficient documentation

## 2021-04-28 ENCOUNTER — Encounter: Payer: Self-pay | Admitting: Family

## 2021-05-06 ENCOUNTER — Ambulatory Visit: Payer: Managed Care, Other (non HMO) | Admitting: Family

## 2021-05-28 ENCOUNTER — Encounter: Payer: Self-pay | Admitting: Emergency Medicine

## 2021-05-28 ENCOUNTER — Ambulatory Visit
Admission: EM | Admit: 2021-05-28 | Discharge: 2021-05-28 | Disposition: A | Discharge status: Home / Self Care | Payer: Managed Care, Other (non HMO) | Attending: Family Medicine | Admitting: Family Medicine

## 2021-05-28 ENCOUNTER — Other Ambulatory Visit: Payer: Self-pay

## 2021-05-28 ENCOUNTER — Ambulatory Visit (INDEPENDENT_AMBULATORY_CARE_PROVIDER_SITE_OTHER): Payer: Managed Care, Other (non HMO)

## 2021-05-28 DIAGNOSIS — M25561 Pain in right knee: Secondary | ICD-10-CM

## 2021-05-28 MED ORDER — MELOXICAM 15 MG PO TABS: 15.0000 mg | ORAL_TABLET | Freq: Every day | ORAL | 0 refills | Status: DC

## 2021-05-28 NOTE — Discharge Instructions (Signed)
Xrays showed some mild arthritis changes; no broken bones. ? ?Take meloxicam 15 mg 1 daily for pain. If you have taken this medicine, do not take ibuprofen or aleve on top of it. You may take tylenol with the meloxicam. ?

## 2021-05-28 NOTE — ED Triage Notes (Signed)
Pt here for right knee pain x 4 days; pt denies obvious injury  ?

## 2021-05-28 NOTE — ED Provider Notes (Addendum)
?Janice Terrell ? ? ? ?CSN: [id number] ?Arrival date & time: 05/28/21  1425 ? ? ?  ? ?History   ?Chief Complaint ?Chief Complaint  ?Patient presents with  ? Knee Pain  ? ? ?HPI ?Janice Terrell is a 46 y.o. female.  ? ? ?Knee Pain ?Here for right knee pain for 3-4 days. No trauma/fall/injury. No f/c. ?Feels swollen. ? ?Takes amlodipine for bp ? ?Has been taking 4 of the 200 mg ibuprofen and not helping much ? ?Past Medical History:  ?Diagnosis Date  ? Arthritis   ? Hypertension   ? [place] ([place] 04/27/2011  ? Obesity, Class III, BMI 40-49.9 (morbid obesity) ([place]) 04/27/2011  ? ? ?Patient Active Problem List  ? Diagnosis Date Noted  ? Bacterial vaginitis 09/28/2020  ? Candida vaginitis 09/28/2020  ? Prediabetes 09/27/2020  ? 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 Screening for malignant neoplasm of colon 09/24/2020  ? Abnormal uterine bleeding 08/06/2020  ? Abnormal vaginal bleeding 08/06/2020  ? Pain in pelvis 08/06/2020  ? Closed fracture of coronoid process of left ulna 07/20/2020  ? Closed fracture of head of left radius 07/20/2020  ? Primary osteoarthritis of left knee 02/21/2019  ? Cyst of left ovary 08/28/2018  ? Irregular periods 11/17/2014  ? Morbid obesity ([place]) 04/27/2011  ? ? ?Past Surgical History:  ?Procedure Laterality Date  ? ABDOMINAL HYSTERECTOMY    ? BREATH TEK H PYLORI  05/19/2011  ? Procedure: BREATH TEK H PYLORI;  Surgeon: Pedro Earls, MD;  Location: [facility];  Service: General;  Laterality: N/A;  ? [place]  ? COLONOSCOPY WITH PROPOFOL N/A 02/06/2020  ? Procedure: COLONOSCOPY WITH PROPOFOL;  Surgeon: Carol Ada, MD;  Location: [facility];  Service: Endoscopy;  Laterality: N/A;  ? CYSTOSCOPY  11/17/2014  ? Procedure: CYSTOSCOPY;  Surgeon: Crawford Givens, MD;  Location: [facility];  Service: Gynecology;;  ? LAPAROSCOPIC LYSIS OF ADHESIONS N/A 08/28/2018  ? Procedure: Laparoscopic Lysis Of Adhesions with repair of serosal tear of colon;  Surgeon:  Crawford Givens, MD;  Location: [facility];  Service: Gynecology;  Laterality: N/A;  ? POLYPECTOMY  02/06/2020  ? Procedure: POLYPECTOMY;  Surgeon: Carol Ada, MD;  Location: [facility];  Service: Endoscopy;;  ? TUBAL LIGATION    ? ? ?OB History   ?No obstetric history on file. ?  ? ? ? ?Home Medications   ? ?Prior to Admission medications   ?Medication Sig Start Date End Date Taking? Authorizing Provider  ?meloxicam (MOBIC) 15 MG tablet Take 1 tablet (15 mg total) by mouth daily. 05/28/21  Yes Barrett Henle, MD  ?acetaminophen (TYLENOL) 500 MG tablet Take 1,000 mg by mouth every 6 (six) hours as needed for headache or moderate pain.    [provider]  ?amLODipine (NORVASC) 5 MG tablet Take 1 tablet (5 mg total) by mouth daily. 02/02/21 05/03/21  Camillia Herter, NP  ?Ascorbic Acid (VITAMIN C) 1000 MG tablet Take 1,000 mg by mouth daily.    [provider]  ?phentermine 37.5 MG capsule Take 1 capsule (37.5 mg total) by mouth every morning. 02/02/21 03/04/21  Camillia Herter, NP  ? ? ?Family History ?Family History  ?Problem Relation Age of Onset  ? Diabetes Sister   ? Diabetes Maternal Grandmother   ? Diabetes Other   ? ? ?Social History ?Social History  ? ?Tobacco Use  ? Smoking status: Never  ? Smokeless tobacco: Never  ?Vaping Use  ? Vaping Use: Never  used  ?Substance Use Topics  ? Alcohol use: Yes  ?  Comment: occasionally  ? Drug use: No  ? ? ? ?Allergies   ?Patient has no known allergies. ? ? ?Review of Systems ?Review of Systems ? ? ?Physical Exam ?Triage Vital Signs ?ED Triage Vitals  ?Enc Vitals Group  ?   BP 05/28/21 1458 (!) 156/95  ?   Pulse Rate 05/28/21 1458 82  ?   Resp 05/28/21 1458 18  ?   Temp 05/28/21 1458 98.2 ?F (36.8 ?C)  ?   Temp Source 05/28/21 1458 Oral  ?   SpO2 05/28/21 1458 97 %  ?   Weight --   ?   Height --   ?   Head Circumference --   ?   Peak Flow --   ?   Pain Score 05/28/21 1425 6  ?   Pain Loc --   ?   Pain Edu? --   ?   Excl. in [place]? --   ? ?No data found. ? ?Updated  Vital Signs ?BP (!) 156/95 (BP Location: Left Arm)   Pulse 82   Temp 98.2 ?F (36.8 ?C) (Oral)   Resp 18   LMP 08/09/2014   SpO2 97%  ? ?Visual Acuity ?Right Eye Distance:   ?Left Eye Distance:   ?Bilateral Distance:   ? ?Right Eye Near:   ?Left Eye Near:    ?Bilateral Near:    ? ?Physical Exam ?Vitals reviewed.  ?Constitutional:   ?   General: She is not in acute distress. ?   Appearance: She is not toxic-appearing.  ?Musculoskeletal:     ?   General: Tenderness (right medial joint line of knee, small effusion.) present.  ?Skin: ?   Coloration: Skin is not jaundiced or pale.  ?Neurological:  ?   Mental Status: She is alert.  ? ? ? ?UC Treatments / Results  ?Labs ?(all labs ordered are listed, but only abnormal results are displayed) ?Labs Reviewed - No data to display ? ?EKG ? ? ?Radiology ?DG Knee 1-2 Views Right ? ?Result Date: 05/28/2021 ?CLINICAL DATA:  Pain x4 days EXAM: RIGHT KNEE - 2 VIEW COMPARISON:  Images of previous study done on 04/25/2018 are not available for comparison. FINDINGS: No recent fracture or dislocation is seen. There is no significant effusion. Minimal bony spurs noted in the lateral and patellofemoral compartments. IMPRESSION: No recent fracture or dislocation is seen in the right knee. Electronically Signed   By: Elmer Picker M.D.   On: 05/28/2021 15:38   ? ?Procedures ?Procedures (including critical care time) ? ?Medications Ordered in UC ?Medications - No data to display ? ?Initial Impression / Assessment and Plan / UC Course  ?I have reviewed the triage vital signs and the nursing notes. ? ?Pertinent labs & imaging results that were available during my care of the patient were reviewed by me and considered in my medical decision making (see chart for details). ? ?  ? ?Mild degenerative changes on xray. Will send meloxicam, and she will f/u with her pcp soon. ?Final Clinical Impressions(s) / UC Diagnoses  ? ?Final diagnoses:  ?Acute pain of right knee  ? ? ? ?Discharge  Instructions   ? ?  ?Xrays showed some mild arthritis changes; no broken bones. ? ?Take meloxicam 15 mg 1 daily for pain. If you have taken this medicine, do not take ibuprofen or aleve on top of it. You may take tylenol with the meloxicam. ? ? ? ? ?  ED Prescriptions   ? ? Medication Sig Dispense Auth. Provider  ? meloxicam (MOBIC) 15 MG tablet Take 1 tablet (15 mg total) by mouth daily. 30 tablet Barrett Henle, MD  ? ?  ? ?I have reviewed the PDMP during this encounter. ?  ?Barrett Henle, MD ?05/28/21 1549 ? ?  ?Barrett Henle, MD ?05/28/21 1550 ? ?

## 2021-06-13 ENCOUNTER — Ambulatory Visit
Admission: EM | Admit: 2021-06-13 | Discharge: 2021-06-13 | Disposition: A | Discharge status: Home / Self Care | Payer: Managed Care, Other (non HMO) | Attending: Family Medicine | Admitting: Family Medicine

## 2021-06-13 DIAGNOSIS — M25561 Pain in right knee: Secondary | ICD-10-CM | POA: Diagnosis not present

## 2021-06-13 DIAGNOSIS — M545 Low back pain, unspecified: Secondary | ICD-10-CM | POA: Diagnosis not present

## 2021-06-13 MED ORDER — PREDNISONE 20 MG PO TABS: ORAL_TABLET | ORAL | 0 refills | Status: DC

## 2021-06-13 NOTE — Discharge Instructions (Signed)
Take prednisone 20 mg--3 tabs daily x3 days, then 2 tabs daily x3 days, then 1 tab daily x3 days, then one half tab daily x3 days, then stop  ? ?There is an orthopedist named Dr. Oretha Caprice [facility].  ?

## 2021-06-13 NOTE — ED Triage Notes (Signed)
Patient presents to Urgent Care with complaints of L hip pain and right knee pain since last night 10/10 in [place]. Patient reports last time she had a flair up the muscle relaxer did not help but the prednisone did.  ? ?

## 2021-06-13 NOTE — ED Provider Notes (Signed)
?[facility] ? ? ? ?CSN: [id number] ?Arrival date & time: 06/13/21  6948 ? ? ?  ? ?History   ?Chief Complaint ?Chief Complaint  ?Patient presents with  ? Hip Pain  ? ? ?HPI ?Janice Terrell is a 46 y.o. female.  ? ? ?Hip Pain ? ?Here for continued right knee pain.  She states that the only thing that helps it is a cortisone shot and her last one was done with an orthopedist somewhere in [facility] about a year ago or more. ? ?Also is having new pain again in her left low back.  It is not radiating at this point.  It worsens on arising to a standing position and on walking. ? ?No fever or chills or dysuria or hematuria. No trauma or fall recently. ? ?Past Medical History:  ?Diagnosis Date  ? Arthritis   ? Hypertension   ? Morbid obesity ([place]) 04/27/2011  ? Obesity, Class III, BMI 40-49.9 (morbid obesity) ([place]) 04/27/2011  ? ? ?Patient Active Problem List  ? Diagnosis Date Noted  ? Bacterial vaginitis 09/28/2020  ? Candida vaginitis 09/28/2020  ? Prediabetes 09/27/2020  ? 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 Screening for malignant neoplasm of colon 09/24/2020  ? Abnormal uterine bleeding 08/06/2020  ? Abnormal vaginal bleeding 08/06/2020  ? Pain in pelvis 08/06/2020  ? Closed fracture of coronoid process of left ulna 07/20/2020  ? Closed fracture of head of left radius 07/20/2020  ? Primary osteoarthritis of left knee 02/21/2019  ? Cyst of left ovary 08/28/2018  ? Irregular periods 11/17/2014  ? Morbid obesity ([place]) 04/27/2011  ? ? ?Past Surgical History:  ?Procedure Laterality Date  ? ABDOMINAL HYSTERECTOMY    ? BREATH TEK H PYLORI  05/19/2011  ? Procedure: BREATH TEK H PYLORI;  Surgeon: Pedro Earls, MD;  Location: [facility];  Service: General;  Laterality: N/A;  ? [place]  ? COLONOSCOPY WITH PROPOFOL N/A 02/06/2020  ? Procedure: COLONOSCOPY WITH PROPOFOL;  Surgeon: Carol Ada, MD;  Location: [facility];  Service: Endoscopy;  Laterality: N/A;  ? CYSTOSCOPY  11/17/2014   ? Procedure: CYSTOSCOPY;  Surgeon: Crawford Givens, MD;  Location: [facility];  Service: Gynecology;;  ? LAPAROSCOPIC LYSIS OF ADHESIONS N/A 08/28/2018  ? Procedure: Laparoscopic Lysis Of Adhesions with repair of serosal tear of colon;  Surgeon: Crawford Givens, MD;  Location: [facility];  Service: Gynecology;  Laterality: N/A;  ? POLYPECTOMY  02/06/2020  ? Procedure: POLYPECTOMY;  Surgeon: Carol Ada, MD;  Location: [facility];  Service: Endoscopy;;  ? TUBAL LIGATION    ? ? ?OB History   ?No obstetric history on file. ?  ? ? ? ?Home Medications   ? ?Prior to Admission medications   ?Medication Sig Start Date End Date Taking? Authorizing Provider  ?predniSONE (DELTASONE) 20 MG tablet 3 tabs daily x3 days, then 2 tabs daily x3 days, then 1 tab daily x3 days, then one half tab daily x3 days, then stop 06/13/21  Yes Patsey Pitstick, Gwenlyn Perking, MD  ?acetaminophen (TYLENOL) 500 MG tablet Take 1,000 mg by mouth every 6 (six) hours as needed for headache or moderate pain.    [provider]  ?amLODipine (NORVASC) 5 MG tablet Take 1 tablet (5 mg total) by mouth daily. 02/02/21 05/03/21  Camillia Herter, NP  ?Ascorbic Acid (VITAMIN C) 1000 MG tablet Take 1,000 mg by mouth daily.    [provider]  ?phentermine 37.5 MG capsule Take 1 capsule (37.5 mg  total) by mouth every morning. 02/02/21 03/04/21  Camillia Herter, NP  ? ? ?Family History ?Family History  ?Problem Relation Age of Onset  ? Diabetes Sister   ? Diabetes Maternal Grandmother   ? Diabetes Other   ? ? ?Social History ?Social History  ? ?Tobacco Use  ? Smoking status: Never  ? Smokeless tobacco: Never  ?Vaping Use  ? Vaping Use: Never used  ?Substance Use Topics  ? Alcohol use: Yes  ?  Comment: occasionally  ? Drug use: No  ? ? ? ?Allergies   ?Patient has no known allergies. ? ? ?Review of Systems ?Review of Systems ? ? ?Physical Exam ?Triage Vital Signs ?ED Triage Vitals  ?Enc Vitals Group  ?   BP 06/13/21 1002 122/83  ?   Pulse Rate 06/13/21 1002 82  ?   Resp 06/13/21  1002 18  ?   Temp 06/13/21 1002 98.1 ?F (36.7 ?C)  ?   Temp Source 06/13/21 1002 Oral  ?   SpO2 06/13/21 1002 98 %  ?   Weight --   ?   Height --   ?   Head Circumference --   ?   Peak Flow --   ?   Pain Score 06/13/21 1000 10  ?   Pain Loc --   ?   Pain Edu? --   ?   Excl. in [place]? --   ? ?No data found. ? ?Updated Vital Signs ?BP 122/83   Pulse 82   Temp 98.1 ?F (36.7 ?C) (Oral)   Resp 18   LMP 08/09/2014   SpO2 98%  ? ?Visual Acuity ?Right Eye Distance:   ?Left Eye Distance:   ?Bilateral Distance:   ? ?Right Eye Near:   ?Left Eye Near:    ?Bilateral Near:    ? ?Physical Exam ?Vitals reviewed.  ?Constitutional:   ?   General: She is not in acute distress. ?   Appearance: She is not toxic-appearing.  ?Cardiovascular:  ?   Rate and Rhythm: Normal rate and regular rhythm.  ?Pulmonary:  ?   Effort: Pulmonary effort is normal.  ?   Breath sounds: Normal breath sounds.  ?Musculoskeletal:  ?   Comments: Some tenderness over the left sacral area.  She is also tender along the medial joint line of her left  ?Neurological:  ?   Mental Status: She is alert.  ? ? ? ?UC Treatments / Results  ?Labs ?(all labs ordered are listed, but only abnormal results are displayed) ?Labs Reviewed - No data to display ? ?EKG ? ? ?Radiology ?No results found. ? ?Procedures ?Procedures (including critical care time) ? ?Medications Ordered in UC ?Medications - No data to display ? ?Initial Impression / Assessment and Plan / UC Course  ?I have reviewed the triage vital signs and the nursing notes. ? ?Pertinent labs & imaging results that were available during my care of the patient were reviewed by me and considered in my medical decision making (see chart for details). ? ?  ? ?She feels that the prednisone in January helped her a lot, so I am going to send in a prednisone taper for her. ? ?Discussed her getting back in with the orthopedist has seen her already. ?Final Clinical Impressions(s) / UC Diagnoses  ? ?Final diagnoses:  ?Acute pain  of right knee  ?Acute right-sided low back pain without sciatica  ? ? ? ?Discharge Instructions   ? ?  ?Take prednisone 20 mg--3 tabs daily  x3 days, then 2 tabs daily x3 days, then 1 tab daily x3 days, then one half tab daily x3 days, then stop  ? ?There is an orthopedist named Dr. Oretha Caprice [facility].  ? ? ? ? ?ED Prescriptions   ? ? Medication Sig Dispense Auth. Provider  ? predniSONE (DELTASONE) 20 MG tablet 3 tabs daily x3 days, then 2 tabs daily x3 days, then 1 tab daily x3 days, then one half tab daily x3 days, then stop 20 tablet Windy Carina, Gwenlyn Perking, MD  ? ?  ? ?I have reviewed the PDMP during this encounter. ?  ?Barrett Henle, MD ?06/13/21 1034 ? ?

## 2021-06-15 ENCOUNTER — Encounter: Payer: Self-pay | Admitting: Family Medicine

## 2021-06-15 ENCOUNTER — Ambulatory Visit (INDEPENDENT_AMBULATORY_CARE_PROVIDER_SITE_OTHER): Payer: Managed Care, Other (non HMO) | Admitting: Family Medicine

## 2021-06-15 ENCOUNTER — Ambulatory Visit: Payer: Self-pay

## 2021-06-15 VITALS — BP 148/84 | Ht 62.21 in | Wt 280.0 lb

## 2021-06-15 DIAGNOSIS — M25552 Pain in left hip: Secondary | ICD-10-CM | POA: Diagnosis not present

## 2021-06-15 DIAGNOSIS — M23203 Derangement of unspecified medial meniscus due to old tear or injury, right knee: Secondary | ICD-10-CM | POA: Diagnosis not present

## 2021-06-15 MED ORDER — METHYLPREDNISOLONE ACETATE 40 MG/ML IJ SUSP
40.0000 mg | Freq: Once | INTRAMUSCULAR | Status: AC
  Administered 2021-06-15: 40 mg via INTRA_ARTICULAR

## 2021-06-15 MED ORDER — HYDROCODONE-ACETAMINOPHEN 5-325 MG PO TABS: 1.0000 | ORAL_TABLET | Freq: Three times a day (TID) | ORAL | 0 refills | Status: DC | PRN

## 2021-06-15 NOTE — Patient Instructions (Signed)
Good to see you ?Please use ice as needed. ?Please try the exercises. ?Please stop the prednisone. ?Please use the pain medicine at night as needed ?Please send me a message in MyChart with any questions or updates.  ?Please see me back in 3-4 weeks.  ? ?--Dr. Raeford Razor ? ?

## 2021-06-15 NOTE — Assessment & Plan Note (Signed)
Acutely occurring.  Symptoms seem more consistent with greater trochanteric bursitis.  Seems less likely for radicular pain at this time. ?-Counseled on home exercise therapy and supportive care. ?-Injection today. ?-Could consider further imaging or physical therapy. ?

## 2021-06-15 NOTE — Progress Notes (Signed)
?Janice Terrell - 46 y.o. female MRN [id number]  Date of birth: Sep 30, 1975 ? ?SUBJECTIVE:  Including CC & ROS.  ?No chief complaint on file. ? ? ?Janice Terrell is a 46 y.o. female that is presenting with acute right knee pain and acute left hip pain.  The knee pain is occurring over the medial aspect.  Left hip is occurring over the lateral hip and radiates down to the left lateral knee.  Pain is worse with getting up from a seated position. ? ?Review of the emergency department note from 4/29 shows she was provided meloxicam. ?Independent review of the emergency department note from 5/15 shows she was provided prednisone. ?Independent review of the right knee x-ray from 4/29 shows mild medial joint space narrowing. ? ?Review of Systems ?See HPI  ? ?HISTORY: Past Medical, Surgical, Social, and Family History Reviewed & Updated per EMR.   ?Pertinent Historical Findings include: ? ?Past Medical History:  ?Diagnosis Date  ? Arthritis   ? Hypertension   ? Morbid obesity ([place]) 04/27/2011  ? Obesity, Class III, BMI 40-49.9 (morbid obesity) ([place]) 04/27/2011  ? ? ?Past Surgical History:  ?Procedure Laterality Date  ? ABDOMINAL HYSTERECTOMY    ? BREATH TEK H PYLORI  05/19/2011  ? Procedure: BREATH TEK H PYLORI;  Surgeon: Pedro Earls, MD;  Location: [facility];  Service: General;  Laterality: N/A;  ? [place]  ? COLONOSCOPY WITH PROPOFOL N/A 02/06/2020  ? Procedure: COLONOSCOPY WITH PROPOFOL;  Surgeon: Carol Ada, MD;  Location: [facility];  Service: Endoscopy;  Laterality: N/A;  ? CYSTOSCOPY  11/17/2014  ? Procedure: CYSTOSCOPY;  Surgeon: Crawford Givens, MD;  Location: [facility];  Service: Gynecology;;  ? LAPAROSCOPIC LYSIS OF ADHESIONS N/A 08/28/2018  ? Procedure: Laparoscopic Lysis Of Adhesions with repair of serosal tear of colon;  Surgeon: Crawford Givens, MD;  Location: [facility];  Service: Gynecology;  Laterality: N/A;  ? POLYPECTOMY  02/06/2020  ? Procedure: POLYPECTOMY;  Surgeon: Carol Ada,  MD;  Location: [facility];  Service: Endoscopy;;  ? TUBAL LIGATION    ? ? ? ?PHYSICAL EXAM:  ?VS: BP (!) 148/84 (BP Location: Left Arm, Patient Position: Sitting)   Ht 5' 2.21" (1.58 m)   Wt 280 lb (127 kg)   LMP 08/09/2014   BMI 50.87 kg/m?  ?Physical Exam ?Gen: NAD, alert, cooperative with exam, well-appearing ?MSK:  ?Neurovascularly intact   ? ? ?Aspiration/Injection Procedure Note ?Janice Hait Bluford Kaufmann ?07-16-1975 ? ?Procedure: Injection ?Indications: Left hip pain ? ?Procedure Details ?Consent: Risks of procedure as well as the alternatives and risks of each were explained to the (patient/caregiver).  Consent for procedure obtained. ?Time Out: Verified patient identification, verified procedure, site/side was marked, verified correct patient position, special equipment/implants available, medications/allergies/relevent history reviewed, required imaging and test results available.  Performed.  The area was cleaned with iodine and alcohol swabs.   ? ?The left greater trochanteric bursa was injected using 1 cc's of 40 mg Depo-Medrol and 4 cc's of 0.25% bupivacaine with a 22 gauge 3 1/2" needle.  Ultrasound was used. Images were obtained in short views showing the injection.   ? ? ?A sterile dressing was applied. ? ?Patient did tolerate procedure well. ? ? ? ? ?ASSESSMENT & PLAN:  ? ?Greater trochanteric pain syndrome of left lower extremity ?Acutely occurring.  Symptoms seem more consistent with greater trochanteric bursitis.  Seems less likely for radicular pain at this time. ?-Counseled on home exercise therapy and supportive care. ?-Injection today. ?-  Could consider further imaging or physical therapy. ? ?Degenerative tear of medial meniscus of right knee ?Acutely occurring.  X-ray showing minimal degenerative change.  Likely more associated with the meniscus. ?-Counseled on home exercise therapy and supportive care. ?-Stop prednisone at this time. ?-Counseled on Voltaren. ?-Could consider injection or  physical therapy. ? ? ? ? ?

## 2021-06-15 NOTE — Assessment & Plan Note (Signed)
Acutely occurring.  X-ray showing minimal degenerative change.  Likely more associated with the meniscus. ?-Counseled on home exercise therapy and supportive care. ?-Stop prednisone at this time. ?-Counseled on Voltaren. ?-Could consider injection or physical therapy. ?

## 2021-06-19 NOTE — Progress Notes (Signed)
Patient ID: Janice Terrell, female    DOB: 08/10/1975  MRN: [id number]  CC: Hypertension Follow-Up  Subjective: Janice Terrell is a 46 y.o. female who presents for hypertension follow-up.   Her concerns today include:  Hypertension follow-up: 02/02/2021: - Continue Amlodipine as prescribed.   06/24/2021: Doing well on current regimen. No side effects. No issues/concerns. Denies chest pain, shortness of breath, worst headache of life and additional red flag symptoms. Home blood pressures similar to today's reading.   2. Weight management: Ready to resume Phentermine. No issues/concerns.    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 06/15/2021   Degenerative tear of medial meniscus of right knee 06/15/2021   Pain in joint of right elbow 04/15/2021   Triceps tendinitis 04/15/2021   Bacterial vaginitis 09/28/2020   Candida vaginitis 09/28/2020   Prediabetes 09/27/2020   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/24/2020   Screening for malignant neoplasm of colon 09/24/2020   Abnormal uterine bleeding 08/06/2020   Abnormal vaginal bleeding 08/06/2020   Pain in pelvis 08/06/2020   Closed fracture of coronoid process of left ulna 07/20/2020   Closed fracture of head of left radius 07/20/2020   Primary osteoarthritis of left knee 02/21/2019   Cyst of left ovary 08/28/2018   Irregular periods 11/17/2014   Morbid obesity ([place]) 04/27/2011     Current Outpatient Medications on File Prior to Visit  Medication Sig Dispense Refill   acetaminophen (TYLENOL) 500 MG tablet Take 1,000 mg by mouth every 6 (six) hours as needed for headache or moderate pain.     Ascorbic Acid (VITAMIN C) 1000 MG tablet Take 1,000 mg by mouth daily.     No current facility-administered medications on file prior to visit.    No Known Allergies  Social History   Socioeconomic History   Marital status: Married    Spouse name: Not on file    Number of children: Not on file   Years of education: Not on file   Highest education level: Not on file  Occupational History   Not on file  Tobacco Use   Smoking status: Never    Passive exposure: Never   Smokeless tobacco: Never  Vaping Use   Vaping Use: Never used  Substance and Sexual Activity   Alcohol use: Yes    Comment: occasionally   Drug use: No   Sexual activity: Not on file  Other Topics Concern   Not on file  Social History Narrative   Not on file   Social Determinants of Health   Financial Resource Strain: Not on file  Food Insecurity: Not on file  Transportation Needs: Not on file  Physical Activity: Not on file  Stress: Not on file  Social Connections: Not on file  Intimate Partner Violence: Not on file    Family History  Problem Relation Age of Onset   Diabetes Sister    Diabetes Maternal Grandmother    Diabetes Other     Past Surgical History:  Procedure Laterality Date   ABDOMINAL HYSTERECTOMY     BREATH TEK H PYLORI  05/19/2011   Procedure: [place];  Surgeon: Pedro Earls, MD;  Location: [facility];  Service: General;  Laterality: N/A;   [place]   COLONOSCOPY WITH PROPOFOL N/A 02/06/2020   Procedure: COLONOSCOPY WITH PROPOFOL;  Surgeon: Carol Ada, MD;  Location: [facility];  Service: Endoscopy;  Laterality: N/A;  CYSTOSCOPY  11/17/2014   Procedure: CYSTOSCOPY;  Surgeon: Crawford Givens, MD;  Location: [facility];  Service: Gynecology;;   LAPAROSCOPIC LYSIS OF ADHESIONS N/A 08/28/2018   Procedure: Laparoscopic Lysis Of Adhesions with repair of serosal tear of colon;  Surgeon: Crawford Givens, MD;  Location: [facility];  Service: Gynecology;  Laterality: N/A;   POLYPECTOMY  02/06/2020   Procedure: POLYPECTOMY;  Surgeon: Carol Ada, MD;  Location: [facility];  Service: Endoscopy;;   TUBAL LIGATION      ROS: Review of Systems Negative except as stated above  PHYSICAL EXAM: BP 121/78 (BP Location: Left Arm,  Patient Position: Sitting, Cuff Size: Large)   Pulse 82   Temp 98.3 F (36.8 C)   Resp 18   Ht 4' 11.06" (1.5 m)   Wt 284 lb (128.8 kg)   LMP 08/09/2014   SpO2 97%   BMI 57.25 kg/m   Physical Exam HENT:     Head: Normocephalic and atraumatic.  Eyes:     Extraocular Movements: Extraocular movements intact.     Conjunctiva/sclera: Conjunctivae normal.     Pupils: Pupils are equal, round, and reactive to light.  Cardiovascular:     Rate and Rhythm: Normal rate and regular rhythm.     Pulses: Normal pulses.     Heart sounds: Normal heart sounds.  Pulmonary:     Effort: Pulmonary effort is normal.     Breath sounds: Normal breath sounds.  Musculoskeletal:     Cervical back: Normal range of motion and neck supple.  Neurological:     General: No focal deficit present.     Mental Status: She is alert and oriented to person, place, and time.  Psychiatric:        Mood and Affect: Mood normal.        Behavior: Behavior normal.   ASSESSMENT AND PLAN: 1. Primary hypertension: - Continue Amlodipine as prescribed.  - Counseled on blood pressure goal of less than 130/80, low-sodium, DASH diet, medication compliance, and 150 minutes of moderate intensity exercise per week as tolerated. Counseled on medication adherence and adverse effects. - Follow-up with primary provider in 4 months or sooner if needed.  - amLODipine (NORVASC) 5 MG tablet; Take 1 tablet (5 mg total) by mouth daily.  Dispense: 120 tablet; Refill: 0  2. Encounter for weight management: - Phentermine as prescribed.  - Counseled on medication adherence and adverse effects. Patient verbalized understanding. - Counseled weight loss goal of 5% within 12 weeks or recommendation to discontinue regimen. Also, counseled on the importance of weight checks every 4 weeks. Patient verbalized understanding. - Counseled on low-sodium heart healthy diet and 150 minutes of moderate intensity exercise per week as tolerated.  - Follow-up  with primary provider in 4 weeks or sooner if needed.  - phentermine 15 MG capsule; Take 1 capsule (15 mg total) by mouth every morning.  Dispense: 30 capsule; Refill: 0   Patient was given the opportunity to ask questions.  Patient verbalized understanding of the plan and was able to repeat key elements of the plan. Patient was given clear instructions to go to Emergency Department or return to medical center if symptoms don't improve, worsen, or new problems develop.The patient verbalized understanding.   Requested Prescriptions   Signed Prescriptions Disp Refills   phentermine 15 MG capsule 30 capsule 0    Sig: Take 1 capsule (15 mg total) by mouth every morning.   amLODipine (NORVASC) 5 MG tablet 120 tablet 0    Sig:  Take 1 tablet (5 mg total) by mouth daily.    Return in about 4 months (around 10/25/2021) for Follow-Up or next available HTN, 4 weeks weight check.  Camillia Herter, NP

## 2021-06-24 ENCOUNTER — Ambulatory Visit (INDEPENDENT_AMBULATORY_CARE_PROVIDER_SITE_OTHER): Payer: Managed Care, Other (non HMO) | Admitting: Family

## 2021-06-24 ENCOUNTER — Encounter: Payer: Self-pay | Admitting: Family

## 2021-06-24 VITALS — BP 121/78 | HR 82 | Temp 98.3°F | Resp 18 | Ht 59.06 in | Wt 284.0 lb

## 2021-06-24 DIAGNOSIS — I10 Essential (primary) hypertension: Secondary | ICD-10-CM

## 2021-06-24 DIAGNOSIS — Z7689 Persons encountering health services in other specified circumstances: Secondary | ICD-10-CM | POA: Diagnosis not present

## 2021-06-24 MED ORDER — AMLODIPINE BESYLATE 5 MG PO TABS: 5.0000 mg | ORAL_TABLET | Freq: Every day | ORAL | 0 refills | Status: DC

## 2021-06-24 MED ORDER — PHENTERMINE HCL 15 MG PO CAPS
15.0000 mg | ORAL_CAPSULE | ORAL | 0 refills | Status: DC
Start: 2021-06-24 — End: 2022-02-10

## 2021-06-24 NOTE — Progress Notes (Signed)
Pt presents for hypertension follow-up and phentermine refill, pt states has been out for month due to appt rescheduled on 04/07 due to ofc being closed

## 2021-07-14 ENCOUNTER — Ambulatory Visit: Payer: Managed Care, Other (non HMO) | Admitting: Family Medicine

## 2021-08-01 ENCOUNTER — Ambulatory Visit
Admission: EM | Admit: 2021-08-01 | Discharge: 2021-08-01 | Disposition: A | Discharge status: Home / Self Care | Payer: Managed Care, Other (non HMO) | Attending: Emergency Medicine | Admitting: Emergency Medicine

## 2021-08-01 DIAGNOSIS — L500 Allergic urticaria: Secondary | ICD-10-CM | POA: Diagnosis not present

## 2021-08-01 MED ORDER — TRIAMCINOLONE ACETONIDE 0.1 % EX CREA: 1.0000 | TOPICAL_CREAM | Freq: Two times a day (BID) | CUTANEOUS | 0 refills | Status: DC

## 2021-08-01 MED ORDER — METHYLPREDNISOLONE 4 MG PO TBPK: ORAL_TABLET | ORAL | 0 refills | Status: DC

## 2021-08-01 NOTE — Discharge Instructions (Signed)
Please begin methylprednisolone, 1 row of tablets per day, tomorrow morning with your breakfast meal.  Please also begin triamcinolone cream apply to affected areas twice daily.  You do not have relief of your symptoms after completing the methylprednisolone tablets, please follow-up with your primary care provider to discuss possible referral to either dermatology or allergy and immunology.  Thank you for visiting urgent care today.

## 2021-08-01 NOTE — ED Provider Notes (Signed)
[facility]    CSN: [id number] Arrival date & time: 08/01/21  1421    HISTORY   Chief Complaint  Patient presents with   Rash   HPI Janice Terrell is a 46 y.o. female. Patient complains of a rash that appeared on both of her forearms a little over a week ago.  Patient denies new detergents, changes in diet, recent sun exposure, recent water exposure, new personal hygiene products.  Patient states the rash is very itchy, itches more when she scratches it.  Patient states she tried taking Benadryl and using topical hydrocortisone 10 with no relief.  Patient states she is never had a similar rash in the past.    Past Medical History:  Diagnosis Date   Arthritis    Hypertension    Morbid obesity ([place]) 04/27/2011   Obesity, Class III, BMI 40-49.9 (morbid obesity) ([place]) 04/27/2011   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 06/15/2021   Degenerative tear of medial meniscus of right knee 06/15/2021   Pain in joint of right elbow 04/15/2021   Triceps tendinitis 04/15/2021   Bacterial vaginitis 09/28/2020   Candida vaginitis 09/28/2020   Prediabetes 09/27/2020   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/24/2020   Screening for malignant neoplasm of colon 09/24/2020   Abnormal uterine bleeding 08/06/2020   Abnormal vaginal bleeding 08/06/2020   Pain in pelvis 08/06/2020   Closed fracture of coronoid process of left ulna 07/20/2020   Closed fracture of head of left radius 07/20/2020   Primary osteoarthritis of left knee 02/21/2019   Cyst of left ovary 08/28/2018   Irregular periods 11/17/2014   Morbid obesity ([place]) 04/27/2011   Past Surgical History:  Procedure Laterality Date   ABDOMINAL HYSTERECTOMY     BREATH TEK H PYLORI  05/19/2011   Procedure: BREATH TEK H PYLORI;  Surgeon: Pedro Earls, MD;  Location: [facility];  Service: General;  Laterality: N/A;   [place]    COLONOSCOPY WITH PROPOFOL N/A 02/06/2020   Procedure: COLONOSCOPY WITH PROPOFOL;  Surgeon: Carol Ada, MD;  Location: [facility];  Service: Endoscopy;  Laterality: N/A;   CYSTOSCOPY  11/17/2014   Procedure: CYSTOSCOPY;  Surgeon: Crawford Givens, MD;  Location: [facility];  Service: Gynecology;;   LAPAROSCOPIC LYSIS OF ADHESIONS N/A 08/28/2018   Procedure: Laparoscopic Lysis Of Adhesions with repair of serosal tear of colon;  Surgeon: Crawford Givens, MD;  Location: [facility];  Service: Gynecology;  Laterality: N/A;   POLYPECTOMY  02/06/2020   Procedure: POLYPECTOMY;  Surgeon: Carol Ada, MD;  Location: [facility];  Service: Endoscopy;;   TUBAL LIGATION     OB History   No obstetric history on file.    Home Medications    Prior to Admission medications   Medication Sig Start Date End Date Taking? Authorizing Provider  acetaminophen (TYLENOL) 500 MG tablet Take 1,000 mg by mouth every 6 (six) hours as needed for headache or moderate pain.    [provider]  amLODipine (NORVASC) 5 MG tablet Take 1 tablet (5 mg total) by mouth daily. 06/24/21 10/22/21  Camillia Herter, NP  Ascorbic Acid (VITAMIN C) 1000 MG tablet Take 1,000 mg by mouth daily.    [provider]  phentermine 15 MG capsule Take 1 capsule (15 mg total) by mouth every morning. 06/24/21   Camillia Herter, NP    Family History Family History  Problem Relation Age of Onset  Diabetes Sister    Diabetes Maternal Grandmother    Diabetes Other    Social History Social History   Tobacco Use   Smoking status: Never    Passive exposure: Never   Smokeless tobacco: Never  Vaping Use   Vaping Use: Never used  Substance Use Topics   Alcohol use: Yes    Comment: occasionally   Drug use: No   Allergies   Patient has no known allergies.  Review of Systems Review of Systems Pertinent findings noted in history of present illness.   Physical Exam Triage Vital Signs ED Triage Vitals  Enc Vitals Group     BP  11/26/20 0827 (!) 147/82     Pulse Rate 11/26/20 0827 72     Resp 11/26/20 0827 18     Temp 11/26/20 0827 98.3 F (36.8 C)     Temp Source 11/26/20 0827 Oral     SpO2 11/26/20 0827 98 %     Weight --      Height --      Head Circumference --      Peak Flow --      Pain Score 11/26/20 0826 5     Pain Loc --      Pain Edu? --      Excl. in [place]? --   No data found.  Updated Vital Signs BP 125/84   Pulse 73   Temp 98.5 F (36.9 C)   Resp 18   LMP 08/09/2014   SpO2 98%   Physical Exam Vitals and nursing note reviewed.  Constitutional:      General: She is not in acute distress.    Appearance: Normal appearance.  HENT:     Head: Normocephalic and atraumatic.  Eyes:     Pupils: Pupils are equal, round, and reactive to light.  Cardiovascular:     Rate and Rhythm: Normal rate and regular rhythm.  Pulmonary:     Effort: Pulmonary effort is normal.     Breath sounds: Normal breath sounds.  Musculoskeletal:        General: Normal range of motion.     Cervical back: Normal range of motion and neck supple.  Skin:    General: Skin is warm and dry.     Findings: Rash present. Rash is urticarial.     Comments: Both forearms  Neurological:     General: No focal deficit present.     Mental Status: She is alert and oriented to person, place, and time. Mental status is at baseline.  Psychiatric:        Mood and Affect: Mood normal.        Behavior: Behavior normal.        Thought Content: Thought content normal.        Judgment: Judgment normal.     Visual Acuity Right Eye Distance:   Left Eye Distance:   Bilateral Distance:    Right Eye Near:   Left Eye Near:    Bilateral Near:     UC Couse / Diagnostics / Procedures:    EKG  Radiology No results found.  Procedures Procedures (including critical care time)  UC Diagnoses / Final Clinical Impressions(s)   I have reviewed the triage vital signs and the nursing notes.  Pertinent labs & imaging results that were  available during my care of the patient were reviewed by me and considered in my medical decision making (see chart for details).    Final diagnoses:  Allergic urticaria  Urticarial rash, etiology unknown known.  Patient provided with oral dose of methylprednisolone along with topical steroid.  Patient advised to follow-up with primary care if not resolved.  ED Prescriptions     Medication Sig Dispense Auth. Provider   methylPREDNISolone (MEDROL DOSEPAK) 4 MG TBPK tablet Take 24 mg on day 1, 20 mg on day 2, 16 mg on day 3, 12 mg on day 4, 8 mg on day 5, 4 mg on day 6.  Take all tablets in each row at once, do not spread tablets out throughout the day. 21 tablet Lynden Oxford Scales, PA-C   triamcinolone cream (KENALOG) 0.1 % Apply 1 Application topically 2 (two) times daily. Apply to affected area(s) twice daily , do not apply to face. 80 g Lynden Oxford Scales, PA-C      PDMP not reviewed this encounter.  Pending results:  Labs Reviewed - No data to display  Medications Ordered in UC: Medications - No data to display  Disposition Upon Discharge:  Condition: stable for discharge home Home: take medications as prescribed; routine discharge instructions as discussed; follow up as advised.  Patient presented with an acute illness with associated systemic symptoms and significant discomfort requiring urgent management. In my opinion, this is a condition that a prudent lay person (someone who possesses an average knowledge of health and medicine) may potentially expect to result in complications if not addressed urgently such as respiratory distress, impairment of bodily function or dysfunction of bodily organs.   Routine symptom specific, illness specific and/or disease specific instructions were discussed with the patient and/or caregiver at length.   As such, the patient has been evaluated and assessed, work-up was performed and treatment was provided in alignment with urgent care  protocols and evidence based medicine.  Patient/parent/caregiver has been advised that the patient may require follow up for further testing and treatment if the symptoms continue in spite of treatment, as clinically indicated and appropriate.  Patient/parent/caregiver has been advised to return to the [facility] or PCP if no better; to PCP or the Emergency Department if new signs and symptoms develop, or if the current signs or symptoms continue to change or worsen for further workup, evaluation and treatment as clinically indicated and appropriate  The patient will follow up with their current PCP if and as advised. If the patient does not currently have a PCP we will assist them in obtaining one.   The patient may need specialty follow up if the symptoms continue, in spite of conservative treatment and management, for further workup, evaluation, consultation and treatment as clinically indicated and appropriate.   Patient/parent/caregiver verbalized understanding and agreement of plan as discussed.  All questions were addressed during visit.  Please see discharge instructions below for further details of plan.  Discharge Instructions:   Discharge Instructions      Please begin methylprednisolone, 1 row of tablets per day, tomorrow morning with your breakfast meal.  Please also begin triamcinolone cream apply to affected areas twice daily.  You do not have relief of your symptoms after completing the methylprednisolone tablets, please follow-up with your primary care provider to discuss possible referral to either dermatology or allergy and immunology.  Thank you for visiting urgent care today.      This office note has been dictated using Museum/gallery curator.  Unfortunately, and despite my best efforts, this method of dictation can sometimes lead to occasional typographical or grammatical errors.  I apologize in advance if this occurs.  Lynden Oxford Scales, PA-C 08/01/21  1529

## 2021-08-01 NOTE — ED Triage Notes (Signed)
Patient presents to Urgent Care with complaints of rash on bilateral arms that is itchy and painful for one week Patient reports  she has been applying cortisone cream.

## 2021-08-17 ENCOUNTER — Other Ambulatory Visit: Payer: Self-pay | Admitting: Family

## 2021-08-17 DIAGNOSIS — Z1231 Encounter for screening mammogram for malignant neoplasm of breast: Secondary | ICD-10-CM

## 2021-10-25 ENCOUNTER — Other Ambulatory Visit: Payer: Self-pay | Admitting: Family

## 2021-10-25 DIAGNOSIS — I10 Essential (primary) hypertension: Secondary | ICD-10-CM

## 2021-10-25 NOTE — Telephone Encounter (Signed)
Requested medications are due for refill today.  yes  Requested medications are on the active medications list.  yes  Last refill. 06/24/2021 #120 0 rf  Future visit scheduled.   yes  Notes to clinic.  Rx written to expire 10/22/2021 - Rx is expired.    Requested Prescriptions  Pending Prescriptions Disp Refills   amLODipine (NORVASC) 5 MG tablet [Pharmacy Med Name: amLODIPine Besylate 5 MG Oral Tablet] 120 tablet 0    Sig: Take 1 tablet by mouth once daily     Cardiovascular: Calcium Channel Blockers 2 Passed - 10/25/2021  4:56 AM      Passed - Last BP in normal range    BP Readings from Last 1 Encounters:  08/01/21 125/84         Passed - Last Heart Rate in normal range    Pulse Readings from Last 1 Encounters:  08/01/21 73         Passed - Valid encounter within last 6 months    Recent Outpatient Visits           4 months ago Primary hypertension   Primary Care at [facility], Amy J, NP   8 months ago Primary hypertension   Primary Care at [facility] [place], NP   1 year ago Annual physical exam   Primary Care at [facility], Amy J, NP   1 year ago Encounter to establish care   Primary Care at [facility], Flonnie Hailstone, NP
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
## 2021-11-11 ENCOUNTER — Ambulatory Visit
Admission: RE | Admit: 2021-11-11 | Discharge: 2021-11-11 | Disposition: A | Discharge status: Home / Self Care | Payer: Managed Care, Other (non HMO) | Source: Ambulatory Visit | Attending: Family | Admitting: Family

## 2021-11-11 DIAGNOSIS — Z1231 Encounter for screening mammogram for malignant neoplasm of breast: Secondary | ICD-10-CM

## 2022-02-07 NOTE — Progress Notes (Signed)
Patient ID: Janice Terrell, female    DOB: Mar 03, 1975  MRN: [id number]  CC: Chronic Care Management   Subjective: Janice Terrell is a 47 y.o. female who presents for chronic care management.   Her concerns today include:  Doing well on Amlodipine, no issues/concerns. Denies red flag symptoms. Patient states that she does not have any chest discomfort/symptoms and no breast related concerns. States the CMA misunderstood her because she was referring to her friend has these symptoms and when she told the patient about this it worried her. She would like to begin Phentermine again.   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 06/15/2021   Degenerative tear of medial meniscus of right knee 06/15/2021   Pain in joint of right elbow 04/15/2021   Triceps tendinitis 04/15/2021   Bacterial vaginitis 09/28/2020   Candida vaginitis 09/28/2020   Prediabetes 09/27/2020   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/24/2020   Screening for malignant neoplasm of colon 09/24/2020   Abnormal uterine bleeding 08/06/2020   Abnormal vaginal bleeding 08/06/2020   Pain in pelvis 08/06/2020   Closed fracture of coronoid process of left ulna 07/20/2020   Closed fracture of head of left radius 07/20/2020   Primary osteoarthritis of left knee 02/21/2019   Cyst of left ovary 08/28/2018   Irregular periods 11/17/2014   Obesity due to excess calories 04/27/2011     Current Outpatient Medications on File Prior to Visit  Medication Sig Dispense Refill   acetaminophen (TYLENOL) 500 MG tablet Take 1,000 mg by mouth every 6 (six) hours as needed for headache or moderate pain.     Ascorbic Acid (VITAMIN C) 1000 MG tablet Take 1,000 mg by mouth daily.     meloxicam (MOBIC) 15 MG tablet Take 15 mg by mouth daily.     triamcinolone cream (KENALOG) 0.1 % Apply 1 Application topically 2 (two) times daily. Apply to affected area(s) twice daily , do not  apply to face. 80 g 0   No current facility-administered medications on file prior to visit.    No Known Allergies  Social History   Socioeconomic History   Marital status: Married    Spouse name: Not on file   Number of children: Not on file   Years of education: Not on file   Highest education level: Not on file  Occupational History   Not on file  Tobacco Use   Smoking status: Never    Passive exposure: Never   Smokeless tobacco: Never  Vaping Use   Vaping Use: Never used  Substance and Sexual Activity   Alcohol use: Yes    Comment: occasionally   Drug use: No   Sexual activity: Not on file  Other Topics Concern   Not on file  Social History Narrative   Not on file   Social Determinants of Health   Financial Resource Strain: Not on file  Food Insecurity: Not on file  Transportation Needs: Not on file  Physical Activity: Not on file  Stress: Not on file  Social Connections: Not on file  Intimate Partner Violence: Not on file    Family History  Problem Relation Age of Onset   Diabetes Sister    Diabetes Maternal Grandmother    Diabetes Other     Past Surgical History:  Procedure Laterality Date   ABDOMINAL HYSTERECTOMY     BREATH TEK H PYLORI  05/19/2011   Procedure: BREATH TEK H  PYLORI;  Surgeon: Pedro Earls, MD;  Location: [facility];  Service: General;  Laterality: N/A;   [place]   COLONOSCOPY WITH PROPOFOL N/A 02/06/2020   Procedure: COLONOSCOPY WITH PROPOFOL;  Surgeon: Carol Ada, MD;  Location: [facility];  Service: Endoscopy;  Laterality: N/A;   CYSTOSCOPY  11/17/2014   Procedure: CYSTOSCOPY;  Surgeon: Crawford Givens, MD;  Location: [facility];  Service: Gynecology;;   LAPAROSCOPIC LYSIS OF ADHESIONS N/A 08/28/2018   Procedure: Laparoscopic Lysis Of Adhesions with repair of serosal tear of colon;  Surgeon: Crawford Givens, MD;  Location: [facility];  Service: Gynecology;  Laterality: N/A;   POLYPECTOMY  02/06/2020   Procedure:  POLYPECTOMY;  Surgeon: Carol Ada, MD;  Location: [facility];  Service: Endoscopy;;   TUBAL LIGATION      ROS: Review of Systems Negative except as stated above  PHYSICAL EXAM: BP 131/83 (BP Location: Left Arm, Patient Position: Sitting, Cuff Size: Large)   Pulse 78   Temp 98.3 F (36.8 C)   Resp 16   Ht 4' 11.06" (1.5 m)   Wt 293 lb (132.9 kg)   LMP 08/09/2014   SpO2 95%   BMI 59.07 kg/m   Physical Exam HENT:     Head: Normocephalic and atraumatic.  Eyes:     Extraocular Movements: Extraocular movements intact.     Conjunctiva/sclera: Conjunctivae normal.     Pupils: Pupils are equal, round, and reactive to light.  Cardiovascular:     Rate and Rhythm: Normal rate and regular rhythm.     Pulses: Normal pulses.     Heart sounds: Normal heart sounds.  Pulmonary:     Effort: Pulmonary effort is normal.     Breath sounds: Normal breath sounds.  Musculoskeletal:     Cervical back: Normal range of motion and neck supple.  Neurological:     General: No focal deficit present.     Mental Status: She is alert and oriented to person, place, and time.  Psychiatric:        Mood and Affect: Mood normal.        Behavior: Behavior normal.     Results for orders placed or performed in visit on 02/10/22  POCT glycosylated hemoglobin (Hb A1C)  Result Value Ref Range   Hemoglobin A1C     HbA1c POC (<> result, manual entry)     HbA1c, POC (prediabetic range) 6.1 5.7 - 6.4 %   HbA1c, POC (controlled diabetic range)      ASSESSMENT AND PLAN: 1. Primary hypertension - Continue Amlodipine as prescribed. Counseled on medication adherence/adverse effects.  - Update BMP.  - Counseled on blood pressure goal of less than 130/80, low-sodium, DASH diet, medication compliance, and 150 minutes of moderate intensity exercise per week as tolerated. Counseled on medication adherence and adverse effects. - Follow-up with primary provider in 3 months or sooner if needed. - Basic Metabolic  Panel - amLODipine (NORVASC) 5 MG tablet; Take 1 tablet (5 mg total) by mouth daily.  Dispense: 30 tablet; Refill: 2  2. Prediabetes - Hemoglobin A1c remaining in prediabetic range. Recheck in 6 months.  - POCT glycosylated hemoglobin (Hb A1C)  3. Screening cholesterol level - Routine screening.  - Lipid panel  4. Encounter for weight management - Phentermine as prescribed. Counseled on medication adherence/adverse effects.  - I did check the [facility] prescription drug database and found no frequent prescribers of opiates or evidence of aberrant behavior. - Follow-up with primary provider in 4  weeks or sooner if needed for weight check.  - phentermine 15 MG capsule; Take 1 capsule (15 mg total) by mouth every morning.  Dispense: 30 capsule; Refill: 0   Patient was given the opportunity to ask questions.  Patient verbalized understanding of the plan and was able to repeat key elements of the plan. Patient was given clear instructions to go to Emergency Department or return to medical center if symptoms don't improve, worsen, or new problems develop.The patient verbalized understanding.   Orders Placed This Encounter  Procedures   Basic Metabolic Panel   Lipid panel   POCT glycosylated hemoglobin (Hb A1C)     Requested Prescriptions   Signed Prescriptions Disp Refills   amLODipine (NORVASC) 5 MG tablet 30 tablet 2    Sig: Take 1 tablet (5 mg total) by mouth daily.   phentermine 15 MG capsule 30 capsule 0    Sig: Take 1 capsule (15 mg total) by mouth every morning.    Return in about 3 months (around 05/12/2022) for Follow-Up or next available chronic care mgmt .  Camillia Herter, NP

## 2022-02-10 ENCOUNTER — Encounter: Payer: Self-pay | Admitting: Family

## 2022-02-10 ENCOUNTER — Ambulatory Visit (INDEPENDENT_AMBULATORY_CARE_PROVIDER_SITE_OTHER): Payer: Managed Care, Other (non HMO) | Admitting: Family

## 2022-02-10 ENCOUNTER — Other Ambulatory Visit: Payer: Self-pay | Admitting: Family

## 2022-02-10 VITALS — BP 131/83 | HR 78 | Temp 98.3°F | Resp 16 | Ht 59.06 in | Wt 293.0 lb

## 2022-02-10 DIAGNOSIS — Z13220 Encounter for screening for lipoid disorders: Secondary | ICD-10-CM

## 2022-02-10 DIAGNOSIS — I10 Essential (primary) hypertension: Secondary | ICD-10-CM

## 2022-02-10 DIAGNOSIS — Z7689 Persons encountering health services in other specified circumstances: Secondary | ICD-10-CM

## 2022-02-10 DIAGNOSIS — R7303 Prediabetes: Secondary | ICD-10-CM | POA: Diagnosis not present

## 2022-02-10 LAB — POCT GLYCOSYLATED HEMOGLOBIN (HGB A1C): HbA1c, POC (prediabetic range): 6.1 % (ref 5.7–6.4)

## 2022-02-10 MED ORDER — PHENTERMINE HCL 15 MG PO CAPS: 15.0000 mg | ORAL_CAPSULE | ORAL | 0 refills | Status: DC

## 2022-02-10 MED ORDER — AMLODIPINE BESYLATE 5 MG PO TABS: 5.0000 mg | ORAL_TABLET | Freq: Every day | ORAL | 2 refills | Status: DC

## 2022-02-10 NOTE — Progress Notes (Signed)
.  Pt presents for chronic care management   -pt states that she feels a little tightness around her left breast upper chest area -pt would like to start phentermine again

## 2022-02-10 NOTE — Telephone Encounter (Signed)
Refilled 02/10/2022  - confirmed by same pharmacy. Requested Prescriptions  Pending Prescriptions Disp Refills   amLODipine (NORVASC) 5 MG tablet [Pharmacy Med Name: amLODIPine Besylate 5 MG Oral Tablet] 90 tablet 0    Sig: Take 1 tablet by mouth once daily     Cardiovascular: Calcium Channel Blockers 2 Passed - 02/10/2022  5:02 PM      Passed - Last BP in normal range    BP Readings from Last 1 Encounters:  02/10/22 131/83         Passed - Last Heart Rate in normal range    Pulse Readings from Last 1 Encounters:  02/10/22 78         Passed - Valid encounter within last 6 months    Recent Outpatient Visits           Today Primary hypertension   [facility] at [facility], Amy J, NP   7 months ago Primary hypertension   Primary [facility] at [facility], Amy J, NP   1 year ago Primary hypertension   Primary Care at [facility], [place], NP   1 year ago Annual physical exam   Primary Care at [facility], [place], NP   1 year ago Encounter to establish care   Primary Care at [facility], Flonnie Hailstone, NP       Future Appointments             In 3 months Camillia Herter, NP [facility] at [place]

## 2022-02-11 LAB — LIPID PANEL
Chol/HDL Ratio: 2.9 ratio (ref 0.0–4.4)
Cholesterol, Total: 114 mg/dL (ref 100–199)
HDL: 39 mg/dL — ABNORMAL LOW (ref >39)
LDL Chol Calc (NIH): 55 mg/dL (ref 0–99)
Triglycerides: 108 mg/dL (ref 0–149)
VLDL Cholesterol Cal: 20 mg/dL (ref 5–40)

## 2022-02-11 LAB — BASIC METABOLIC PANEL
BUN/Creatinine Ratio: 18 (ref 9–23)
BUN: 11 mg/dL (ref 6–24)
CO2: 24 mmol/L (ref 20–29)
Calcium: 9.1 mg/dL (ref 8.7–10.2)
Chloride: 102 mmol/L (ref 96–106)
Creatinine, Ser: 0.62 mg/dL (ref 0.57–1.00)
Glucose: 100 mg/dL — ABNORMAL HIGH (ref 70–99)
Potassium: 4.2 mmol/L (ref 3.5–5.2)
Sodium: 140 mmol/L (ref 134–144)
eGFR: 110 mL/min/{1.73_m2} (ref >59)

## 2022-02-14 ENCOUNTER — Other Ambulatory Visit: Payer: Self-pay

## 2022-02-14 DIAGNOSIS — I10 Essential (primary) hypertension: Secondary | ICD-10-CM

## 2022-02-14 MED ORDER — AMLODIPINE BESYLATE 5 MG PO TABS: 5.0000 mg | ORAL_TABLET | Freq: Every day | ORAL | 0 refills | Status: DC

## 2022-02-14 NOTE — Progress Notes (Signed)
Att to contact pt to confirm that she requested to have Amlodipine '5mg'$  sent to mail order pharmacy call disconnected in middle of vm unable to complete voice message.

## 2022-02-24 DIAGNOSIS — M79642 Pain in left hand: Secondary | ICD-10-CM | POA: Insufficient documentation

## 2022-03-20 ENCOUNTER — Other Ambulatory Visit: Payer: Self-pay | Admitting: Family

## 2022-03-20 DIAGNOSIS — Z7689 Persons encountering health services in other specified circumstances: Secondary | ICD-10-CM

## 2022-03-20 NOTE — Telephone Encounter (Signed)
Medication Refill - Medication: phentermine 15 MG capsule   Has the patient contacted their pharmacy? No. Pt previously told to contact provider  Preferred Pharmacy (with phone number or street name):  [facility] [place] [address] Phone: [phone]  Fax: [phone]     Has the patient been seen for an appointment in the last year OR does the patient have an upcoming appointment? Yes.    Agent: Please be advised that RX refills may take up to 3 business days. We ask that you follow-up with your pharmacy.

## 2022-03-21 NOTE — Telephone Encounter (Signed)
Schedule weight check.

## 2022-03-21 NOTE — Telephone Encounter (Signed)
Requested medication (s) are due for refill today: yes  Requested medication (s) are on the active medication list: yes  Last refill:  02/10/22  Future visit scheduled: yes  Notes to clinic:  Unable to refill per protocol, cannot delegate.      Requested Prescriptions  Pending Prescriptions Disp Refills   phentermine 15 MG capsule 30 capsule 0    Sig: Take 1 capsule (15 mg total) by mouth every morning.     Not Delegated - Neurology: Anticonvulsants - Controlled - phentermine hydrochloride Failed - 03/20/2022 11:29 AM      Failed - This refill cannot be delegated      Passed - eGFR in normal range and within 360 days    GFR calc Af Amer  Date Value Ref Range Status  09/30/2019 >60 >60 mL/min Final   GFR, Estimated  Date Value Ref Range Status  07/08/2020 >60 >60 mL/min Final    Comment:    (NOTE) Calculated using the CKD-EPI Creatinine Equation (2021)    eGFR  Date Value Ref Range Status  02/10/2022 110 >59 mL/min/1.73 Final         Passed - Cr in normal range and within 360 days    Creat  Date Value Ref Range Status  04/27/2011 0.68 0.50 - 1.10 mg/dL Final   Creatinine, Ser  Date Value Ref Range Status  02/10/2022 0.62 0.57 - 1.00 mg/dL Final         Passed - Last BP in normal range    BP Readings from Last 1 Encounters:  02/10/22 131/83         Passed - Valid encounter within last 6 months    Recent Outpatient Visits           1 month ago Primary hypertension   [place] [facility], [place], NP   9 months ago Primary hypertension   [place] [facility] at [facility], [place], NP   1 year ago Primary hypertension   [place] [facility] at [facility], [place], NP   1 year ago Annual physical exam   [place] [facility] at [facility], [place], NP   1 year ago Encounter to establish care   [place] [facility] Care at [facility], Flonnie Hail[place], NP       Future Appointments              In 1 month Camillia Herter, NP [facility] at [facility] - Weight completed in the last 3 months    Wt Readings from Last 1 Encounters:  02/10/22 293 lb (132.9 kg)

## 2022-03-31 ENCOUNTER — Other Ambulatory Visit: Payer: Self-pay | Admitting: Family

## 2022-03-31 DIAGNOSIS — I10 Essential (primary) hypertension: Secondary | ICD-10-CM

## 2022-04-03 NOTE — Telephone Encounter (Signed)
Last RF 90 1 RF too soon  Requested Prescriptions  Refused Prescriptions Disp Refills   amLODipine (NORVASC) 5 MG tablet [Pharmacy Med Name: AMLODIPINE TAB '5MG'$ ] 90 tablet 0    Sig: TAKE 1 TABLET DAILY     Cardiovascular: Calcium Channel Blockers 2 Passed - 03/31/2022  2:31 PM      Passed - Last BP in normal range    BP Readings from Last 1 Encounters:  02/10/22 131/83         Passed - Last Heart Rate in normal range    Pulse Readings from Last 1 Encounters:  02/10/22 78         Passed - Valid encounter within last 6 months    Recent Outpatient Visits           1 month ago Primary hypertension   [place] [facility] at [facility], [place], NP   9 months ago Primary hypertension   [place] [facility] at [facility], [place], NP   1 year ago Primary hypertension   [place] [facility] at [facility], [place], NP   1 year ago Annual physical exam   [place] [facility] at [facility], [place], NP   1 year ago Encounter to establish care   [facility] at [facility], Flonnie Hailstone, NP       Future Appointments             In 1 month Camillia Herter, NP [place] at [facility]

## 2022-04-12 NOTE — Progress Notes (Signed)
Patient ID: Janice Terrell, female    DOB: 10/06/1975  MRN: [id number]  CC: Weight Check  Subjective: Janice Terrell is a 47 y.o. female who presents for weight check.   Her concerns today include:  - Doing well on Phentermine, no issues/concerns. Reports using a regimen she found online of baking soda and apple cider vinegar to help curb appetite. She is interested in beginning [place] but unsure if her health insurance will cover. She plans to call them to get more details regarding this.  - Needs refills on Amlodipine, no issues/concerns. She denies red flag symptoms such as but not limited to chest pain, shortness of breath, worst headache of life, nausea/vomiting.   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 06/15/2021   Degenerative tear of medial meniscus of right knee 06/15/2021   Pain in joint of right elbow 04/15/2021   Triceps tendinitis 04/15/2021   Bacterial vaginitis 09/28/2020   Candida vaginitis 09/28/2020   Prediabetes 09/27/2020   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/24/2020   Screening for malignant neoplasm of colon 09/24/2020   Abnormal uterine bleeding 08/06/2020   Abnormal vaginal bleeding 08/06/2020   Pain in pelvis 08/06/2020   Closed fracture of coronoid process of left ulna 07/20/2020   Closed fracture of head of left radius 07/20/2020   Primary osteoarthritis of left knee 02/21/2019   Cyst of left ovary 08/28/2018   Irregular periods 11/17/2014   Obesity due to excess calories 04/27/2011     Current Outpatient Medications on File Prior to Visit  Medication Sig Dispense Refill   acetaminophen (TYLENOL) 500 MG tablet Take 1,000 mg by mouth every 6 (six) hours as needed for headache or moderate pain.     Ascorbic Acid (VITAMIN C) 1000 MG tablet Take 1,000 mg by mouth daily.     meloxicam (MOBIC) 15 MG tablet Take 15 mg by mouth daily.     triamcinolone cream (KENALOG) 0.1 % Apply 1  Application topically 2 (two) times daily. Apply to affected area(s) twice daily , do not apply to face. 80 g 0   No current facility-administered medications on file prior to visit.    No Known Allergies  Social History   Socioeconomic History   Marital status: Married    Spouse name: Not on file   Number of children: Not on file   Years of education: Not on file   Highest education level: Not on file  Occupational History   Not on file  Tobacco Use   Smoking status: Never    Passive exposure: Never   Smokeless tobacco: Never  Vaping Use   Vaping Use: Never used  Substance and Sexual Activity   Alcohol use: Yes    Comment: occasionally   Drug use: No   Sexual activity: Not on file  Other Topics Concern   Not on file  Social History Narrative   Not on file   Social Determinants of Health   Financial Resource Strain: Not on file  Food Insecurity: Not on file  Transportation Needs: Not on file  Physical Activity: Not on file  Stress: Not on file  Social Connections: Not on file  Intimate Partner Violence: Not on file    Family History  Problem Relation Age of Onset   Diabetes Sister    Diabetes Maternal Grandmother    Diabetes Other     Past Surgical History:  Procedure Laterality Date  ABDOMINAL HYSTERECTOMY     BREATH TEK H PYLORI  05/19/2011   Procedure: BREATH TEK H PYLORI;  Surgeon: Valarie Merino, MD;  Location: [facility];  Service: General;  Laterality: N/A;   CESAREAN SECTION  1998   COLONOSCOPY WITH PROPOFOL N/A 02/06/2020   Procedure: COLONOSCOPY WITH PROPOFOL;  Surgeon: Jeani Hawking, MD;  Location: [facility];  Service: Endoscopy;  Laterality: N/A;   CYSTOSCOPY  11/17/2014   Procedure: CYSTOSCOPY;  Surgeon: Jaymes Graff, MD;  Location: [facility];  Service: Gynecology;;   LAPAROSCOPIC LYSIS OF ADHESIONS N/A 08/28/2018   Procedure: Laparoscopic Lysis Of Adhesions with repair of serosal tear of colon;  Surgeon: Jaymes Graff, MD;  Location[facility];  Service: Gynecology;  Laterality: N/A;   POLYPECTOMY  02/06/2020   Procedure: POLYPECTOMY;  Surgeon: Jeani Hawking, MD;  Location: [facility];  Service: Endoscopy;;   TUBAL LIGATION      ROS: Review of Systems Negative except as stated above  PHYSICAL EXAM: BP 126/78 (BP Location: Left Arm, Patient Position: Sitting, Cuff Size: Large)   Pulse 79   Temp 98.3 F (36.8 C)   Resp 16   Ht 4' 11.06" (1.5 m)   Wt 286 lb (129.7 kg)   LMP 08/09/2014   SpO2 97%   BMI 57.66 kg/m    Wt Readings from Last 3 Encounters:  04/13/22 286 lb (129.7 kg)  02/10/22 293 lb (132.9 kg)  06/24/21 284 lb (128.8 kg)    Physical Exam HENT:     Head: Normocephalic and atraumatic.  Eyes:     Extraocular Movements: Extraocular movements intact.     Conjunctiva/sclera: Conjunctivae normal.     Pupils: Pupils are equal, round, and reactive to light.  Cardiovascular:     Rate and Rhythm: Normal rate and regular rhythm.     Pulses: Normal pulses.     Heart sounds: Normal heart sounds.  Pulmonary:     Effort: Pulmonary effort is normal.     Breath sounds: Normal breath sounds.  Musculoskeletal:     Cervical back: Normal range of motion and neck supple.  Neurological:     General: No focal deficit present.     Mental Status: She is alert and oriented to person, place, and time.  Psychiatric:        Mood and Affect: Mood normal.        Behavior: Behavior normal.      ASSESSMENT AND PLAN: 1. Encounter for weight management - Patient lost 7 pounds since last visit. Commended her on this.  - Increase Phentermine from 15 mg daily to 30 mg daily.  - Follow-up with primary provider in 4 weeks or sooner if needed for weight check. - phentermine 30 MG capsule; Take 1 capsule (30 mg total) by mouth every morning.  Dispense: 30 capsule; Refill: 0  2. Primary hypertension - Continue Amlodipine as prescribed.  - Counseled on blood pressure goal of less than 130/80, low-sodium, DASH diet, medication  compliance, and 150 minutes of moderate intensity exercise per week as tolerated. Counseled on medication adherence and adverse effects. - Follow-up with primary provider in 3 months or sooner if needed.  - amLODipine (NORVASC) 5 MG tablet; Take 1 tablet (5 mg total) by mouth daily.  Dispense: 90 tablet; Refill: 0   Patient was given the opportunity to ask questions.  Patient verbalized understanding of the plan and was able to repeat key elements of the plan. Patient was given clear instructions to go to Emergency  Department or return to medical center if symptoms don't improve, worsen, or new problems develop.The patient verbalized understanding.    Requested Prescriptions   Signed Prescriptions Disp Refills   phentermine 30 MG capsule 30 capsule 0    Sig: Take 1 capsule (30 mg total) by mouth every morning.   amLODipine (NORVASC) 5 MG tablet 90 tablet 0    Sig: Take 1 tablet (5 mg total) by mouth daily.    Return in about 4 weeks (around 05/11/2022) for Follow-Up or next available weight check.  Rema Fendt, NP

## 2022-04-13 ENCOUNTER — Encounter: Payer: Self-pay | Admitting: Family

## 2022-04-13 ENCOUNTER — Ambulatory Visit (INDEPENDENT_AMBULATORY_CARE_PROVIDER_SITE_OTHER): Payer: Managed Care, Other (non HMO) | Admitting: Family

## 2022-04-13 VITALS — BP 126/78 | HR 79 | Temp 98.3°F | Resp 16 | Ht 59.06 in | Wt 286.0 lb

## 2022-04-13 DIAGNOSIS — Z7689 Persons encountering health services in other specified circumstances: Secondary | ICD-10-CM | POA: Diagnosis not present

## 2022-04-13 DIAGNOSIS — I10 Essential (primary) hypertension: Secondary | ICD-10-CM

## 2022-04-13 MED ORDER — AMLODIPINE BESYLATE 5 MG PO TABS: 5.0000 mg | ORAL_TABLET | Freq: Every day | ORAL | 0 refills | Status: DC

## 2022-04-13 MED ORDER — PHENTERMINE HCL 30 MG PO CAPS: 30.0000 mg | ORAL_CAPSULE | ORAL | 0 refills | Status: DC

## 2022-04-13 NOTE — Progress Notes (Signed)
.  Pt presents for weight management follow-up   -down 7lbs

## 2022-05-03 NOTE — Progress Notes (Signed)
Patient ID: Janice Terrell, female    DOB: 1976/01/26  MRN: [id number]  CC: Annual Physical Exam  Subjective: Janice Terrell is a 47 y.o. female who presents for annal physical exam.  Her concerns today include:  Doing well on Phentermine, no issues/concerns.   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 06/15/2021   Degenerative tear of medial meniscus of right knee 06/15/2021   Pain in joint of right elbow 04/15/2021   Triceps tendinitis 04/15/2021   Bacterial vaginitis 09/28/2020   Candida vaginitis 09/28/2020   Prediabetes 09/27/2020   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/24/2020   Screening for malignant neoplasm of colon 09/24/2020   Abnormal uterine bleeding 08/06/2020   Abnormal vaginal bleeding 08/06/2020   Pain in pelvis 08/06/2020   Closed fracture of coronoid process of left ulna 07/20/2020   Closed fracture of head of left radius 07/20/2020   Primary osteoarthritis of left knee 02/21/2019   Cyst of left ovary 08/28/2018   Irregular periods 11/17/2014   Obesity due to excess calories 04/27/2011     Current Outpatient Medications on File Prior to Visit  Medication Sig Dispense Refill   acetaminophen (TYLENOL) 500 MG tablet Take 1,000 mg by mouth every 6 (six) hours as needed for headache or moderate pain.     amLODipine (NORVASC) 5 MG tablet Take 1 tablet (5 mg total) by mouth daily. 90 tablet 0   Ascorbic Acid (VITAMIN C) 1000 MG tablet Take 1,000 mg by mouth daily.     meloxicam (MOBIC) 15 MG tablet Take 15 mg by mouth daily.     triamcinolone cream (KENALOG) 0.1 % Apply 1 Application topically 2 (two) times daily. Apply to affected area(s) twice daily , do not apply to face. 80 g 0   No current facility-administered medications on file prior to visit.    No Known Allergies  Social History   Socioeconomic History   Marital status: Married    Spouse name: Not on file   Number of  children: Not on file   Years of education: Not on file   Highest education level: Not on file  Occupational History   Not on file  Tobacco Use   Smoking status: Never    Passive exposure: Never   Smokeless tobacco: Never  Vaping Use   Vaping Use: Never used  Substance and Sexual Activity   Alcohol use: Yes    Comment: occasionally   Drug use: No   Sexual activity: Not on file  Other Topics Concern   Not on file  Social History Narrative   Not on file   Social Determinants of Health   Financial Resource Strain: Not on file  Food Insecurity: Not on file  Transportation Needs: Not on file  Physical Activity: Not on file  Stress: Not on file  Social Connections: Not on file  Intimate Partner Violence: Not on file    Family History  Problem Relation Age of Onset   Diabetes Sister    Diabetes Maternal Grandmother    Diabetes Other     Past Surgical History:  Procedure Laterality Date   ABDOMINAL HYSTERECTOMY     BREATH TEK H PYLORI  05/19/2011   Procedure: BREATH TEK H PYLORI;  Surgeon: Valarie Merino, MD;  Location: [facility];  Service: General;  Laterality: N/A;   CESAREAN SECTION  1998   COLONOSCOPY WITH PROPOFOL N/A 02/06/2020   Procedure: COLONOSCOPY WITH  PROPOFOL;  Surgeon: Jeani Hawking, MD;  Location: [facility];  Service: Endoscopy;  Laterality: N/A;   CYSTOSCOPY  11/17/2014   Procedure: CYSTOSCOPY;  Surgeon: Jaymes Graff, MD;  Location: [facility];  Service: Gynecology;;   LAPAROSCOPIC LYSIS OF ADHESIONS N/A 08/28/2018   Procedure: Laparoscopic Lysis Of Adhesions with repair of serosal tear of colon;  Surgeon: Jaymes Graff, MD;  Location: [facility];  Service: Gynecology;  Laterality: N/A;   POLYPECTOMY  02/06/2020   Procedure: POLYPECTOMY;  Surgeon: Jeani Hawking, MD;  Location: [facility];  Service: Endoscopy;;   TUBAL LIGATION      ROS: Review of Systems Negative except as stated above  PHYSICAL EXAM: BP 123/83 (BP Location: Right Arm, Patient  Position: Sitting, Cuff Size: Large)   Pulse 90   Temp 98.6 F (37 C)   Resp 16   Ht  (1.499 m)   Wt 281 lb 3.2 oz (127.6 kg)   LMP 08/09/2014   SpO2 97%   BMI 56.80 kg/m   Wt Readings from Last 3 Encounters:  05/12/22 281 lb 3.2 oz (127.6 kg)  04/13/22 286 lb (129.7 kg)  02/10/22 293 lb (132.9 kg)    Physical Exam HENT:     Head: Normocephalic and atraumatic.     Right Ear: Tympanic membrane, ear canal and external ear normal.     Left Ear: Tympanic membrane, ear canal and external ear normal.     Nose: Nose normal.     Mouth/Throat:     Mouth: Mucous membranes are moist.     Pharynx: Oropharynx is clear.  Eyes:     Extraocular Movements: Extraocular movements intact.     Conjunctiva/sclera: Conjunctivae normal.     Pupils: Pupils are equal, round, and reactive to light.  Cardiovascular:     Rate and Rhythm: Normal rate and regular rhythm.     Pulses: Normal pulses.     Heart sounds: Normal heart sounds.  Pulmonary:     Effort: Pulmonary effort is normal.     Breath sounds: Normal breath sounds.  Chest:     Comments: Patient declined.  Abdominal:     General: Bowel sounds are normal.     Palpations: Abdomen is soft.  Genitourinary:    Comments: Patient declined.  Musculoskeletal:        General: Normal range of motion.     Right shoulder: Normal.     Left shoulder: Normal.     Right upper arm: Normal.     Left upper arm: Normal.     Right elbow: Normal.     Left elbow: Normal.     Right forearm: Normal.     Left forearm: Normal.     Right wrist: Normal.     Left wrist: Normal.     Right hand: Normal.     Left hand: Normal.     Cervical back: Normal, normal range of motion and neck supple.     Thoracic back: Normal.     Lumbar back: Normal.     Right hip: Normal.     Left hip: Normal.     Right upper leg: Normal.     Left upper leg: Normal.     Right knee: Normal.     Left knee: Normal.     Right lower leg: Normal.     Left lower leg: Normal.      Right ankle: Normal.     Left ankle: Normal.     Right foot: Normal.  Left foot: Normal.  Skin:    General: Skin is warm and dry.     Capillary Refill: Capillary refill takes less than 2 seconds.  Neurological:     General: No focal deficit present.     Mental Status: She is alert and oriented to person, place, and time.  Psychiatric:        Mood and Affect: Mood normal.        Behavior: Behavior normal.     ASSESSMENT AND PLAN: 1. Annual physical exam - Counseled on 150 minutes of exercise per week as tolerated, healthy eating (including decreased daily intake of saturated fats, cholesterol, added sugars, sodium), STI prevention, and routine healthcare maintenance.  2. Screening for metabolic disorder - Routine screening.  - Hepatic Function Panel  3. Screening for deficiency anemia - Routine screening.  - CBC  4. Thyroid disorder screen - Routine screening.  - TSH  5. Prediabetes - Routine screening.  - Hemoglobin A1c  6. Encounter for weight management 7. BMI 50.0-59.9, adult - Patient lost 5 pounds since last visit. Commended her on this.  - Increase Phentermine from 30 mg daily to 37.5 mg daily.  - I did check the [facility] prescription drug database.  - Follow-up with primary provider as scheduled.  - phentermine 37.5 MG capsule; Take 1 capsule (37.5 mg total) by mouth every morning.  Dispense: 30 capsule; Refill: 0  Patient was given the opportunity to ask questions.  Patient verbalized understanding of the plan and was able to repeat key elements of the plan. Patient was given clear instructions to go to Emergency Department or return to medical center if symptoms don't improve, worsen, or new problems develop.The patient verbalized understanding.   Orders Placed This Encounter  Procedures   Hepatic Function Panel   CBC   TSH   Hemoglobin A1c     Requested Prescriptions   Signed Prescriptions Disp Refills   phentermine 37.5 MG capsule 30  capsule 0    Sig: Take 1 capsule (37.5 mg total) by mouth every morning.    Return in about 1 year (around 05/12/2023) for Physical per patient preference.  Rema Fendt, NP

## 2022-05-12 ENCOUNTER — Ambulatory Visit (INDEPENDENT_AMBULATORY_CARE_PROVIDER_SITE_OTHER): Payer: Managed Care, Other (non HMO) | Admitting: Family

## 2022-05-12 ENCOUNTER — Encounter: Payer: Self-pay | Admitting: Family

## 2022-05-12 VITALS — BP 123/83 | HR 90 | Temp 98.6°F | Resp 16 | Ht 59.0 in | Wt 281.2 lb

## 2022-05-12 DIAGNOSIS — R7303 Prediabetes: Secondary | ICD-10-CM

## 2022-05-12 DIAGNOSIS — Z0000 Encounter for general adult medical examination without abnormal findings: Secondary | ICD-10-CM

## 2022-05-12 DIAGNOSIS — Z13228 Encounter for screening for other metabolic disorders: Secondary | ICD-10-CM | POA: Diagnosis not present

## 2022-05-12 DIAGNOSIS — Z0001 Encounter for general adult medical examination with abnormal findings: Secondary | ICD-10-CM

## 2022-05-12 DIAGNOSIS — Z7689 Persons encountering health services in other specified circumstances: Secondary | ICD-10-CM

## 2022-05-12 DIAGNOSIS — Z130 Encounter for screening for diseases of the blood and blood-forming organs and certain disorders involving the immune mechanism: Secondary | ICD-10-CM

## 2022-05-12 DIAGNOSIS — Z6841 Body Mass Index (BMI) 40.0 and over, adult: Secondary | ICD-10-CM | POA: Diagnosis not present

## 2022-05-12 DIAGNOSIS — Z1329 Encounter for screening for other suspected endocrine disorder: Secondary | ICD-10-CM

## 2022-05-12 MED ORDER — PHENTERMINE HCL 37.5 MG PO CAPS: 37.5000 mg | ORAL_CAPSULE | ORAL | 0 refills | Status: DC

## 2022-05-12 NOTE — Progress Notes (Signed)
Pt is here for annual physical.

## 2022-05-12 NOTE — Patient Instructions (Signed)
Preventive Care 40-48 Years Old, Female Preventive care refers to lifestyle choices and visits with your health care provider that can promote health and wellness. Preventive care visits are also called wellness exams. What can I expect for my preventive care visit? Counseling Your health care provider may ask you questions about your: Medical history, including: Past medical problems. Family medical history. Pregnancy history. Current health, including: Menstrual cycle. Method of birth control. Emotional well-being. Home life and relationship well-being. Sexual activity and sexual health. Lifestyle, including: Alcohol, nicotine or tobacco, and drug use. Access to firearms. Diet, exercise, and sleep habits. Work and work environment. Sunscreen use. Safety issues such as seatbelt and bike helmet use. Physical exam Your health care provider will check your: Height and weight. These may be used to calculate your BMI (body mass index). BMI is a measurement that tells if you are at a healthy weight. Waist circumference. This measures the distance around your waistline. This measurement also tells if you are at a healthy weight and may help predict your risk of certain diseases, such as type 2 diabetes and high blood pressure. Heart rate and blood pressure. Body temperature. Skin for abnormal spots. What immunizations do I need?  Vaccines are usually given at various ages, according to a schedule. Your health care provider will recommend vaccines for you based on your age, medical history, and lifestyle or other factors, such as travel or where you work. What tests do I need? Screening Your health care provider may recommend screening tests for certain conditions. This may include: Lipid and cholesterol levels. Diabetes screening. This is done by checking your blood sugar (glucose) after you have not eaten for a while (fasting). Pelvic exam and Pap test. Hepatitis B test. Hepatitis C  test. HIV (human immunodeficiency virus) test. STI (sexually transmitted infection) testing, if you are at risk. Lung cancer screening. Colorectal cancer screening. Mammogram. Talk with your health care provider about when you should start having regular mammograms. This may depend on whether you have a family history of breast cancer. BRCA-related cancer screening. This may be done if you have a family history of breast, ovarian, tubal, or peritoneal cancers. Bone density scan. This is done to screen for osteoporosis. Talk with your health care provider about your test results, treatment options, and if necessary, the need for more tests. Follow these instructions at home: Eating and drinking  Eat a diet that includes fresh fruits and vegetables, whole grains, lean protein, and low-fat dairy products. Take vitamin and mineral supplements as recommended by your health care provider. Do not drink alcohol if: Your health care provider tells you not to drink. You are pregnant, may be pregnant, or are planning to become pregnant. If you drink alcohol: Limit how much you have to 0-1 drink a day. Know how much alcohol is in your drink. In the [place] one drink equals one 12 oz bottle of beer (355 mL), one 5 oz glass of wine (148 mL), or one 1 oz glass of hard liquor (44 mL). Lifestyle Brush your teeth every morning and night with fluoride toothpaste. Floss one time each day. Exercise for at least 30 minutes 5 or more days each week. Do not use any products that contain nicotine or tobacco. These products include cigarettes, chewing tobacco, and vaping devices, such as e-cigarettes. If you need help quitting, ask your health care provider. Do not use drugs. If you are sexually active, practice safe sex. Use a condom or other form of protection to   prevent STIs. If you do not wish to become pregnant, use a form of birth control. If you plan to become pregnant, see your health care provider for a  prepregnancy visit. Take aspirin only as told by your health care provider. Make sure that you understand how much to take and what form to take. Work with your health care provider to find out whether it is safe and beneficial for you to take aspirin daily. Find healthy ways to manage stress, such as: Meditation, yoga, or listening to music. Journaling. Talking to a trusted person. Spending time with friends and family. Minimize exposure to UV radiation to reduce your risk of skin cancer. Safety Always wear your seat belt while driving or riding in a vehicle. Do not drive: If you have been drinking alcohol. Do not ride with someone who has been drinking. When you are tired or distracted. While texting. If you have been using any mind-altering substances or drugs. Wear a helmet and other protective equipment during sports activities. If you have firearms in your house, make sure you follow all gun safety procedures. Seek help if you have been physically or sexually abused. What's next? Visit your health care provider once a year for an annual wellness visit. Ask your health care provider how often you should have your eyes and teeth checked. Stay up to date on all vaccines. This information is not intended to replace advice given to you by your health care provider. Make sure you discuss any questions you have with your health care provider. Document Revised: 07/14/2020 Document Reviewed: 07/14/2020 Elsevier Patient Education  2023 Elsevier Inc.  

## 2022-05-13 LAB — HEPATIC FUNCTION PANEL
ALT: 31 IU/L (ref 0–32)
AST: 27 IU/L (ref 0–40)
Albumin: 4.3 g/dL (ref 3.9–4.9)
Alkaline Phosphatase: 128 IU/L — ABNORMAL HIGH (ref 44–121)
Bilirubin Total: 0.5 mg/dL (ref 0.0–1.2)
Bilirubin, Direct: 0.15 mg/dL (ref 0.00–0.40)
Total Protein: 7.6 g/dL (ref 6.0–8.5)

## 2022-05-13 LAB — HEMOGLOBIN A1C
Est. average glucose Bld gHb Est-mCnc: 137 mg/dL
Hgb A1c MFr Bld: 6.4 % — ABNORMAL HIGH (ref 4.8–5.6)

## 2022-05-13 LAB — CBC
Hematocrit: 42.1 % (ref 34.0–46.6)
Hemoglobin: 13.8 g/dL (ref 11.1–15.9)
MCH: 28.6 pg (ref 26.6–33.0)
MCHC: 32.8 g/dL (ref 31.5–35.7)
MCV: 87 fL (ref 79–97)
Platelets: 338 10*3/uL (ref 150–450)
RBC: 4.82 x10E6/uL (ref 3.77–5.28)
RDW: 13.3 % (ref 11.7–15.4)
WBC: 4 10*3/uL (ref 3.4–10.8)

## 2022-05-13 LAB — TSH: TSH: 0.673 u[IU]/mL (ref 0.450–4.500)

## 2022-05-15 ENCOUNTER — Encounter: Payer: Self-pay | Admitting: *Deleted

## 2022-06-12 ENCOUNTER — Other Ambulatory Visit: Payer: Self-pay | Admitting: Family

## 2022-06-12 DIAGNOSIS — Z7689 Persons encountering health services in other specified circumstances: Secondary | ICD-10-CM

## 2022-06-12 DIAGNOSIS — Z6841 Body Mass Index (BMI) 40.0 and over, adult: Secondary | ICD-10-CM

## 2022-06-12 NOTE — Telephone Encounter (Signed)
Requested medication (s) are due for refill today -yes  Requested medication (s) are on the active medication list - yes  Future visit scheduled -no  Last refill: 05/12/22 #30  Notes to clinic: non delegated Rx  Requested Prescriptions  Pending Prescriptions Disp Refills   phentermine 37.5 MG capsule 30 capsule 0    Sig: Take 1 capsule (37.5 mg total) by mouth every morning.     Not Delegated - Neurology: Anticonvulsants - Controlled - phentermine hydrochloride Failed - 06/12/2022  9:43 AM      Failed - This refill cannot be delegated      Passed - eGFR in normal range and within 360 days    GFR calc Af Amer  Date Value Ref Range Status  09/30/2019 >60 >60 mL/min Final   GFR, Estimated  Date Value Ref Range Status  07/08/2020 >60 >60 mL/min Final    Comment:    (NOTE) Calculated using the CKD-EPI Creatinine Equation (2021)    eGFR  Date Value Ref Range Status  02/10/2022 110 >59 mL/min/1.73 Final         Passed - Cr in normal range and within 360 days    Creat  Date Value Ref Range Status  04/27/2011 0.68 0.50 - 1.10 mg/dL Final   Creatinine, Ser  Date Value Ref Range Status  02/10/2022 0.62 0.57 - 1.00 mg/dL Final         Passed - Last BP in normal range    BP Readings from Last 1 Encounters:  05/12/22 123/83         Passed - Valid encounter within last 6 months    Recent Outpatient Visits           1 month ago Annual physical exam   [address] [facility], [place], [place]   2 months ago Encounter for weight management   [place] [facility], [place], NP   4 months ago Primary hypertension   [place] [facility], Amy J, NP   11 months ago Primary hypertension   [place] [facility], Amy J, NP   1 year ago Primary hypertension   [place] [facility], Amy J, NP              Passed - Weight completed in the  last 3 months    Wt Readings from Last 1 Encounters:  05/12/22 281 lb 3.2 oz (127.6 kg)            Requested Prescriptions  Pending Prescriptions Disp Refills   phentermine 37.5 MG capsule 30 capsule 0    Sig: Take 1 capsule (37.5 mg total) by mouth every morning.     Not Delegated - Neurology: Anticonvulsants - Controlled - phentermine hydrochloride Failed - 06/12/2022  9:43 AM      Failed - This refill cannot be delegated      Passed - eGFR in normal range and within 360 days    GFR calc Af Amer  Date Value Ref Range Status  09/30/2019 >60 >60 mL/min Final   GFR, Estimated  Date Value Ref Range Status  07/08/2020 >60 >60 mL/min Final    Comment:    (NOTE) Calculated using the CKD-EPI Creatinine Equation (2021)    eGFR  Date Value Ref Range Status  02/10/2022 110 >59 mL/min/1.73 Final         Passed -  Cr in normal range and within 360 days    Creat  Date Value Ref Range Status  04/27/2011 0.68 0.50 - 1.10 mg/dL Final   Creatinine, Ser  Date Value Ref Range Status  02/10/2022 0.62 0.57 - 1.00 mg/dL Final         Passed - Last BP in normal range    BP Readings from Last 1 Encounters:  05/12/22 123/83         Passed - Valid encounter within last 6 months    Recent Outpatient Visits           1 month ago Annual physical exam   [place] [facility], [place], [place]   2 months ago Encounter for weight management   [place] [facility], Amy J, NP   4 months ago Primary hypertension   [place] [facility], [place], NP   11 months ago Primary hypertension   [place] [facility], [place], NP   1 year ago Primary hypertension    [facility], Amy J, NP              Passed - Weight completed in the last 3 months    Wt Readings from Last 1 Encounters:  05/12/22 281 lb 3.2 oz (127.6 kg)

## 2022-06-12 NOTE — Telephone Encounter (Signed)
Medication Refill - Medication: phentermine 37.5 MG capsule   Has the patient contacted their pharmacy? Yes.   No, more refills.  (Agent: If no, request that the patient contact the pharmacy for the refill. If patient does not wish to contact the pharmacy document the reason why and proceed with request.)   Preferred Pharmacy (with phone number or street name):  [facility] [address] ([address]), [place] - [address]  [address] [place] ([place] [address]  Phone: [phone] Fax: [phone]  Hours: Not open 24 hours   Has the patient been seen for an appointment in the last year OR does the patient have an upcoming appointment? Yes.    Agent: Please be advised that RX refills may take up to 3 business days. We ask that you follow-up with your pharmacy.

## 2022-06-23 ENCOUNTER — Other Ambulatory Visit: Payer: Self-pay

## 2022-06-23 ENCOUNTER — Emergency Department (HOSPITAL_BASED_OUTPATIENT_CLINIC_OR_DEPARTMENT_OTHER)
Admission: EM | Admit: 2022-06-23 | Discharge: 2022-06-23 | Disposition: A | Discharge status: Home / Self Care | Payer: Managed Care, Other (non HMO) | Attending: Emergency Medicine | Admitting: Emergency Medicine

## 2022-06-23 ENCOUNTER — Encounter (HOSPITAL_BASED_OUTPATIENT_CLINIC_OR_DEPARTMENT_OTHER): Payer: Self-pay | Admitting: Emergency Medicine

## 2022-06-23 DIAGNOSIS — M545 Low back pain, unspecified: Secondary | ICD-10-CM | POA: Diagnosis present

## 2022-06-23 MED ORDER — LIDOCAINE 5 % EX PTCH
1.0000 | MEDICATED_PATCH | CUTANEOUS | Status: DC
  Administered 2022-06-23: 1 via TRANSDERMAL
  Filled 2022-06-23: qty 1

## 2022-06-23 MED ORDER — CYCLOBENZAPRINE HCL 10 MG PO TABS: 10.0000 mg | ORAL_TABLET | Freq: Two times a day (BID) | ORAL | 0 refills | Status: DC | PRN

## 2022-06-23 MED ORDER — KETOROLAC TROMETHAMINE 30 MG/ML IJ SOLN
30.0000 mg | Freq: Once | INTRAMUSCULAR | Status: AC
  Administered 2022-06-23: 30 mg via INTRAMUSCULAR
  Filled 2022-06-23: qty 1

## 2022-06-23 NOTE — ED Provider Notes (Signed)
[facility] Provider Note   CSN: [id number] Arrival date & time: 06/23/22  7829     History  Chief Complaint  Patient presents with   Back Pain    Janice Terrell is a 47 y.o. female.  Patient presents to the emergency department today for evaluation of left lower back pain.  This began spontaneously without falls or accidents.  She reports a sharp pain, worse when she lays on the area and worse with certain movements in her left lower back.  Pain does not radiate.  She has tried ibuprofen without relief.  She denies any significant strenuous activities prior to onset.  She has tried leftover "muscle relaxer" as well.  She has had back pain in the past but this does not happen frequently. Patient denies warning symptoms of back pain including: fecal incontinence, urinary retention or overflow incontinence, night sweats, waking from sleep with back pain, unexplained fevers or weight loss, h/o cancer, recent trauma.          Home Medications Prior to Admission medications   Medication Sig Start Date End Date Taking? Authorizing Provider  cyclobenzaprine (FLEXERIL) 10 MG tablet Take 1 tablet (10 mg total) by mouth 2 (two) times daily as needed for muscle spasms. 06/23/22  Yes Renne Crigler, PA-C  acetaminophen (TYLENOL) 500 MG tablet Take 1,000 mg by mouth every 6 (six) hours as needed for headache or moderate pain.    [provider]  amLODipine (NORVASC) 5 MG tablet Take 1 tablet (5 mg total) by mouth daily. 04/13/22 07/12/22  Rema Fendt, NP  Ascorbic Acid (VITAMIN C) 1000 MG tablet Take 1,000 mg by mouth daily.    [provider]  meloxicam (MOBIC) 15 MG tablet Take 15 mg by mouth daily.    [provider]  phentermine 37.5 MG capsule Take 1 capsule (37.5 mg total) by mouth every morning. 05/12/22   Rema Fendt, NP  triamcinolone cream (KENALOG) 0.1 % Apply 1 Application topically 2 (two) times daily.  Apply to affected area(s) twice daily , do not apply to face. 08/01/21   Theadora Rama Scales, PA-C      Allergies    Patient has no known allergies.    Review of Systems   Review of Systems  Physical Exam Updated Vital Signs BP (!) 176/94 (BP Location: Left Arm)   Pulse 93   Temp 98.2 F (36.8 C) (Oral)   Resp 17   LMP 08/09/2014   SpO2 100%   Physical Exam Vitals and nursing note reviewed.  Constitutional:      Appearance: She is well-developed.  HENT:     Head: Normocephalic and atraumatic.  Eyes:     Conjunctiva/sclera: Conjunctivae normal.  Pulmonary:     Effort: Pulmonary effort is normal.  Abdominal:     Palpations: Abdomen is soft.     Tenderness: There is no abdominal tenderness.  Musculoskeletal:        General: Normal range of motion.     Cervical back: Normal range of motion and neck supple.     Comments: No step-off noted with palpation of spine.   Skin:    General: Skin is warm and dry.     Findings: No rash.  Neurological:     Mental Status: She is alert.     Sensory: No sensory deficit.     Comments: 5/5 strength in entire lower extremities bilaterally. No sensation deficit.  ED Results / Procedures / Treatments   Labs (all labs ordered are listed, but only abnormal results are displayed) Labs Reviewed - No data to display  EKG None  Radiology No results found.  Procedures Procedures    Medications Ordered in ED Medications  ketorolac (TORADOL) 30 MG/ML injection 30 mg (has no administration in time range)  lidocaine (LIDODERM) 5 % 1 patch (1 patch Transdermal Patch Applied 06/23/22 1013)    ED Course/ Medical Decision Making/ A&P    Patient seen and examined. History obtained directly from patient.    Labs/EKG: None ordered.  Imaging: None ordered. Considering imaging of the lumbar spine with x-ray or CT but no red flags today and feel this would be low yield.   Medications/Fluids: Ordered: IM Toradol x 1, Lidoderm  patch  Most recent vital signs reviewed and are as follows: BP (!) 176/94 (BP Location: Left Arm)   Pulse 93   Temp 98.2 F (36.8 C) (Oral)   Resp 17   LMP 08/09/2014   SpO2 100%   Initial impression: low back pain without radicular features  Home treatment plan: Patient was counseled on back pain precautions and advised to do activity as tolerated but avoid strenuous activity and do not lift, push, or pull heavy objects more than 10 pounds for the next week.  Patient counseled to use ice or heat on back as needed for pain and spasm.    Medications prescribed: Flexeril for muscle pain and spasm. Patient counseled on proper use of muscle relaxant medication.  They were requested not to drink alcohol, drive any vehicle, or do any dangerous activities while taking this medication due to potential drowsiness and unintended.  Patient verbalized understanding.  Return instructions discussed with patient: Urged to return with worsening severe pain, loss of bowel or bladder control, trouble walking, development of weakness in the legs, or with any other concerns.  Follow-up instructions discussed with patient: Patient urged to follow-up with PCP if pain does not improve with treatment and rest or if pain becomes recurrent.                             Medical Decision Making Risk Prescription drug management.   Patient with back pain. No neurological deficits. Patient is ambulatory. No warning symptoms of back pain including: fecal incontinence, urinary retention or overflow incontinence, night sweats, waking from sleep with back pain, unexplained fevers or weight loss, h/o cancer, suspected IVDU, recent trauma. No concern for cauda equina, epidural abscess, or other serious cause of back pain. Conservative measures such as rest, ice/heat and pain medicine indicated with PCP follow-up if no improvement with conservative management.          Final Clinical Impression(s) / ED  Diagnoses Final diagnoses:  Acute left-sided low back pain without sciatica    Rx / DC Orders ED Discharge Orders          Ordered    cyclobenzaprine (FLEXERIL) 10 MG tablet  2 times daily PRN        06/23/22 1012              Renne Crigler, PA-C 06/23/22 1016    Horton, Clabe Seal, DO 06/24/22 [id number]

## 2022-06-23 NOTE — Discharge Instructions (Signed)
Please read and follow all provided instructions.  Your diagnoses today include:  1. Acute left-sided low back pain without sciatica    Tests performed today include: Vital signs - see below for your results today  Medications prescribed:  Flexeril (cyclobenzaprine) - muscle relaxer medication  DO NOT drive or perform any activities that require you to be awake and alert because this medicine can make you drowsy.   Take any prescribed medications only as directed.  Home care instructions:  Continue home anti-inflammatory such as meloxicam if prescribed, or other the counter ibuprofen or naproxen Follow any educational materials contained in this packet Please rest, use ice or heat on your back for the next several days Do not lift, push, pull anything more than 10 pounds for the next week  Follow-up instructions: Please follow-up with your primary care provider in the next 5 days for further evaluation of your symptoms.   Return instructions:  SEEK IMMEDIATE MEDICAL ATTENTION IF YOU HAVE: New numbness, tingling, weakness, or problem with the use of your arms or legs Severe back pain not relieved with medications Loss control of your bowels or bladder Increasing pain in any areas of the body (such as chest or abdominal pain) Shortness of breath, dizziness, or fainting.  Worsening nausea (feeling sick to your stomach), vomiting, fever, or sweats Any other emergent concerns regarding your health   Additional Information:  Your vital signs today were: BP (!) 176/94 (BP Location: Left Arm)   Pulse 93   Temp 98.2 F (36.8 C) (Oral)   Resp 17   LMP 08/09/2014   SpO2 100%  If your blood pressure (BP) was elevated above 135/85 this visit, please have this repeated by your doctor within one month. --------------

## 2022-06-23 NOTE — ED Triage Notes (Signed)
Pt here from home with c/o left lower back pain times 1 week , no trauma noted

## 2022-07-04 MED ORDER — PHENTERMINE HCL 37.5 MG PO CAPS: 37.5000 mg | ORAL_CAPSULE | ORAL | 0 refills | Status: DC

## 2022-07-04 NOTE — Telephone Encounter (Signed)
Copied from CRM #[id number]. Topic: General - Other >> Jun 27, 2022  5:29 PM Everette C wrote: Reason for CRM: The patient has called to follow up on their previous request for phentermine 37.5 MG capsule [[id number]]   Please contact the patient further when possible 

## 2022-07-04 NOTE — Telephone Encounter (Signed)
Duplicate

## 2022-07-04 NOTE — Telephone Encounter (Signed)
Copied from CRM [id number]. Topic: General - Other >> Jun 27, 2022  5:29 PM Everette C wrote: Reason for CRM: The patient has called to follow up on their previous request for phentermine 37.5 MG capsule [[id number]]   Please contact the patient further when possible

## 2022-07-04 NOTE — Telephone Encounter (Signed)
Phentermine prescribed. Please schedule appointment for weight check.

## 2022-10-03 ENCOUNTER — Other Ambulatory Visit: Payer: Self-pay | Admitting: Family

## 2022-10-03 DIAGNOSIS — Z6841 Body Mass Index (BMI) 40.0 and over, adult: Secondary | ICD-10-CM

## 2022-10-03 DIAGNOSIS — I10 Essential (primary) hypertension: Secondary | ICD-10-CM

## 2022-10-03 DIAGNOSIS — Z7689 Persons encountering health services in other specified circumstances: Secondary | ICD-10-CM

## 2022-10-03 NOTE — Telephone Encounter (Signed)
Medication Refill - Medication: phentermine 37.5 MG capsule / /amLODipine (NORVASC) 5 MG table   Has the patient contacted their pharmacy? yes (Agent: If yes, when and what did the pharmacy advise?)contact pcp  Preferred Pharmacy (with phone number or street name):  [facility] [address] [place]), [place] - [id number] Phone: [phone]  Fax: [phone]     Has the patient been seen for an appointment in the last year OR does the patient have an upcoming appointment? yes  Agent: Please be advised that RX refills may take up to 3 business days. We ask that you follow-up with your pharmacy.

## 2022-10-05 NOTE — Telephone Encounter (Signed)
Requested medication (s) are due for refill today: amlodipine expired  07/12/22              phentermine: 07/04/22 #30  Requested medication (s) are on the active medication list: amlodipine: no        phentermine: yes  Last refill: amlodipine: 04/13/22                phentermine: 07/04/22  Future visit scheduled:no  Notes to clinic:  amlodipine: expired                   phentermine: med not delegated to NT to RF   Requested Prescriptions  Pending Prescriptions Disp Refills   amLODipine (NORVASC) 5 MG tablet 90 tablet 0    Sig: Take 1 tablet (5 mg total) by mouth daily.     Cardiovascular: Calcium Channel Blockers 2 Failed - 10/03/2022  5:23 PM      Failed - Last BP in normal range    BP Readings from Last 1 Encounters:  06/23/22 (!) 176/94         Passed - Last Heart Rate in normal range    Pulse Readings from Last 1 Encounters:  06/23/22 93         Passed - Valid encounter within last 6 months    Recent Outpatient Visits           4 months ago Annual physical exam   [facility] [facility], Amy J, NP   5 months ago Encounter for weight management   [place] [facility], Amy J, NP   7 months ago Primary hypertension   [place] [facility], [place], NP   1 year ago Primary hypertension   [place] [facility], Amy J, NP   1 year ago Primary hypertension   [place] [facility], Amy J, NP               phentermine 37.5 MG capsule 30 capsule 0    Sig: Take 1 capsule (37.5 mg total) by mouth every morning.     Not Delegated - Neurology: Anticonvulsants - Controlled - phentermine hydrochloride Failed - 10/03/2022  5:23 PM      Failed - This refill cannot be delegated      Failed - Last BP in normal range    BP Readings from Last 1 Encounters:  06/23/22 (!) 176/94         Failed - Weight completed in the last 3 months    Wt  Readings from Last 1 Encounters:  05/12/22 281 lb 3.2 oz (127.6 kg)         Passed - eGFR in normal range and within 360 days    GFR calc Af Amer  Date Value Ref Range Status  09/30/2019 >60 >60 mL/min Final   GFR, Estimated  Date Value Ref Range Status  07/08/2020 >60 >60 mL/min Final    Comment:    (NOTE) Calculated using the CKD-EPI Creatinine Equation (2021)    eGFR  Date Value Ref Range Status  02/10/2022 110 >59 mL/min/1.73 Final         Passed - Cr in normal range and within 360 days    Creat  Date Value Ref Range Status  04/27/2011 0.68 0.50 - 1.10 mg/dL Final   Creatinine, Ser  Date Value Ref Range Status  02/10/2022  0.62 0.57 - 1.00 mg/dL Final         Passed - Valid encounter within last 6 months    Recent Outpatient Visits           4 months ago Annual physical exam   [place] [facility], Amy J, NP   5 months ago Encounter for weight management   [place] [facility], Amy J, NP   7 months ago Primary hypertension   [place] [facility], [place], NP   1 year ago Primary hypertension   [place] [facility], [place], NP   1 year ago Primary hypertension   [place] [facility], Salomon Fick, NP

## 2022-10-13 ENCOUNTER — Other Ambulatory Visit: Payer: Self-pay | Admitting: *Deleted

## 2022-10-13 DIAGNOSIS — I10 Essential (primary) hypertension: Secondary | ICD-10-CM

## 2022-10-13 MED ORDER — AMLODIPINE BESYLATE 5 MG PO TABS: 5.0000 mg | ORAL_TABLET | Freq: Every day | ORAL | 0 refills | Status: DC

## 2022-10-13 NOTE — Telephone Encounter (Signed)
Patient has called wanting refills on medication

## 2022-10-19 ENCOUNTER — Ambulatory Visit
Admission: EM | Admit: 2022-10-19 | Discharge: 2022-10-19 | Disposition: A | Discharge status: Home / Self Care | Payer: Managed Care, Other (non HMO) | Attending: Physician Assistant | Admitting: Physician Assistant

## 2022-10-19 DIAGNOSIS — Z1152 Encounter for screening for COVID-19: Secondary | ICD-10-CM | POA: Diagnosis not present

## 2022-10-19 DIAGNOSIS — R519 Headache, unspecified: Secondary | ICD-10-CM

## 2022-10-19 MED ORDER — KETOROLAC TROMETHAMINE 30 MG/ML IJ SOLN
30.0000 mg | Freq: Once | INTRAMUSCULAR | Status: AC
  Administered 2022-10-19: 30 mg via INTRAMUSCULAR

## 2022-10-19 NOTE — Discharge Instructions (Signed)
[facility]  [address] [place], [place] [address] [phone]

## 2022-10-19 NOTE — ED Triage Notes (Signed)
"  I am going through a separation, this depression and ha is taking a tole on me". "With the ha I am having vision disturbance, it is blurry at times". No recent illness. No nausea. No vomiting.

## 2022-10-21 LAB — SARS CORONAVIRUS 2 (TAT 6-24 HRS): SARS Coronavirus 2: NEGATIVE

## 2022-10-23 ENCOUNTER — Ambulatory Visit (INDEPENDENT_AMBULATORY_CARE_PROVIDER_SITE_OTHER): Payer: Managed Care, Other (non HMO) | Admitting: Family

## 2022-10-23 ENCOUNTER — Encounter: Payer: Self-pay | Admitting: Family

## 2022-10-23 VITALS — BP 123/80 | HR 72 | Temp 98.4°F | Ht 62.0 in | Wt 269.0 lb

## 2022-10-23 DIAGNOSIS — Z7689 Persons encountering health services in other specified circumstances: Secondary | ICD-10-CM

## 2022-10-23 DIAGNOSIS — I10 Essential (primary) hypertension: Secondary | ICD-10-CM

## 2022-10-23 DIAGNOSIS — E669 Obesity, unspecified: Secondary | ICD-10-CM

## 2022-10-23 DIAGNOSIS — F419 Anxiety disorder, unspecified: Secondary | ICD-10-CM

## 2022-10-23 DIAGNOSIS — Z23 Encounter for immunization: Secondary | ICD-10-CM | POA: Diagnosis not present

## 2022-10-23 DIAGNOSIS — Z6841 Body Mass Index (BMI) 40.0 and over, adult: Secondary | ICD-10-CM

## 2022-10-23 DIAGNOSIS — R7303 Prediabetes: Secondary | ICD-10-CM

## 2022-10-23 DIAGNOSIS — F32A Depression, unspecified: Secondary | ICD-10-CM

## 2022-10-23 MED ORDER — PHENTERMINE HCL 15 MG PO CAPS: 15.0000 mg | ORAL_CAPSULE | ORAL | 0 refills | Status: DC

## 2022-10-23 MED ORDER — FLUOXETINE HCL 20 MG PO TABS: 20.0000 mg | ORAL_TABLET | Freq: Every day | ORAL | 0 refills | Status: DC

## 2022-10-23 MED ORDER — HYDROXYZINE PAMOATE 25 MG PO CAPS: 25.0000 mg | ORAL_CAPSULE | Freq: Three times a day (TID) | ORAL | 1 refills | Status: DC | PRN

## 2022-10-23 MED ORDER — PHENTERMINE HCL 37.5 MG PO CAPS: 37.5000 mg | ORAL_CAPSULE | ORAL | 0 refills | Status: DC

## 2022-10-23 NOTE — Progress Notes (Signed)
Patient ID: Devana Danyluk, female    DOB: [birth year]  MRN: [id number]  CC: Chronic Conditions Follow-Up  Subjective: Janice Terrell is a 47 y.o. female who presents for chronic conditions follow-up.   Her concerns today include:  - Doing well on Amlodipine, no issues/concerns. Home blood pressures at goal. She does not complain of red flag symptoms such as but not limited to chest pain, shortness of breath, worst headache of life, nausea/vomiting.  - States she would like to resume Phentermine.  - Anxiety depression related to states she is "going through separation" from her husband of 26 years. States she is established with a therapist who took her out of work for 60 days. She would like to begin anxiety depression medication. She denies thoughts of self-harm, suicidal ideations, homicidal ideations.   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 06/15/2021   Degenerative tear of medial meniscus of right knee 06/15/2021   Pain in joint of right elbow 04/15/2021   Triceps tendinitis 04/15/2021   Bacterial vaginitis 09/28/2020   Candida vaginitis 09/28/2020   Prediabetes 09/27/2020   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/24/2020   Screening for malignant neoplasm of colon 09/24/2020   Abnormal uterine bleeding 08/06/2020   Abnormal vaginal bleeding 08/06/2020   Pain in pelvis 08/06/2020   Closed fracture of coronoid process of left ulna 07/20/2020   Closed fracture of head of left radius 07/20/2020   Primary osteoarthritis of left knee 02/21/2019   Cyst of left ovary 08/28/2018   Irregular periods 11/17/2014   Obesity due to excess calories 04/27/2011     Current Outpatient Medications on File Prior to Visit  Medication Sig Dispense Refill   acetaminophen (TYLENOL) 500 MG tablet Take 1,000 mg by mouth every 6 (six) hours as needed for headache or moderate pain.     amLODipine (NORVASC) 5 MG tablet Take 1  tablet (5 mg total) by mouth daily. 30 tablet 0   cyclobenzaprine (FLEXERIL) 10 MG tablet Take 1 tablet (10 mg total) by mouth 2 (two) times daily as needed for muscle spasms. 14 tablet 0   Ascorbic Acid (VITAMIN C PO)  (Patient not taking: Reported on 10/23/2022)     Ascorbic Acid (VITAMIN C) 1000 MG tablet Take 1,000 mg by mouth daily. (Patient not taking: Reported on 10/23/2022)     No current facility-administered medications on file prior to visit.    No Known Allergies  Social History   Socioeconomic History   Marital status: Married    Spouse name: Not on file   Number of children: Not on file   Years of education: Not on file   Highest education level: Not on file  Occupational History   Not on file  Tobacco Use   Smoking status: Never    Passive exposure: Never   Smokeless tobacco: Never  Vaping Use   Vaping status: Never Used  Substance and Sexual Activity   Alcohol use: Not Currently    Comment: occasionally   Drug use: Never   Sexual activity: Not Currently    Birth control/protection: Surgical  Other Topics Concern   Not on file  Social History Narrative   Not on file   Social Determinants of Health   Financial Resource Strain: Not on file  Food Insecurity: Not on file  Transportation Needs: Not on file  Physical Activity: Not on file  Stress: Not on file  Social Connections: Not  on file  Intimate Partner Violence: Not on file    Family History  Problem Relation Age of Onset   Diabetes Sister    Diabetes Maternal Grandmother    Diabetes Other     Past Surgical History:  Procedure Laterality Date   ABDOMINAL HYSTERECTOMY     BREATH TEK H PYLORI  05/19/2011   Procedure: BREATH TEK H PYLORI;  Surgeon: Valarie Merino, MD;  Location: [facility];  Service: General;  Laterality: N/A;   CESAREAN SECTION  1998   COLONOSCOPY WITH PROPOFOL N/A 02/06/2020   Procedure: COLONOSCOPY WITH PROPOFOL;  Surgeon: Jeani Hawking, MD;  Location: [facility];   Service: Endoscopy;  Laterality: N/A;   CYSTOSCOPY  11/17/2014   Procedure: CYSTOSCOPY;  Surgeon: Jaymes Graff, MD;  Location: [facility];  Service: Gynecology;;   LAPAROSCOPIC LYSIS OF ADHESIONS N/A 08/28/2018   Procedure: Laparoscopic Lysis Of Adhesions with repair of serosal tear of colon;  Surgeon: Jaymes Graff, MD;  [facility]: [facility];  Service: Gynecology;  Laterality: N/A;   POLYPECTOMY  02/06/2020   Procedure: POLYPECTOMY;  Surgeon: Jeani Hawking, MD;  Location: [facility];  Service: Endoscopy;;   TUBAL LIGATION      ROS: Review of Systems Negative except as stated above  PHYSICAL EXAM: BP 123/80   Pulse 72   Temp 98.4 F (36.9 C) (Oral)   Ht 5\' 2"  (1.575 m)   Wt 269 lb (122 kg)   LMP 08/09/2014   SpO2 96%   BMI 49.20 kg/m   Physical Exam HENT:     Head: Normocephalic and atraumatic.     Nose: Nose normal.     Mouth/Throat:     Mouth: Mucous membranes are moist.     Pharynx: Oropharynx is clear.  Eyes:     Extraocular Movements: Extraocular movements intact.     Conjunctiva/sclera: Conjunctivae normal.     Pupils: Pupils are equal, round, and reactive to light.  Cardiovascular:     Rate and Rhythm: Normal rate and regular rhythm.     Pulses: Normal pulses.     Heart sounds: Normal heart sounds.  Pulmonary:     Effort: Pulmonary effort is normal.     Breath sounds: Normal breath sounds.  Musculoskeletal:        General: Normal range of motion.     Cervical back: Normal range of motion and neck supple.  Neurological:     General: No focal deficit present.     Mental Status: She is alert and oriented to person, place, and time.  Psychiatric:        Mood and Affect: Affect is tearful.     ASSESSMENT AND PLAN: 1. Primary hypertension - Continue Amlodipine as prescribed. No refills needed as of present.  - Routine screening.  - Counseled on blood pressure goal of less than 130/80, low-sodium, DASH diet, medication compliance, and 150 minutes of moderate  intensity exercise per week as tolerated. Counseled on medication adherence and adverse effects. - Follow-up with primary provider in 3 months or sooner if needed.  - Basic Metabolic Panel  2. Prediabetes - Routine screening.  - Hemoglobin A1c  3. Anxiety and depression - Patient denies thoughts of self-harm, suicidal ideations, homicidal ideations. - Fluoxetine and Hydroxyzine as prescribed. Counseled on medication adherence/adverse effects.  - Keep all scheduled appointments with therapist.  - Follow-up with primary provider in 4 weeks or sooner if needed.  - FLUoxetine (PROZAC) 20 MG tablet; Take 1 tablet (20 mg total) by mouth  daily.  Dispense: 30 tablet; Refill: 0 - hydrOXYzine (VISTARIL) 25 MG capsule; Take 1 capsule (25 mg total) by mouth every 8 (eight) hours as needed.  Dispense: 30 capsule; Refill: 1  4. Encounter for weight management 5. BMI 45.0-49.9, adult (HCC) - Phentermine as prescribed. Counseled on medication adherence and adverse effects. Patient verbalized understanding.  - I did check the [facility] prescription drug database. - Counseled on low-sodium DASH diet and 150 minutes of moderate intensity exercise per week as tolerated to assist with weight management.  - Follow-up with primary provider in 4 weeks or sooner if needed. - phentermine 15 MG capsule; Take 1 capsule (15 mg total) by mouth every morning.  Dispense: 30 capsule; Refill: 0  6. Immunization due - Administered.  - Flu vaccine trivalent PF, 6mos and older(Flulaval,Afluria,Fluarix,Fluzone)   Patient was given the opportunity to ask questions.  Patient verbalized understanding of the plan and was able to repeat key elements of the plan. Patient was given clear instructions to go to Emergency Department or return to medical center if symptoms don't improve, worsen, or new problems develop.The patient verbalized understanding.   Orders Placed This Encounter  Procedures   Flu vaccine trivalent PF,  6mos and older(Flulaval,Afluria,Fluarix,Fluzone)   Basic Metabolic Panel   Hemoglobin A1c     Requested Prescriptions   Signed Prescriptions Disp Refills   FLUoxetine (PROZAC) 20 MG tablet 30 tablet 0    Sig: Take 1 tablet (20 mg total) by mouth daily.   hydrOXYzine (VISTARIL) 25 MG capsule 30 capsule 1    Sig: Take 1 capsule (25 mg total) by mouth every 8 (eight) hours as needed.   phentermine 15 MG capsule 30 capsule 0    Sig: Take 1 capsule (15 mg total) by mouth every morning.    Return in about 3 months (around 01/22/2023) for Follow-Up or next available chronic conditions and 4 weeks anixety, depression, weight.  Rema Fendt, NP

## 2022-10-23 NOTE — Progress Notes (Signed)
Patient wants to talk about something to control the depression. And phentermine.  Patient is interested in getting flu vaccine.

## 2022-10-24 LAB — BASIC METABOLIC PANEL
BUN/Creatinine Ratio: 17 (ref 9–23)
BUN: 10 mg/dL (ref 6–24)
CO2: 23 mmol/L (ref 20–29)
Calcium: 9.1 mg/dL (ref 8.7–10.2)
Chloride: 104 mmol/L (ref 96–106)
Creatinine, Ser: 0.59 mg/dL (ref 0.57–1.00)
Glucose: 94 mg/dL (ref 70–99)
Potassium: 4.5 mmol/L (ref 3.5–5.2)
Sodium: 140 mmol/L (ref 134–144)
eGFR: 111 mL/min/{1.73_m2} (ref >59)

## 2022-10-24 LAB — HEMOGLOBIN A1C
Est. average glucose Bld gHb Est-mCnc: 134 mg/dL
Hgb A1c MFr Bld: 6.3 % — ABNORMAL HIGH (ref 4.8–5.6)

## 2022-10-25 ENCOUNTER — Telehealth: Payer: Self-pay

## 2022-10-25 NOTE — Telephone Encounter (Signed)
Good after noon,  Can you help me out with this prior auth for Phentermine HCI 15MG  Capsules

## 2022-10-26 ENCOUNTER — Other Ambulatory Visit: Payer: Self-pay

## 2022-10-27 NOTE — ED Provider Notes (Signed)
[facility]    CSN: [id number] Arrival date & time: 10/19/22  1702      History   Chief Complaint Chief Complaint  Patient presents with   Headache   Depression    HPI Janice Terrell is a 47 y.o. female.   Patient here today for evaluation of headache that has been ongoing for the last several days.  She reports that she has not had recent illness.  She denies any nausea or vomiting.  She states she is currently going through a separation and feels like the depression is making headache worse.  She has not had any thoughts of suicide or self-harm.  She does have mild fever in office.  The history is provided by the patient.  Headache Associated symptoms: no abdominal pain, no congestion, no fever, no nausea and no vomiting   Depression Associated symptoms include headaches. Pertinent negatives include no abdominal pain and no shortness of breath.    Past Medical History:  Diagnosis Date   Arthritis    Hypertension    Morbid obesity (HCC) 04/27/2011   Obesity, Class III, BMI 40-49.9 (morbid obesity) (HCC) 04/27/2011    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 06/15/2021   Degenerative tear of medial meniscus of right knee 06/15/2021   Pain in joint of right elbow 04/15/2021   Triceps tendinitis 04/15/2021   Bacterial vaginitis 09/28/2020   Candida vaginitis 09/28/2020   Prediabetes 09/27/2020   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/24/2020   Screening for malignant neoplasm of colon 09/24/2020   Abnormal uterine bleeding 08/06/2020   Abnormal vaginal bleeding 08/06/2020   Pain in pelvis 08/06/2020   Closed fracture of coronoid process of left ulna 07/20/2020   Closed fracture of head of left radius 07/20/2020   Primary osteoarthritis of left knee 02/21/2019   Cyst of left ovary 08/28/2018   Irregular periods 11/17/2014   Obesity due to excess calories 04/27/2011    Past  Surgical History:  Procedure Laterality Date   ABDOMINAL HYSTERECTOMY     BREATH TEK H PYLORI  05/19/2011   Procedure: BREATH TEK H PYLORI;  Surgeon: Valarie Merino, MD;  Location: [facility];  Service: General;  Laterality: N/A;   CESAREAN SECTION  1998   COLONOSCOPY WITH PROPOFOL N/A 02/06/2020   Procedure: COLONOSCOPY WITH PROPOFOL;  Surgeon: Jeani Hawking, MD;  Location: [facility];  Service: Endoscopy;  Laterality: N/A;   CYSTOSCOPY  11/17/2014   Procedure: CYSTOSCOPY;  Surgeon: Jaymes Graff, MD;  Location: [facility];  Service: Gynecology;;   LAPAROSCOPIC LYSIS OF ADHESIONS N/A 08/28/2018   Procedure: Laparoscopic Lysis Of Adhesions with repair of serosal tear of colon;  Surgeon: Jaymes Graff, MD;  Location: [facility];  Service: Gynecology;  Laterality: N/A;   POLYPECTOMY  02/06/2020   Procedure: POLYPECTOMY;  Surgeon: Jeani Hawking, MD;  Location: [facility];  Service: Endoscopy;;   TUBAL LIGATION      OB History   No obstetric history on file.      Home Medications    Prior to Admission medications   Medication Sig Start Date End Date Taking? Authorizing Provider  amLODipine (NORVASC) 5 MG tablet Take 1 tablet (5 mg total) by mouth daily. 10/13/22 01/11/23 Yes Zonia Kief, Amy J, NP  acetaminophen (TYLENOL) 500 MG tablet Take 1,000 mg by mouth every 6 (six) hours as needed for headache or moderate pain.    [provider]  Ascorbic Acid (  VITAMIN C PO)     [provider]  Ascorbic Acid (VITAMIN C) 1000 MG tablet Take 1,000 mg by mouth daily. Patient not taking: Reported on 10/23/2022    [provider]  cyclobenzaprine (FLEXERIL) 10 MG tablet Take 1 tablet (10 mg total) by mouth 2 (two) times daily as needed for muscle spasms. 06/23/22   Renne Crigler, PA-C  FLUoxetine (PROZAC) 20 MG tablet Take 1 tablet (20 mg total) by mouth daily. 10/23/22   Rema Fendt, NP  hydrOXYzine (VISTARIL) 25 MG capsule Take 1 capsule (25 mg total) by mouth every 8 (eight)  hours as needed. 10/23/22   Rema Fendt, NP  phentermine 15 MG capsule Take 1 capsule (15 mg total) by mouth every morning. 10/23/22   Rema Fendt, NP    Family History Family History  Problem Relation Age of Onset   Diabetes Sister    Diabetes Maternal Grandmother    Diabetes Other     Social History Social History   Tobacco Use   Smoking status: Never    Passive exposure: Never   Smokeless tobacco: Never  Vaping Use   Vaping status: Never Used  Substance Use Topics   Alcohol use: Not Currently    Comment: occasionally   Drug use: Never     Allergies   Patient has no known allergies.   Review of Systems Review of Systems  Constitutional:  Negative for chills and fever.  HENT:  Negative for congestion.   Eyes:  Negative for discharge and redness.  Respiratory:  Negative for shortness of breath.   Gastrointestinal:  Negative for abdominal pain, nausea and vomiting.  Neurological:  Positive for headaches.  Psychiatric/Behavioral:  Positive for depression.      Physical Exam Triage Vital Signs ED Triage Vitals  Encounter Vitals Group     BP 10/19/22 1830 138/82     Systolic BP Percentile --      Diastolic BP Percentile --      Pulse Rate 10/19/22 1830 80     Resp 10/19/22 1830 20     Temp 10/19/22 1830 99.6 F (37.6 C)     Temp Source 10/19/22 1830 Oral     SpO2 10/19/22 1830 97 %     Weight 10/19/22 1829 269 lb (122 kg)     Height 10/19/22 1829 5\' 2"  (1.575 m)     Head Circumference --      Peak Flow --      Pain Score 10/19/22 1824 7     Pain Loc --      Pain Education --      Exclude from Growth Chart --    No data found.  Updated Vital Signs BP 138/82 (BP Location: Left Arm)   Pulse 80   Temp 99.6 F (37.6 C) (Oral)   Resp 20   Ht 5\' 2"  (1.575 m)   Wt 269 lb (122 kg)   LMP 08/09/2014   SpO2 97%   BMI 49.20 kg/m   Physical Exam Vitals and nursing note reviewed.  Constitutional:      General: She is not in acute distress.     Appearance: Normal appearance. She is not ill-appearing.  HENT:     Head: Normocephalic and atraumatic.  Eyes:     Extraocular Movements: Extraocular movements intact.     Conjunctiva/sclera: Conjunctivae normal.     Pupils: Pupils are equal, round, and reactive to light.  Cardiovascular:     Rate and Rhythm:  Normal rate.  Pulmonary:     Effort: Pulmonary effort is normal. No respiratory distress.  Neurological:     Mental Status: She is alert and oriented to person, place, and time.     Comments: Normal finger-nose, normal heel-to-shin, no facial droop, normal speech  Psychiatric:        Mood and Affect: Mood normal.        Behavior: Behavior normal.        Thought Content: Thought content normal.      UC Treatments / Results  Labs (all labs ordered are listed, but only abnormal results are displayed) Labs Reviewed  SARS CORONAVIRUS 2 (TAT 6-24 HRS)    EKG   Radiology No results found.  Procedures Procedures (including critical care time)  Medications Ordered in UC Medications  ketorolac (TORADOL) 30 MG/ML injection 30 mg (30 mg Intramuscular Given 10/19/22 1959)    Initial Impression / Assessment and Plan / UC Course  I have reviewed the triage vital signs and the nursing notes.  Pertinent labs & imaging results that were available during my care of the patient were reviewed by me and considered in my medical decision making (see chart for details).    Toradol injection administered in office to hopefully break headache cycle.  Discussed need for further evaluation by behavioral health specialty.  Information provided for urgent care of same.  Encouraged her to follow-up with any further concerns.  Final Clinical Impressions(s) / UC Diagnoses   Final diagnoses:  Acute nonintractable headache, unspecified headache type     Discharge Instructions       [facility]  [address] [place], [place] [address]  [phone]      ED  Prescriptions   None    PDMP not reviewed this encounter.   Tomi Bamberger, PA-C 10/27/22 2023

## 2022-11-02 ENCOUNTER — Ambulatory Visit: Payer: Managed Care, Other (non HMO) | Admitting: Family

## 2022-11-07 ENCOUNTER — Other Ambulatory Visit: Payer: Self-pay

## 2022-11-09 ENCOUNTER — Telehealth: Payer: Self-pay

## 2022-11-09 ENCOUNTER — Other Ambulatory Visit: Payer: Self-pay

## 2022-11-09 NOTE — Telephone Encounter (Signed)
Pharmacy Patient Advocate Encounter  Received notification from [facility] that Prior Authorization for PHENTERMINE has been DENIED.  Full denial letter will be uploaded to the media tab. See denial reason below.   PA #/Case ID/Reference #: [id number]

## 2022-11-13 ENCOUNTER — Other Ambulatory Visit: Payer: Self-pay | Admitting: Family

## 2022-11-13 DIAGNOSIS — I10 Essential (primary) hypertension: Secondary | ICD-10-CM

## 2022-11-13 NOTE — Telephone Encounter (Signed)
Complete

## 2022-11-20 ENCOUNTER — Encounter: Payer: Self-pay | Admitting: Family

## 2022-11-20 ENCOUNTER — Other Ambulatory Visit: Payer: Self-pay | Admitting: Family

## 2022-11-20 ENCOUNTER — Telehealth: Payer: Self-pay

## 2022-11-20 ENCOUNTER — Ambulatory Visit (INDEPENDENT_AMBULATORY_CARE_PROVIDER_SITE_OTHER): Payer: Managed Care, Other (non HMO) | Admitting: Family

## 2022-11-20 DIAGNOSIS — F32A Depression, unspecified: Secondary | ICD-10-CM

## 2022-11-20 DIAGNOSIS — F419 Anxiety disorder, unspecified: Secondary | ICD-10-CM | POA: Diagnosis not present

## 2022-11-20 DIAGNOSIS — Z6841 Body Mass Index (BMI) 40.0 and over, adult: Secondary | ICD-10-CM

## 2022-11-20 DIAGNOSIS — E6609 Other obesity due to excess calories: Secondary | ICD-10-CM

## 2022-11-20 DIAGNOSIS — Z1231 Encounter for screening mammogram for malignant neoplasm of breast: Secondary | ICD-10-CM

## 2022-11-20 DIAGNOSIS — Z7689 Persons encountering health services in other specified circumstances: Secondary | ICD-10-CM

## 2022-11-20 MED ORDER — PHENTERMINE HCL 30 MG PO CAPS: 30.0000 mg | ORAL_CAPSULE | ORAL | 0 refills | Status: DC

## 2022-11-20 MED ORDER — HYDROXYZINE PAMOATE 25 MG PO CAPS: 25.0000 mg | ORAL_CAPSULE | Freq: Three times a day (TID) | ORAL | 2 refills | Status: DC | PRN

## 2022-11-20 MED ORDER — FLUOXETINE HCL 20 MG PO TABS: 20.0000 mg | ORAL_TABLET | Freq: Every day | ORAL | 2 refills | Status: DC

## 2022-11-20 NOTE — Progress Notes (Signed)
Patient states no other concerns to discuss.

## 2022-11-20 NOTE — Telephone Encounter (Signed)
Good afternoon Janice Terrell, can you help me with this prior auth please.  Phentermine HCI 30MG  Capsules.

## 2022-11-20 NOTE — Progress Notes (Signed)
Patient ID: Janice Terrell, female    DOB: 30-Jul-1975  MRN: [id number]  CC: Weight Check/Anxiety Depression Follow-Up  Subjective: Janice Terrell is a 47 y.o. female who presents for weight check/anxiety depression follow-up.   Her concerns today include:  - Doing well on Phentermine, no issues/concerns.  - Doing well on Fluoxetine and Hydroxyzine, no issues/concerns. States blood pressure elevated due to issues she had before she left home for today's office visit. She denies thoughts of self-harm, suicidal ideations, homicidal ideations.    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 06/15/2021   Degenerative tear of medial meniscus of right knee 06/15/2021   Pain in joint of right elbow 04/15/2021   Triceps tendinitis 04/15/2021   Bacterial vaginitis 09/28/2020   Candida vaginitis 09/28/2020   Prediabetes 09/27/2020   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/24/2020   Screening for malignant neoplasm of colon 09/24/2020   Abnormal uterine bleeding 08/06/2020   Abnormal vaginal bleeding 08/06/2020   Pain in pelvis 08/06/2020   Closed fracture of coronoid process of left ulna 07/20/2020   Closed fracture of head of left radius 07/20/2020   Primary osteoarthritis of left knee 02/21/2019   Cyst of left ovary 08/28/2018   Irregular periods 11/17/2014   Obesity due to excess calories 04/27/2011     Current Outpatient Medications on File Prior to Visit  Medication Sig Dispense Refill   amLODipine (NORVASC) 5 MG tablet TAKE 1 TABLET BY MOUTH ONCE DAILY . APPOINTMENT REQUIRED FOR FUTURE REFILLS 90 tablet 0   cyclobenzaprine (FLEXERIL) 10 MG tablet Take 1 tablet (10 mg total) by mouth 2 (two) times daily as needed for muscle spasms. 14 tablet 0   No current facility-administered medications on file prior to visit.    No Known Allergies  Social History   Socioeconomic History   Marital status: Married     Spouse name: Not on file   Number of children: Not on file   Years of education: Not on file   Highest education level: Not on file  Occupational History   Not on file  Tobacco Use   Smoking status: Never    Passive exposure: Never   Smokeless tobacco: Never  Vaping Use   Vaping status: Never Used  Substance and Sexual Activity   Alcohol use: Not Currently    Comment: occasionally   Drug use: Never   Sexual activity: Not Currently    Birth control/protection: Surgical  Other Topics Concern   Not on file  Social History Narrative   Not on file   Social Determinants of Health   Financial Resource Strain: Not on file  Food Insecurity: Not on file  Transportation Needs: Not on file  Physical Activity: Not on file  Stress: Not on file  Social Connections: Not on file  Intimate Partner Violence: Not on file    Family History  Problem Relation Age of Onset   Diabetes Sister    Diabetes Maternal Grandmother    Diabetes Other     Past Surgical History:  Procedure Laterality Date   ABDOMINAL HYSTERECTOMY     BREATH TEK H PYLORI  05/19/2011   Procedure: BREATH TEK H PYLORI;  Surgeon: Valarie Merino, MD;  Location: [facility];  Service: General;  Laterality: N/A;   CESAREAN SECTION  1998   COLONOSCOPY WITH PROPOFOL N/A 02/06/2020   Procedure: COLONOSCOPY WITH PROPOFOL;  Surgeon: Jeani Hawking, MD;  Location: [facility];  Service: Endoscopy;  Laterality: N/A;   CYSTOSCOPY  11/17/2014   Procedure: CYSTOSCOPY;  Surgeon: Jaymes Graff, MD;  Location: [facility];  Service: Gynecology;;   LAPAROSCOPIC LYSIS OF ADHESIONS N/A 08/28/2018   Procedure: Laparoscopic Lysis Of Adhesions with repair of serosal tear of colon;  Surgeon: Jaymes Graff, MD;  Location: [facility];  Service: Gynecology;  Laterality: N/A;   POLYPECTOMY  02/06/2020   Procedure: POLYPECTOMY;  Surgeon: Jeani Hawking, MD;  Location: [facility];  Service: Endoscopy;;   TUBAL LIGATION      ROS: Review of  Systems Negative except as stated above  PHYSICAL EXAM: BP (!) 141/88   Pulse 77   Temp 98.6 F (37 C) (Oral)   Ht 5\' 2"  (1.575 m)   Wt 260 lb 3.2 oz (118 kg)   LMP 08/09/2014   SpO2 95%   BMI 47.59 kg/m   Wt Readings from Last 3 Encounters:  11/20/22 260 lb 3.2 oz (118 kg)  10/23/22 269 lb (122 kg)  10/19/22 269 lb (122 kg)    Physical Exam HENT:     Head: Normocephalic and atraumatic.     Nose: Nose normal.     Mouth/Throat:     Mouth: Mucous membranes are moist.     Pharynx: Oropharynx is clear.  Eyes:     Extraocular Movements: Extraocular movements intact.     Conjunctiva/sclera: Conjunctivae normal.     Pupils: Pupils are equal, round, and reactive to light.  Cardiovascular:     Rate and Rhythm: Normal rate and regular rhythm.     Pulses: Normal pulses.     Heart sounds: Normal heart sounds.  Pulmonary:     Effort: Pulmonary effort is normal.     Breath sounds: Normal breath sounds.  Musculoskeletal:        General: Normal range of motion.     Cervical back: Normal range of motion and neck supple.  Neurological:     General: No focal deficit present.     Mental Status: She is alert and oriented to person, place, and time.  Psychiatric:        Mood and Affect: Mood normal.        Behavior: Behavior normal.     ASSESSMENT AND PLAN: 1. Encounter for weight management 2. BMI 45.0-49.9, adult [facility]) - Patient lost 9 pounds since previous office visit.  - Increase Phentermine from 15 mg daily to 30 mg daily. Counseled on medication adherence/adverse effects.  - Follow-up with primary provider in 4 weeks or sooner if needed for weight check. - phentermine 30 MG capsule; Take 1 capsule (30 mg total) by mouth every morning.  Dispense: 30 capsule; Refill: 0  3. Anxiety and depression - Patient denies thoughts of self-harm, suicidal ideations, homicidal ideations. - Continue Fluoxetine and Hydroxyzine as prescribed. Counseled on medication adherence/adverse  effects.  - Keep all scheduled appointments with therapist.  - Follow-up with primary provider in 3 months or sooner if needed.  - FLUoxetine (PROZAC) 20 MG tablet; Take 1 tablet (20 mg total) by mouth daily.  Dispense: 30 tablet; Refill: 2 - hydrOXYzine (VISTARIL) 25 MG capsule; Take 1 capsule (25 mg total) by mouth every 8 (eight) hours as needed.  Dispense: 30 capsule; Refill: 2    Patient was given the opportunity to ask questions.  Patient verbalized understanding of the plan and was able to repeat key elements of the plan. Patient was given clear instructions to go to Emergency Department or return to  medical center if symptoms don't improve, worsen, or new problems develop.The patient verbalized understanding.    Requested Prescriptions   Signed Prescriptions Disp Refills   phentermine 30 MG capsule 30 capsule 0    Sig: Take 1 capsule (30 mg total) by mouth every morning.   FLUoxetine (PROZAC) 20 MG tablet 30 tablet 2    Sig: Take 1 tablet (20 mg total) by mouth daily.   hydrOXYzine (VISTARIL) 25 MG capsule 30 capsule 2    Sig: Take 1 capsule (25 mg total) by mouth every 8 (eight) hours as needed.    Return in about 4 weeks (around 12/18/2022) for Follow-Up or next available weight check .  Rema Fendt, NP

## 2022-11-21 ENCOUNTER — Ambulatory Visit
Admission: RE | Admit: 2022-11-21 | Discharge: 2022-11-21 | Disposition: A | Discharge status: Home / Self Care | Payer: Managed Care, Other (non HMO) | Source: Ambulatory Visit | Attending: Family | Admitting: Family

## 2022-11-21 ENCOUNTER — Other Ambulatory Visit: Payer: Self-pay

## 2022-11-21 DIAGNOSIS — Z1231 Encounter for screening mammogram for malignant neoplasm of breast: Secondary | ICD-10-CM

## 2022-12-21 ENCOUNTER — Encounter: Payer: Self-pay | Admitting: Family

## 2022-12-21 ENCOUNTER — Encounter (INDEPENDENT_AMBULATORY_CARE_PROVIDER_SITE_OTHER): Payer: Managed Care, Other (non HMO) | Admitting: Family

## 2022-12-21 ENCOUNTER — Ambulatory Visit (INDEPENDENT_AMBULATORY_CARE_PROVIDER_SITE_OTHER): Payer: Managed Care, Other (non HMO) | Admitting: Family

## 2022-12-21 VITALS — BP 127/87 | HR 86 | Temp 98.1°F | Ht 62.0 in | Wt 257.0 lb

## 2022-12-21 DIAGNOSIS — E66813 Obesity, class 3: Secondary | ICD-10-CM | POA: Diagnosis not present

## 2022-12-21 DIAGNOSIS — E662 Morbid (severe) obesity with alveolar hypoventilation: Secondary | ICD-10-CM

## 2022-12-21 DIAGNOSIS — Z7689 Persons encountering health services in other specified circumstances: Secondary | ICD-10-CM

## 2022-12-21 DIAGNOSIS — Z6841 Body Mass Index (BMI) 40.0 and over, adult: Secondary | ICD-10-CM

## 2022-12-21 DIAGNOSIS — F419 Anxiety disorder, unspecified: Secondary | ICD-10-CM | POA: Diagnosis not present

## 2022-12-21 DIAGNOSIS — F32A Depression, unspecified: Secondary | ICD-10-CM | POA: Diagnosis not present

## 2022-12-21 MED ORDER — FLUOXETINE HCL 40 MG PO CAPS
40.0000 mg | ORAL_CAPSULE | Freq: Every day | ORAL | 1 refills | Status: DC
Start: 2022-12-21 — End: 2023-02-13

## 2022-12-21 MED ORDER — HYDROXYZINE PAMOATE 50 MG PO CAPS: 50.0000 mg | ORAL_CAPSULE | Freq: Three times a day (TID) | ORAL | 1 refills | Status: DC | PRN

## 2022-12-21 MED ORDER — PHENTERMINE HCL 37.5 MG PO CAPS: 37.5000 mg | ORAL_CAPSULE | ORAL | 0 refills | Status: DC

## 2022-12-21 NOTE — Progress Notes (Signed)
Erroneous encounter-disregard

## 2022-12-21 NOTE — Progress Notes (Signed)
Patient states her therapist says she needs her depression medication dose increased.

## 2022-12-21 NOTE — Progress Notes (Signed)
Patient ID: Janice Terrell, female    DOB: 08/23/1975  MRN: [id number]  CC: Anxiety Depression Follow-Up  Subjective: Janice Terrell is a 47 y.o. female who presents for anxiety depression follow-up.   Her concerns today include:  - States her therapist recommended Fluoxetine and Hydroxyzine increase. Otherwise doing well on regimen, no issues/concerns. She denies thoughts of self-harm, suicidal ideations, homicidal ideations. - Doing well on Phentermine, no issues/concerns.    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 06/15/2021   Degenerative tear of medial meniscus of right knee 06/15/2021   Pain in joint of right elbow 04/15/2021   Triceps tendinitis 04/15/2021   Bacterial vaginitis 09/28/2020   Candida vaginitis 09/28/2020   Prediabetes 09/27/2020   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/24/2020   Screening for malignant neoplasm of colon 09/24/2020   Abnormal uterine bleeding 08/06/2020   Abnormal vaginal bleeding 08/06/2020   Pain in pelvis 08/06/2020   Closed fracture of coronoid process of left ulna 07/20/2020   Closed fracture of head of left radius 07/20/2020   Primary osteoarthritis of left knee 02/21/2019   Cyst of left ovary 08/28/2018   Irregular periods 11/17/2014   Obesity due to excess calories 04/27/2011     Current Outpatient Medications on File Prior to Visit  Medication Sig Dispense Refill   amLODipine (NORVASC) 5 MG tablet TAKE 1 TABLET BY MOUTH ONCE DAILY . APPOINTMENT REQUIRED FOR FUTURE REFILLS 90 tablet 0   cyclobenzaprine (FLEXERIL) 10 MG tablet Take 1 tablet (10 mg total) by mouth 2 (two) times daily as needed for muscle spasms. 14 tablet 0   No current facility-administered medications on file prior to visit.    No Known Allergies  Social History   Socioeconomic History   Marital status: Married    Spouse name: Not on file   Number of children: Not on file   Years of  education: Not on file   Highest education level: Not on file  Occupational History   Not on file  Tobacco Use   Smoking status: Never    Passive exposure: Never   Smokeless tobacco: Never  Vaping Use   Vaping status: Never Used  Substance and Sexual Activity   Alcohol use: Not Currently    Comment: occasionally   Drug use: Never   Sexual activity: Not Currently    Birth control/protection: Surgical  Other Topics Concern   Not on file  Social History Narrative   Not on file   Social Determinants of Health   Financial Resource Strain: Not on file  Food Insecurity: Not on file  Transportation Needs: Not on file  Physical Activity: Not on file  Stress: Not on file  Social Connections: Not on file  Intimate Partner Violence: Not on file    Family History  Problem Relation Age of Onset   Diabetes Sister    Diabetes Maternal Grandmother    Diabetes Other    Breast cancer Neg Hx     Past Surgical History:  Procedure Laterality Date   ABDOMINAL HYSTERECTOMY     BREATH TEK H PYLORI  05/19/2011   Procedure: BREATH TEK H PYLORI;  Surgeon: Valarie Merino, MD;  Location: [facility];  Service: General;  Laterality: N/A;   CESAREAN SECTION  1998   COLONOSCOPY WITH PROPOFOL N/A 02/06/2020   Procedure: COLONOSCOPY WITH PROPOFOL;  Surgeon: Jeani Hawking, MD;  Location: [facility];  Service: Endoscopy;  Laterality:  N/A;   CYSTOSCOPY  11/17/2014   Procedure: CYSTOSCOPY;  Surgeon: Jaymes Graff, MD;  Location: [facility];  Service: Gynecology;;   LAPAROSCOPIC LYSIS OF ADHESIONS N/A 08/28/2018   Procedure: Laparoscopic Lysis Of Adhesions with repair of serosal tear of colon;  Surgeon: Jaymes Graff, MD;  Location: [facility];  Service: Gynecology;  Laterality: N/A;   POLYPECTOMY  02/06/2020   Procedure: POLYPECTOMY;  Surgeon: Jeani Hawking, MD;  Location: [facility];  Service: Endoscopy;;   TUBAL LIGATION      ROS: Review of Systems Negative except as stated above  PHYSICAL EXAM: BP  127/87   Pulse 86   Temp 98.1 F (36.7 C) (Oral)   Ht 5\' 2"  (1.575 m)   Wt 257 lb (116.6 kg)   LMP 08/09/2014   SpO2 95%   BMI 47.01 kg/m   Physical Exam HENT:     Head: Normocephalic and atraumatic.     Nose: Nose normal.     Mouth/Throat:     Mouth: Mucous membranes are moist.     Pharynx: Oropharynx is clear.  Eyes:     Extraocular Movements: Extraocular movements intact.     Conjunctiva/sclera: Conjunctivae normal.     Pupils: Pupils are equal, round, and reactive to light.  Cardiovascular:     Rate and Rhythm: Normal rate and regular rhythm.     Pulses: Normal pulses.     Heart sounds: Normal heart sounds.  Pulmonary:     Effort: Pulmonary effort is normal.     Breath sounds: Normal breath sounds.  Musculoskeletal:        General: Normal range of motion.     Cervical back: Normal range of motion and neck supple.  Neurological:     General: No focal deficit present.     Mental Status: She is alert and oriented to person, place, and time.  Psychiatric:        Mood and Affect: Mood normal.        Behavior: Behavior normal.     ASSESSMENT AND PLAN: 1. Anxiety and depression - Patient denies thoughts of self-harm, suicidal ideations, homicidal ideations. - Increase Fluoxetine from 20 mg to 40 mg. Counseled on medication adherence/adverse effects. - Increase Hydroxyzine from 25 mg to 50 mg. Counseled on medication adherence/adverse effects. - Keep all scheduled appointments with therapist.  - Follow-up with primary provider in 4 weeks or sooner if needed.  - FLUoxetine (PROZAC) 40 MG capsule; Take 1 capsule (40 mg total) by mouth daily.  Dispense: 30 capsule; Refill: 1 - hydrOXYzine (VISTARIL) 50 MG capsule; Take 1 capsule (50 mg total) by mouth every 8 (eight) hours as needed.  Dispense: 30 capsule; Refill: 1  2. Encounter for weight management 3. BMI 45.0-49.9, adult (HCC) - Increase Phentermine from 30 mg to 37.5 mg. Counseled on medication adherence/adverse  effects.  - Follow-up with primary provider in 4 weeks or sooner if needed.  - phentermine 37.5 MG capsule; Take 1 capsule (37.5 mg total) by mouth every morning.  Dispense: 30 capsule; Refill: 0    Patient was given the opportunity to ask questions.  Patient verbalized understanding of the plan and was able to repeat key elements of the plan. Patient was given clear instructions to go to Emergency Department or return to medical center if symptoms don't improve, worsen, or new problems develop.The patient verbalized understanding.    Requested Prescriptions   Signed Prescriptions Disp Refills   FLUoxetine (PROZAC) 40 MG capsule 30 capsule 1  Sig: Take 1 capsule (40 mg total) by mouth daily.   hydrOXYzine (VISTARIL) 50 MG capsule 30 capsule 1    Sig: Take 1 capsule (50 mg total) by mouth every 8 (eight) hours as needed.   phentermine 37.5 MG capsule 30 capsule 0    Sig: Take 1 capsule (37.5 mg total) by mouth every morning.    Return in about 4 weeks (around 01/18/2023) for Follow-Up or next available chronic conditions.  Rema Fendt, NP

## 2023-01-04 ENCOUNTER — Telehealth: Payer: Self-pay | Admitting: Family

## 2023-01-04 NOTE — Telephone Encounter (Signed)
Medication Refill -  Most Recent Primary Care Visit:  Provider: Ricky Stabs J  Department: [facility]  Visit Type: OFFICE VISIT  Date: 12/21/2022  Medication: phentermine 37.5 MG capsule [[id number]]   Pt is calling to report that the pharmacy did not receive this medication.  Has the patient contacted their pharmacy? Yes (Agent: If no, request that the patient contact the pharmacy for the refill. If patient does not wish to contact the pharmacy document the reason why and proceed with request.) (Agent: If yes, when and what did the pharmacy advise?)  Is this the correct pharmacy for this prescription? Yes If no, delete pharmacy and type the correct one.  This is the patient's preferred pharmacy:  [facility] [address] ([address]), [place] - [address] [place] ([place]) [place] [address] Phone: [phone] Fax: [phone]    Has the prescription been filled recently? Yes  Is the patient out of the medication? No  Has the patient been seen for an appointment in the last year OR does the patient have an upcoming appointment? Yes  Can we respond through MyChart? No  Agent: Please be advised that Rx refills may take up to 3 business days. We ask that you follow-up with your pharmacy.

## 2023-01-22 ENCOUNTER — Ambulatory Visit (INDEPENDENT_AMBULATORY_CARE_PROVIDER_SITE_OTHER): Payer: Managed Care, Other (non HMO) | Admitting: Family

## 2023-01-22 ENCOUNTER — Ambulatory Visit: Payer: Managed Care, Other (non HMO) | Admitting: Family

## 2023-01-22 VITALS — BP 113/72 | HR 73 | Temp 98.4°F | Ht 62.0 in | Wt 255.8 lb

## 2023-01-22 DIAGNOSIS — F419 Anxiety disorder, unspecified: Secondary | ICD-10-CM | POA: Diagnosis not present

## 2023-01-22 DIAGNOSIS — Z7689 Persons encountering health services in other specified circumstances: Secondary | ICD-10-CM | POA: Diagnosis not present

## 2023-01-22 DIAGNOSIS — Z6841 Body Mass Index (BMI) 40.0 and over, adult: Secondary | ICD-10-CM | POA: Diagnosis not present

## 2023-01-22 DIAGNOSIS — F32A Depression, unspecified: Secondary | ICD-10-CM | POA: Diagnosis not present

## 2023-01-22 MED ORDER — HYDROXYZINE PAMOATE 50 MG PO CAPS: 50.0000 mg | ORAL_CAPSULE | Freq: Three times a day (TID) | ORAL | 1 refills | Status: DC | PRN

## 2023-01-22 MED ORDER — PHENTERMINE HCL 37.5 MG PO CAPS: 37.5000 mg | ORAL_CAPSULE | ORAL | 0 refills | Status: DC

## 2023-01-22 NOTE — Progress Notes (Signed)
Patient ID: Janice Terrell, female    DOB: 03-21-1975  MRN: [id number]  CC: Chronic Conditions Follow-Up  Subjective: Janice Terrell is a 47 y.o. female who presents for chronic conditions follow-up.   Her concerns today include:  - Doing well on Fluoxetine and Hydroxyzine, no issues/concerns. Established with therapist. She denies thoughts of self-harm, suicidal ideations, homicidal ideations. - States since previous office visit did not pickup Phentermine from pharmacy due to being unavailable.   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 06/15/2021   Degenerative tear of medial meniscus of right knee 06/15/2021   Pain in joint of right elbow 04/15/2021   Triceps tendinitis 04/15/2021   Bacterial vaginitis 09/28/2020   Candida vaginitis 09/28/2020   Prediabetes 09/27/2020   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/24/2020   Screening for malignant neoplasm of colon 09/24/2020   Abnormal uterine bleeding 08/06/2020   Abnormal vaginal bleeding 08/06/2020   Pain in pelvis 08/06/2020   Closed fracture of coronoid process of left ulna 07/20/2020   Closed fracture of head of left radius 07/20/2020   Primary osteoarthritis of left knee 02/21/2019   Cyst of left ovary 08/28/2018   Irregular periods 11/17/2014   Obesity due to excess calories 04/27/2011     Current Outpatient Medications on File Prior to Visit  Medication Sig Dispense Refill   amLODipine (NORVASC) 5 MG tablet TAKE 1 TABLET BY MOUTH ONCE DAILY . APPOINTMENT REQUIRED FOR FUTURE REFILLS 90 tablet 0   cyclobenzaprine (FLEXERIL) 10 MG tablet Take 1 tablet (10 mg total) by mouth 2 (two) times daily as needed for muscle spasms. 14 tablet 0   FLUoxetine (PROZAC) 40 MG capsule Take 1 capsule (40 mg total) by mouth daily. 30 capsule 1   No current facility-administered medications on file prior to visit.    No Known Allergies  Social History    Socioeconomic History   Marital status: Married    Spouse name: Not on file   Number of children: Not on file   Years of education: Not on file   Highest education level: Not on file  Occupational History   Not on file  Tobacco Use   Smoking status: Never    Passive exposure: Never   Smokeless tobacco: Never  Vaping Use   Vaping status: Never Used  Substance and Sexual Activity   Alcohol use: Not Currently    Comment: occasionally   Drug use: Never   Sexual activity: Not Currently    Birth control/protection: Surgical  Other Topics Concern   Not on file  Social History Narrative   Not on file   Social Drivers of Health   Financial Resource Strain: Not on file  Food Insecurity: Not on file  Transportation Needs: Not on file  Physical Activity: Not on file  Stress: Not on file  Social Connections: Not on file  Intimate Partner Violence: Not on file    Family History  Problem Relation Age of Onset   Diabetes Sister    Diabetes Maternal Grandmother    Diabetes Other    Breast cancer Neg Hx     Past Surgical History:  Procedure Laterality Date   ABDOMINAL HYSTERECTOMY     BREATH TEK H PYLORI  05/19/2011   Procedure: BREATH TEK H PYLORI;  Surgeon: Valarie Merino, MD;  Location: [facility];  Service: General;  Laterality: N/A;   CESAREAN SECTION  1998   COLONOSCOPY WITH  PROPOFOL N/A 02/06/2020   Procedure: COLONOSCOPY WITH PROPOFOL;  Surgeon: Jeani Hawking, MD;  Location: [facility];  Service: Endoscopy;  Laterality: N/A;   CYSTOSCOPY  11/17/2014   Procedure: CYSTOSCOPY;  Surgeon: Jaymes Graff, MD;  Location: [facility];  Service: Gynecology;;   LAPAROSCOPIC LYSIS OF ADHESIONS N/A 08/28/2018   Procedure: Laparoscopic Lysis Of Adhesions with repair of serosal tear of colon;  Surgeon: Jaymes Graff, MD;  Location: [facility];  Service: Gynecology;  Laterality: N/A;   POLYPECTOMY  02/06/2020   Procedure: POLYPECTOMY;  Surgeon: Jeani Hawking, MD;  Location: [facility];   Service: Endoscopy;;   TUBAL LIGATION      ROS: Review of Systems Negative except as stated above  PHYSICAL EXAM: BP 113/72   Pulse 73   Temp 98.4 F (36.9 C) (Oral)   Ht 5\' 2"  (1.575 m)   Wt 255 lb 12.8 oz (116 kg)   LMP 08/09/2014   SpO2 97%   BMI 46.79 kg/m   Wt Readings from Last 3 Encounters:  01/22/23 255 lb 12.8 oz (116 kg)  12/21/22 257 lb (116.6 kg)  11/20/22 260 lb 3.2 oz (118 kg)    Physical Exam HENT:     Head: Normocephalic and atraumatic.     Nose: Nose normal.     Mouth/Throat:     Mouth: Mucous membranes are moist.     Pharynx: Oropharynx is clear.  Eyes:     Extraocular Movements: Extraocular movements intact.     Conjunctiva/sclera: Conjunctivae normal.     Pupils: Pupils are equal, round, and reactive to light.  Cardiovascular:     Rate and Rhythm: Normal rate and regular rhythm.     Pulses: Normal pulses.     Heart sounds: Normal heart sounds.  Pulmonary:     Effort: Pulmonary effort is normal.     Breath sounds: Normal breath sounds.  Musculoskeletal:        General: Normal range of motion.     Cervical back: Normal range of motion and neck supple.  Neurological:     General: No focal deficit present.     Mental Status: She is alert and oriented to person, place, and time.  Psychiatric:        Mood and Affect: Mood normal.        Behavior: Behavior normal.     ASSESSMENT AND PLAN: 1. Anxiety and depression (Primary) - Patient denies thoughts of self-harm, suicidal ideations, homicidal ideations. - Continue Fluoxetine as prescribed. Counseled on medication adherence/adverse effects. No refills needed as of present. - Continue Hydroxyzine as prescribed. Counseled on medication adherence/adverse effects.  - Keep all scheduled appointments with therapist. - Follow-up with primary provider in 3 months or sooner if needed.  - hydrOXYzine (VISTARIL) 50 MG capsule; Take 1 capsule (50 mg total) by mouth every 8 (eight) hours as needed.   Dispense: 30 capsule; Refill: 1  2. Encounter for weight management 3. BMI 45.0-49.9, adult (HCC) - Continue Phentermine as prescribed. Counseled on medication adherence/adverse effects.  - I did check the [facility] prescription drug database. - Follow-up with primary provider in 4 weeks or sooner if needed.  - phentermine 37.5 MG capsule; Take 1 capsule (37.5 mg total) by mouth every morning.  Dispense: 30 capsule; Refill: 0   Patient was given the opportunity to ask questions.  Patient verbalized understanding of the plan and was able to repeat key elements of the plan. Patient was given clear instructions to go to Emergency Department or  return to medical center if symptoms don't improve, worsen, or new problems develop.The patient verbalized understanding.    Requested Prescriptions   Signed Prescriptions Disp Refills   phentermine 37.5 MG capsule 30 capsule 0    Sig: Take 1 capsule (37.5 mg total) by mouth every morning.   hydrOXYzine (VISTARIL) 50 MG capsule 30 capsule 1    Sig: Take 1 capsule (50 mg total) by mouth every 8 (eight) hours as needed.    Return in about 3 months (around 04/22/2023) for Follow-Up or next available chronic conditions and 4 weeks weight check.  Rema Fendt, NP

## 2023-01-22 NOTE — Progress Notes (Signed)
Patient states she hasn't got her phentermine prescription.

## 2023-01-23 ENCOUNTER — Other Ambulatory Visit: Payer: Self-pay

## 2023-01-23 ENCOUNTER — Telehealth: Payer: Self-pay

## 2023-01-23 NOTE — Telephone Encounter (Signed)
Good morning Janice Terrell can you give me a hand with this prior auth please.  Phentermine HCI 37.5MG  Capsules

## 2023-01-26 ENCOUNTER — Ambulatory Visit: Payer: Managed Care, Other (non HMO) | Admitting: Family

## 2023-02-13 ENCOUNTER — Other Ambulatory Visit: Payer: Self-pay | Admitting: Family

## 2023-02-13 ENCOUNTER — Other Ambulatory Visit: Payer: Self-pay

## 2023-02-13 DIAGNOSIS — F32A Depression, unspecified: Secondary | ICD-10-CM

## 2023-02-13 DIAGNOSIS — I10 Essential (primary) hypertension: Secondary | ICD-10-CM

## 2023-02-13 MED ORDER — FLUOXETINE HCL 40 MG PO CAPS: 40.0000 mg | ORAL_CAPSULE | Freq: Every day | ORAL | 1 refills | Status: DC

## 2023-02-13 MED ORDER — AMLODIPINE BESYLATE 5 MG PO TABS: 5.0000 mg | ORAL_TABLET | Freq: Every day | ORAL | 0 refills | Status: DC

## 2023-02-16 ENCOUNTER — Ambulatory Visit (INDEPENDENT_AMBULATORY_CARE_PROVIDER_SITE_OTHER): Payer: Managed Care, Other (non HMO) | Admitting: Family

## 2023-02-16 VITALS — BP 125/85 | HR 76 | Temp 98.3°F | Ht 62.0 in | Wt 256.8 lb

## 2023-02-16 DIAGNOSIS — Z7689 Persons encountering health services in other specified circumstances: Secondary | ICD-10-CM

## 2023-02-16 DIAGNOSIS — Z6841 Body Mass Index (BMI) 40.0 and over, adult: Secondary | ICD-10-CM

## 2023-02-16 DIAGNOSIS — F419 Anxiety disorder, unspecified: Secondary | ICD-10-CM

## 2023-02-16 DIAGNOSIS — F32A Depression, unspecified: Secondary | ICD-10-CM | POA: Diagnosis not present

## 2023-02-16 MED ORDER — SEMAGLUTIDE-WEIGHT MANAGEMENT 0.25 MG/0.5ML ~~LOC~~ SOAJ: 0.2500 mg | SUBCUTANEOUS | 0 refills | Status: DC

## 2023-02-16 MED ORDER — FLUOXETINE HCL 40 MG PO CAPS: 40.0000 mg | ORAL_CAPSULE | Freq: Every day | ORAL | 0 refills | Status: DC

## 2023-02-16 NOTE — Progress Notes (Signed)
Patient ID: Janice Terrell, female    DOB: 1975-06-07  MRN: [id number]  CC: Weight Check   Subjective: Janice Terrell is a 48 y.o. female who presents for weight check.   Her concerns today include:  - Doing well on Fluoxetine and Hydroxyzine, no issues/concerns. She denies thoughts of self-harm, suicidal ideations, homicidal ideations. - Doing well on Phentermine, no issues/concerns. States her employer covers weight loss injections if [facility]. She would like to try [facility].   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 06/15/2021   Degenerative tear of medial meniscus of right knee 06/15/2021   Pain in joint of right elbow 04/15/2021   Triceps tendinitis 04/15/2021   Bacterial vaginitis 09/28/2020   Candida vaginitis 09/28/2020   Prediabetes 09/27/2020   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/24/2020   Screening for malignant neoplasm of colon 09/24/2020   Abnormal uterine bleeding 08/06/2020   Abnormal vaginal bleeding 08/06/2020   Pain in pelvis 08/06/2020   Closed fracture of coronoid process of left ulna 07/20/2020   Closed fracture of head of left radius 07/20/2020   Primary osteoarthritis of left knee 02/21/2019   Cyst of left ovary 08/28/2018   Irregular periods 11/17/2014   Obesity due to excess calories 04/27/2011     Current Outpatient Medications on File Prior to Visit  Medication Sig Dispense Refill   amLODipine (NORVASC) 5 MG tablet Take 1 tablet by mouth once daily 90 tablet 0   hydrOXYzine (VISTARIL) 50 MG capsule Take 1 capsule (50 mg total) by mouth every 8 (eight) hours as needed. 30 capsule 1   phentermine 37.5 MG capsule Take 1 capsule (37.5 mg total) by mouth every morning. 30 capsule 0   cyclobenzaprine (FLEXERIL) 10 MG tablet Take 1 tablet (10 mg total) by mouth 2 (two) times daily as needed for muscle spasms. (Patient not taking: Reported on 02/16/2023) 14 tablet 0    No current facility-administered medications on file prior to visit.    No Known Allergies  Social History   Socioeconomic History   Marital status: Married    Spouse name: Not on file   Number of children: Not on file   Years of education: Not on file   Highest education level: Not on file  Occupational History   Not on file  Tobacco Use   Smoking status: Never    Passive exposure: Never   Smokeless tobacco: Never  Vaping Use   Vaping status: Never Used  Substance and Sexual Activity   Alcohol use: Not Currently    Comment: occasionally   Drug use: Never   Sexual activity: Not Currently    Birth control/protection: Surgical  Other Topics Concern   Not on file  Social History Narrative   Not on file   Social Drivers of Health   Financial Resource Strain: Low Risk  (02/16/2023)   Overall Financial Resource Strain (CARDIA)    Difficulty of Paying Living Expenses: Not hard at all  Food Insecurity: Not on file  Transportation Needs: Not on file  Physical Activity: Sufficiently Active (02/16/2023)   Exercise Vital Sign    Days of Exercise per Week: 6 days    Minutes of Exercise per Session: 30 min  Stress: Stress Concern Present (02/16/2023)   [employer] of Occupational Health - Occupational Stress Questionnaire    Feeling of Stress : Very much  Social Connections: Moderately Integrated (02/16/2023)   Social  Connection and Isolation Panel [NHANES]    Frequency of Communication with Friends and Family: More than three times a week    Frequency of Social Gatherings with Friends and Family: More than three times a week    Attends Religious Services: More than 4 times per year    Active Member of Clubs or Organizations: No    Attends Banker Meetings: Never    Marital Status: Married  Catering manager Violence: Not At Risk (02/16/2023)   Humiliation, Afraid, Rape, and Kick questionnaire    Fear of Current or Ex-Partner: No    Emotionally Abused: No     Physically Abused: No    Sexually Abused: No    Family History  Problem Relation Age of Onset   Diabetes Sister    Diabetes Maternal Grandmother    Diabetes Other    Breast cancer Neg Hx     Past Surgical History:  Procedure Laterality Date   ABDOMINAL HYSTERECTOMY     BREATH TEK H PYLORI  05/19/2011   Procedure: BREATH TEK H PYLORI;  Surgeon: Valarie Merino, MD;  Location: [facility];  Service: General;  Laterality: N/A;   CESAREAN SECTION  1998   COLONOSCOPY WITH PROPOFOL N/A 02/06/2020   Procedure: COLONOSCOPY WITH PROPOFOL;  Surgeon: Jeani Hawking, MD;  Location: [facility];  Service: Endoscopy;  Laterality: N/A;   CYSTOSCOPY  11/17/2014   Procedure: CYSTOSCOPY;  Surgeon: Jaymes Graff, MD;  Location: [facility];  Service: Gynecology;;   LAPAROSCOPIC LYSIS OF ADHESIONS N/A 08/28/2018   Procedure: Laparoscopic Lysis Of Adhesions with repair of serosal tear of colon;  Surgeon: Jaymes Graff, MD;  Location: [facility];  Service: Gynecology;  Laterality: N/A;   POLYPECTOMY  02/06/2020   Procedure: POLYPECTOMY;  Surgeon: Jeani Hawking, MD;  Location: [facility];  Service: Endoscopy;;   TUBAL LIGATION      ROS: Review of Systems Negative except as stated above  PHYSICAL EXAM: BP 125/85   Pulse 76   Temp 98.3 F (36.8 C) (Oral)   Ht 5\' 2"  (1.575 m)   Wt 256 lb 12.8 oz (116.5 kg)   LMP 08/09/2014   SpO2 97%   BMI 46.97 kg/m   Physical Exam HENT:     Head: Normocephalic and atraumatic.     Nose: Nose normal.     Mouth/Throat:     Mouth: Mucous membranes are moist.     Pharynx: Oropharynx is clear.  Eyes:     Extraocular Movements: Extraocular movements intact.     Conjunctiva/sclera: Conjunctivae normal.     Pupils: Pupils are equal, round, and reactive to light.  Cardiovascular:     Rate and Rhythm: Normal rate and regular rhythm.     Pulses: Normal pulses.     Heart sounds: Normal heart sounds.  Pulmonary:     Effort: Pulmonary effort is normal.     Breath  sounds: Normal breath sounds.  Musculoskeletal:        General: Normal range of motion.     Cervical back: Normal range of motion and neck supple.  Neurological:     General: No focal deficit present.     Mental Status: She is alert and oriented to person, place, and time.  Psychiatric:        Mood and Affect: Mood normal.        Behavior: Behavior normal.      ASSESSMENT AND PLAN: 1. Anxiety and depression (Primary) - Patient denies thoughts of self-harm, suicidal ideations,  homicidal ideations. - Continue Fluoxetine as prescribed. Counseled on medication adherence/adverse effects.  - Continue Hydroxyzine as prescribed. Counseled on medication adherence/adverse effects. No refills needed as of present. - Keep all scheduled appointments with therapist.  - Follow-up with primary provider in 3 months or sooner if needed.  - FLUoxetine (PROZAC) 40 MG capsule; Take 1 capsule (40 mg total) by mouth daily.  Dispense: 30 capsule; Refill: 0  2. Encounter for weight management 3. BMI 45.0-49.9, adult (HCC) - Phentermine discontinued per patient preference. - Semaglutide-Weight Management as prescribed. Counseled on medication adherence/adverse effects.  - Follow-up with primary provider in 4 weeks or sooner if needed.  - Semaglutide-Weight Management 0.25 MG/0.5ML SOAJ; Inject 0.25 mg into the skin once a week.  Dispense: 2 mL; Refill: 0   Patient was given the opportunity to ask questions.  Patient verbalized understanding of the plan and was able to repeat key elements of the plan. Patient was given clear instructions to go to Emergency Department or return to medical center if symptoms don't improve, worsen, or new problems develop.The patient verbalized understanding.   Requested Prescriptions   Signed Prescriptions Disp Refills   FLUoxetine (PROZAC) 40 MG capsule 30 capsule 0    Sig: Take 1 capsule (40 mg total) by mouth daily.   Semaglutide-Weight Management 0.25 MG/0.5ML SOAJ 2  mL 0    Sig: Inject 0.25 mg into the skin once a week.    Return in about 4 weeks (around 03/16/2023) for Follow-Up or next available weight check .  Rema Fendt, NP

## 2023-02-16 NOTE — Progress Notes (Signed)
Patient state no other concerns to discuss.  Patient states weight loss pen is covered under her insurance but has [facility]

## 2023-02-21 ENCOUNTER — Telehealth: Payer: Self-pay

## 2023-02-21 ENCOUNTER — Other Ambulatory Visit: Payer: Self-pay

## 2023-02-21 NOTE — Telephone Encounter (Signed)
Good Morning Janice Terrell,  Can you give me a hand witht his prio auth pleasse.  GMWNUU 0.25MG /0.5ML Auto-injectors

## 2023-02-27 ENCOUNTER — Telehealth: Payer: Self-pay | Admitting: Family

## 2023-02-27 DIAGNOSIS — Z6841 Body Mass Index (BMI) 40.0 and over, adult: Secondary | ICD-10-CM

## 2023-02-27 DIAGNOSIS — Z7689 Persons encountering health services in other specified circumstances: Secondary | ICD-10-CM

## 2023-02-27 NOTE — Telephone Encounter (Signed)
  Chief Complaint: Rx Refill Pertinent Negatives: Patient denies symptoms Disposition: [] ED /[] Urgent Care (no appt availability in office) / [] Appointment(In office/virtual)/ []  [facility]/ [] Home Care/ [] Refused Recommended Disposition /[]  Mobile Bus/ [x]  Follow-up with PCP Additional Notes: Patient is requesting a refill of phentermine, has an appointment on the 21st of February. Wishes to be seen earlier if possible. Requests for an earlier appointment if a cancellation occurs.

## 2023-02-27 NOTE — Telephone Encounter (Signed)
Spoke to patient informed this medication needs monthly weight check appointments. Got her on schedule for 03/13/2023 @3 :40 for weight check

## 2023-02-27 NOTE — Telephone Encounter (Signed)
Copied from CRM [id number]. Topic: Clinical - Medication Refill >> Feb 27, 2023  8:47 AM Phill Myron wrote: Most Recent Primary Care Visit:  Provider: Rema Fendt  Department: [facility]  Visit Type: OFFICE VISIT  Date: 02/16/2023  Medication: phentermine 37.5 MG capsule  Has the patient contacted their pharmacy? Yes (Agent: If no, request that the patient contact the pharmacy for the refill. If patient does not wish to contact the pharmacy document the reason why and proceed with request.) (Agent: If yes, when and what did the pharmacy advise?)  Is this the correct pharmacy for this prescription? Yes If no, delete pharmacy and type the correct one.  This is the patient's preferred pharmacy:   [facility] [address] ([address] [place] - [address]  [address] [address] [place] ([place] [address]  Phone: [phone] Fax: [phone]  Hours: Not open 24 hours       Has the prescription been filled recently? Yes  Is the patient out of the medication? Yes  Has the patient been seen for an appointment in the last year OR does the patient have an upcoming appointment? Yes  Can we respond through MyChart? Yes  Agent: Please be advised that Rx refills may take up to 3 business days. We ask that you follow-up with your pharmacy.

## 2023-03-05 ENCOUNTER — Telehealth: Payer: Self-pay | Admitting: Family

## 2023-03-08 ENCOUNTER — Ambulatory Visit
Admission: EM | Admit: 2023-03-08 | Discharge: 2023-03-08 | Disposition: A | Discharge status: Home / Self Care | Payer: Managed Care, Other (non HMO)

## 2023-03-08 ENCOUNTER — Encounter: Payer: Self-pay | Admitting: Emergency Medicine

## 2023-03-08 DIAGNOSIS — J069 Acute upper respiratory infection, unspecified: Secondary | ICD-10-CM | POA: Diagnosis not present

## 2023-03-08 LAB — POCT INFLUENZA A/B
Influenza A, POC: NEGATIVE
Influenza B, POC: NEGATIVE

## 2023-03-08 NOTE — ED Notes (Signed)
 Call to patient to return for COVID-19 testing as pt left prior to collection. Pt stated would return as she was still in parking area, but as of 1636 pt has still not returned for Covid testing.

## 2023-03-08 NOTE — ED Provider Notes (Signed)
 [facility]    CSN: [id number] Arrival date & time: 03/08/23  0804      History   Chief Complaint Chief Complaint  Patient presents with   Sore Throat   Cough    HPI Janice Terrell is a 48 y.o. female.   Patient here today for evaluation of cough, sore throat, congestion that started 3 days ago. She has taken tamiflu  and mucinex  at home without relief. She has not had fever. She has not had any vomiting.   The history is provided by the patient.    Past Medical History:  Diagnosis Date   Arthritis    Hypertension    Morbid obesity (HCC) 04/27/2011   Obesity, Class III, BMI 40-49.9 (morbid obesity) (HCC) 04/27/2011    Patient Active Problem List   Diagnosis Date Noted   Pain of left hand 02/24/2022   Greater trochanteric pain syndrome of left lower extremity 06/15/2021   Degenerative tear of medial meniscus of right knee 06/15/2021   Pain in joint of right elbow 04/15/2021   Triceps tendinitis 04/15/2021   Bacterial vaginitis 09/28/2020   Candida vaginitis 09/28/2020   Prediabetes 09/27/2020   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/24/2020   Screening for malignant neoplasm of colon 09/24/2020   Abnormal uterine bleeding 08/06/2020   Abnormal vaginal bleeding 08/06/2020   Pain in pelvis 08/06/2020   Closed fracture of coronoid process of left ulna 07/20/2020   Closed fracture of head of left radius 07/20/2020   Primary osteoarthritis of left knee 02/21/2019   Cyst of left ovary 08/28/2018   Irregular periods 11/17/2014   Obesity due to excess calories 04/27/2011    Past Surgical History:  Procedure Laterality Date   ABDOMINAL HYSTERECTOMY     BREATH TEK H PYLORI  05/19/2011   Procedure: BREATH TEK H PYLORI;  Surgeon: Donnice KATHEE Lunger, MD;  Location: [facility];  Service: General;  Laterality: N/A;   CESAREAN SECTION  1998   COLONOSCOPY WITH PROPOFOL  N/A 02/06/2020   Procedure: COLONOSCOPY WITH PROPOFOL ;  Surgeon: Rollin Dover, MD;  Location:  [facility];  Service: Endoscopy;  Laterality: N/A;   CYSTOSCOPY  11/17/2014   Procedure: CYSTOSCOPY;  Surgeon: Ovid All, MD;  Location: [facility];  Service: Gynecology;;   LAPAROSCOPIC LYSIS OF ADHESIONS N/A 08/28/2018   Procedure: Laparoscopic Lysis Of Adhesions with repair of serosal tear of colon;  Surgeon: All Ovid, MD;  Location: [facility];  Service: Gynecology;  Laterality: N/A;   POLYPECTOMY  02/06/2020   Procedure: POLYPECTOMY;  Surgeon: Rollin Dover, MD;  Location: [facility];  Service: Endoscopy;;   TUBAL LIGATION      OB History   No obstetric history on file.      Home Medications    Prior to Admission medications   Medication Sig Start Date End Date Taking? Authorizing Provider  amLODipine  (NORVASC ) 5 MG tablet Take 1 tablet by mouth once daily 02/13/23  Yes Lorren, Amy J, NP  FLUoxetine  (PROZAC ) 40 MG capsule Take 1 capsule (40 mg total) by mouth daily. 02/16/23  Yes Lorren, Amy J, NP  hydrOXYzine  (VISTARIL ) 50 MG capsule Take 1 capsule (50 mg total) by mouth every 8 (eight) hours as needed. 01/22/23  Yes Lorren, Amy J, NP  phentermine  37.5 MG capsule Take 1 capsule (37.5 mg total) by mouth every morning. 01/22/23  Yes Lorren, Amy J, NP  cyclobenzaprine  (FLEXERIL ) 10 MG tablet Take 1 tablet (10 mg total) by mouth 2 (two) times daily as needed for muscle  spasms. Patient not taking: Reported on 02/16/2023 06/23/22   Desiderio Chew, PA-C  FLUoxetine  (PROZAC ) 20 MG capsule Take 20 mg by mouth daily. Patient not taking: Reported on 03/08/2023 12/19/22   [provider]  phentermine  15 MG capsule Take 1 capsule by mouth every morning. Patient not taking: Reported on 03/08/2023    [provider]  Semaglutide -Weight Management 0.25 MG/0.5ML SOAJ Inject 0.25 mg into the skin once a week. Patient not taking: Reported on 03/08/2023 02/16/23   Lorren Greig PARAS, NP    Family History Family History  Problem Relation Age of Onset   Diabetes Sister    Diabetes  Maternal Grandmother    Diabetes Other    Breast cancer Neg Hx     Social History Social History   Tobacco Use   Smoking status: Never    Passive exposure: Never   Smokeless tobacco: Never  Vaping Use   Vaping status: Never Used  Substance Use Topics   Alcohol use: Not Currently    Comment: occasionally   Drug use: Never     Allergies   Patient has no known allergies.   Review of Systems Review of Systems  Constitutional:  Negative for chills and fever.  HENT:  Positive for congestion and sore throat. Negative for ear pain.   Eyes:  Negative for discharge and redness.  Respiratory:  Positive for cough. Negative for shortness of breath and wheezing.   Gastrointestinal:  Negative for abdominal pain, diarrhea, nausea and vomiting.     Physical Exam Triage Vital Signs ED Triage Vitals  Encounter Vitals Group     BP      Systolic BP Percentile      Diastolic BP Percentile      Pulse      Resp      Temp      Temp src      SpO2      Weight      Height      Head Circumference      Peak Flow      Pain Score      Pain Loc      Pain Education      Exclude from Growth Chart    No data found.  Updated Vital Signs BP (!) 151/95 (BP Location: Right Arm)   Pulse 74   Temp 98.4 F (36.9 C) (Oral)   Resp 16   LMP 08/09/2014   SpO2 96%   Visual Acuity Right Eye Distance:   Left Eye Distance:   Bilateral Distance:    Right Eye Near:   Left Eye Near:    Bilateral Near:     Physical Exam Vitals and nursing note reviewed.  Constitutional:      General: She is not in acute distress.    Appearance: Normal appearance. She is not ill-appearing.  HENT:     Head: Normocephalic and atraumatic.     Nose: Congestion present.     Mouth/Throat:     Mouth: Mucous membranes are moist.     Pharynx: No oropharyngeal exudate or posterior oropharyngeal erythema.  Eyes:     Conjunctiva/sclera: Conjunctivae normal.  Cardiovascular:     Rate and Rhythm: Normal rate and  regular rhythm.     Heart sounds: Normal heart sounds. No murmur heard. Pulmonary:     Effort: Pulmonary effort is normal. No respiratory distress.     Breath sounds: Normal breath sounds. No wheezing, rhonchi or rales.  Skin:  General: Skin is warm and dry.  Neurological:     Mental Status: She is alert.  Psychiatric:        Mood and Affect: Mood normal.        Thought Content: Thought content normal.      UC Treatments / Results  Labs (all labs ordered are listed, but only abnormal results are displayed) Labs Reviewed  POCT INFLUENZA A/B - Normal  SARS CORONAVIRUS 2 (TAT 6-24 HRS)    EKG   Radiology No results found.  Procedures Procedures (including critical care time)  Medications Ordered in UC Medications - No data to display  Initial Impression / Assessment and Plan / UC Course  I have reviewed the triage vital signs and the nursing notes.  Pertinent labs & imaging results that were available during my care of the patient were reviewed by me and considered in my medical decision making (see chart for details).    Rapid flu screening negative. Will screen for covid. Advised symptomatic treatment, increased fluids and rest with follow up if no gradual improvement or with any further concerns.   Final Clinical Impressions(s) / UC Diagnoses   Final diagnoses:  Acute upper respiratory infection   Discharge Instructions   None    ED Prescriptions   None    PDMP not reviewed this encounter.   Billy Asberry FALCON, PA-C 03/08/23 [phone]

## 2023-03-08 NOTE — ED Triage Notes (Signed)
 Pt reports productive cough, sore throat, and congestion x3 days. Tried tamiflu  and mucinex  at home with no relief. Denies fevers at home.

## 2023-03-13 ENCOUNTER — Ambulatory Visit (INDEPENDENT_AMBULATORY_CARE_PROVIDER_SITE_OTHER): Payer: Managed Care, Other (non HMO) | Admitting: Family

## 2023-03-13 ENCOUNTER — Other Ambulatory Visit: Payer: Self-pay | Admitting: Family

## 2023-03-13 VITALS — BP 133/88 | HR 91 | Temp 98.4°F | Ht 62.0 in | Wt 258.6 lb

## 2023-03-13 DIAGNOSIS — Z7689 Persons encountering health services in other specified circumstances: Secondary | ICD-10-CM | POA: Diagnosis not present

## 2023-03-13 DIAGNOSIS — E66813 Obesity, class 3: Secondary | ICD-10-CM | POA: Diagnosis not present

## 2023-03-13 DIAGNOSIS — Z6841 Body Mass Index (BMI) 40.0 and over, adult: Secondary | ICD-10-CM

## 2023-03-13 DIAGNOSIS — E669 Obesity, unspecified: Secondary | ICD-10-CM

## 2023-03-13 MED ORDER — PHENTERMINE HCL 37.5 MG PO CAPS: 37.5000 mg | ORAL_CAPSULE | ORAL | 0 refills | Status: DC

## 2023-03-13 NOTE — Progress Notes (Signed)
Patient stated she wanted to try the pen but it was a thousand dollars and it's too expensive.   No other concerns to discuss.

## 2023-03-13 NOTE — Progress Notes (Signed)
Patient ID: Janice Terrell, female    DOB: 1975/11/11  MRN: [id number]  CC: Weight Check  Subjective: Janice Terrell is a 48 y.o. female who presents for weight check.   Her concerns today include:  Reports since previous office visit did not begin Semaglutide-Weight management due to cost. She would like to resume Phentermine.   Patient Active Problem List   Diagnosis Date Noted   Pain of left hand 02/24/2022   Greater trochanteric pain syndrome of left lower extremity 06/15/2021   Degenerative tear of medial meniscus of right knee 06/15/2021   Pain in joint of right elbow 04/15/2021   Triceps tendinitis 04/15/2021   Bacterial vaginitis 09/28/2020   Candida vaginitis 09/28/2020   Prediabetes 09/27/2020   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/24/2020   Screening for malignant neoplasm of colon 09/24/2020   Abnormal uterine bleeding 08/06/2020   Abnormal vaginal bleeding 08/06/2020   Pain in pelvis 08/06/2020   Closed fracture of coronoid process of left ulna 07/20/2020   Closed fracture of head of left radius 07/20/2020   Primary osteoarthritis of left knee 02/21/2019   Cyst of left ovary 08/28/2018   Irregular periods 11/17/2014   Obesity due to excess calories 04/27/2011     Current Outpatient Medications on File Prior to Visit  Medication Sig Dispense Refill   amLODipine (NORVASC) 5 MG tablet Take 1 tablet by mouth once daily 90 tablet 0   FLUoxetine (PROZAC) 40 MG capsule Take 1 capsule (40 mg total) by mouth daily. 30 capsule 0   hydrOXYzine (VISTARIL) 50 MG capsule Take 1 capsule (50 mg total) by mouth every 8 (eight) hours as needed. 30 capsule 1   cyclobenzaprine (FLEXERIL) 10 MG tablet Take 1 tablet (10 mg total) by mouth 2 (two) times daily as needed for muscle spasms. (Patient not taking: Reported on 03/13/2023) 14 tablet 0   FLUoxetine (PROZAC) 20 MG capsule Take 20 mg by mouth daily. (Patient not taking: Reported on 03/13/2023)     phentermine 15 MG capsule Take  1 capsule by mouth every morning. (Patient not taking: Reported on 03/13/2023)     Semaglutide-Weight Management 0.25 MG/0.5ML SOAJ Inject 0.25 mg into the skin once a week. (Patient not taking: Reported on 03/13/2023) 2 mL 0   No current facility-administered medications on file prior to visit.    No Known Allergies  Social History   Socioeconomic History   Marital status: Married    Spouse name: Not on file   Number of children: Not on file   Years of education: Not on file   Highest education level: Not on file  Occupational History   Not on file  Tobacco Use   Smoking status: Never    Passive exposure: Never   Smokeless tobacco: Never  Vaping Use   Vaping status: Never Used  Substance and Sexual Activity   Alcohol use: Not Currently    Comment: occasionally   Drug use: Never   Sexual activity: Not Currently    Birth control/protection: Surgical  Other Topics Concern   Not on file  Social History Narrative   Not on file   Social Drivers of Health   Financial Resource Strain: Low Risk  (02/16/2023)   Overall Financial Resource Strain (CARDIA)    Difficulty of Paying Living Expenses: Not hard at all  Food Insecurity: Not on file  Transportation Needs: Not on file  Physical Activity: Sufficiently Active (02/16/2023)   Exercise Vital Sign    Days of Exercise  per Week: 6 days    Minutes of Exercise per Session: 30 min  Stress: Stress Concern Present (02/16/2023)   [employer] of Occupational Health - Occupational Stress Questionnaire    Feeling of Stress : Very much  Social Connections: Moderately Integrated (02/16/2023)   Social Connection and Isolation Panel [NHANES]    Frequency of Communication with Friends and Family: More than three times a week    Frequency of Social Gatherings with Friends and Family: More than three times a week    Attends Religious Services: More than 4 times per year    Active Member of [employer] or Organizations: No    Attends Tax inspector Meetings: Never    Marital Status: Married  Catering manager Violence: Not At Risk (02/16/2023)   Humiliation, Afraid, Rape, and Kick questionnaire    Fear of Current or Ex-Partner: No    Emotionally Abused: No    Physically Abused: No    Sexually Abused: No    Family History  Problem Relation Age of Onset   Diabetes Sister    Diabetes Maternal Grandmother    Diabetes Other    Breast cancer Neg Hx     Past Surgical History:  Procedure Laterality Date   ABDOMINAL HYSTERECTOMY     BREATH TEK H PYLORI  05/19/2011   Procedure: BREATH TEK H PYLORI;  Surgeon: Valarie Merino, MD;  Location: [facility];  Service: General;  Laterality: N/A;   CESAREAN SECTION  1998   COLONOSCOPY WITH PROPOFOL N/A 02/06/2020   Procedure: COLONOSCOPY WITH PROPOFOL;  Surgeon: Jeani Hawking, MD;  Location: [facility];  Service: Endoscopy;  Laterality: N/A;   CYSTOSCOPY  11/17/2014   Procedure: CYSTOSCOPY;  Surgeon: Jaymes Graff, MD;  Location: [facility];  Service: Gynecology;;   LAPAROSCOPIC LYSIS OF ADHESIONS N/A 08/28/2018   Procedure: Laparoscopic Lysis Of Adhesions with repair of serosal tear of colon;  Surgeon: Jaymes Graff, MD;  Location: [facility];  Service: Gynecology;  Laterality: N/A;   POLYPECTOMY  02/06/2020   Procedure: POLYPECTOMY;  Surgeon: Jeani Hawking, MD;  Location: [facility];  Service: Endoscopy;;   TUBAL LIGATION      ROS: Review of Systems Negative except as stated above  PHYSICAL EXAM: BP 133/88   Pulse 91   Temp 98.4 F (36.9 C) (Oral)   Ht 5\' 2"  (1.575 m)   Wt 258 lb 9.6 oz (117.3 kg)   LMP 08/09/2014   SpO2 99%   BMI 47.30 kg/m   Physical Exam HENT:     Head: Normocephalic and atraumatic.     Nose: Nose normal.     Mouth/Throat:     Mouth: Mucous membranes are moist.     Pharynx: Oropharynx is clear.  Eyes:     Extraocular Movements: Extraocular movements intact.     Conjunctiva/sclera: Conjunctivae normal.     Pupils: Pupils are equal, round, and  reactive to light.  Cardiovascular:     Rate and Rhythm: Normal rate and regular rhythm.     Pulses: Normal pulses.     Heart sounds: Normal heart sounds.  Pulmonary:     Effort: Pulmonary effort is normal.     Breath sounds: Normal breath sounds.  Musculoskeletal:        General: Normal range of motion.     Cervical back: Normal range of motion and neck supple.  Neurological:     General: No focal deficit present.     Mental Status: She is alert and oriented to  person, place, and time.  Psychiatric:        Mood and Affect: Mood normal.        Behavior: Behavior normal.     ASSESSMENT AND PLAN: 1. Encounter for weight management (Primary) 2. BMI 45.0-49.9, adult (HCC) - Resume Phentermine as prescribed. Counseled on medication adherence/adverse effects.  - Follow-up with primary provider in 4 weeks or sooner if needed.  - phentermine 37.5 MG capsule; Take 1 capsule (37.5 mg total) by mouth every morning.  Dispense: 30 capsule; Refill: 0   Patient was given the opportunity to ask questions.  Patient verbalized understanding of the plan and was able to repeat key elements of the plan. Patient was given clear instructions to go to Emergency Department or return to medical center if symptoms don't improve, worsen, or new problems develop.The patient verbalized understanding.   Requested Prescriptions   Signed Prescriptions Disp Refills   phentermine 37.5 MG capsule 30 capsule 0    Sig: Take 1 capsule (37.5 mg total) by mouth every morning.    Return in about 4 weeks (around 04/10/2023) for Follow-Up or next available weight check .  Rema Fendt, NP

## 2023-03-14 NOTE — Telephone Encounter (Signed)
Phentermine prescribed 03/13/2023.

## 2023-03-23 ENCOUNTER — Ambulatory Visit: Payer: Managed Care, Other (non HMO) | Admitting: Family

## 2023-04-12 ENCOUNTER — Ambulatory Visit (INDEPENDENT_AMBULATORY_CARE_PROVIDER_SITE_OTHER): Payer: Managed Care, Other (non HMO) | Admitting: Family

## 2023-04-12 VITALS — BP 139/89 | HR 72 | Temp 97.7°F | Ht 62.0 in | Wt 265.6 lb

## 2023-04-12 DIAGNOSIS — F32A Depression, unspecified: Secondary | ICD-10-CM | POA: Diagnosis not present

## 2023-04-12 DIAGNOSIS — F419 Anxiety disorder, unspecified: Secondary | ICD-10-CM | POA: Diagnosis not present

## 2023-04-12 DIAGNOSIS — Z6841 Body Mass Index (BMI) 40.0 and over, adult: Secondary | ICD-10-CM

## 2023-04-12 DIAGNOSIS — E669 Obesity, unspecified: Secondary | ICD-10-CM | POA: Diagnosis not present

## 2023-04-12 DIAGNOSIS — Z7689 Persons encountering health services in other specified circumstances: Secondary | ICD-10-CM

## 2023-04-12 MED ORDER — FLUOXETINE HCL 40 MG PO CAPS: 40.0000 mg | ORAL_CAPSULE | Freq: Every day | ORAL | 0 refills | Status: DC

## 2023-04-12 MED ORDER — HYDROXYZINE PAMOATE 50 MG PO CAPS: 50.0000 mg | ORAL_CAPSULE | Freq: Three times a day (TID) | ORAL | 1 refills | Status: DC | PRN

## 2023-04-12 MED ORDER — PHENTERMINE HCL 37.5 MG PO CAPS: 37.5000 mg | ORAL_CAPSULE | ORAL | 0 refills | Status: DC

## 2023-04-12 NOTE — Progress Notes (Signed)
 Patient ID: Janice Terrell, female    DOB: January 22, 1976  MRN: [id number]  CC: Weight Check  Subjective: Janice Terrell is a 48 y.o. female who presents for weight check.   Her concerns today include:  - Doing well on Phentermine, no issues/concerns.  - Doing well on Fluoxetine and Hydroxyzine, no issues/concerns. She denies thoughts of self-harm, suicidal ideations, homicidal ideations.  Patient Active Problem List   Diagnosis Date Noted   Pain of left hand 02/24/2022   Greater trochanteric pain syndrome of left lower extremity 06/15/2021   Degenerative tear of medial meniscus of right knee 06/15/2021   Pain in joint of right elbow 04/15/2021   Triceps tendinitis 04/15/2021   Bacterial vaginitis 09/28/2020   Candida vaginitis 09/28/2020   Prediabetes 09/27/2020   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/24/2020   Screening for malignant neoplasm of colon 09/24/2020   Abnormal uterine bleeding 08/06/2020   Abnormal vaginal bleeding 08/06/2020   Pain in pelvis 08/06/2020   Closed fracture of coronoid process of left ulna 07/20/2020   Closed fracture of head of left radius 07/20/2020   Primary osteoarthritis of left knee 02/21/2019   Cyst of left ovary 08/28/2018   Irregular periods 11/17/2014   Obesity due to excess calories 04/27/2011     Current Outpatient Medications on File Prior to Visit  Medication Sig Dispense Refill   amLODipine (NORVASC) 5 MG tablet Take 1 tablet by mouth once daily 90 tablet 0   cyclobenzaprine (FLEXERIL) 10 MG tablet Take 1 tablet (10 mg total) by mouth 2 (two) times daily as needed for muscle spasms. (Patient not taking: Reported on 02/16/2023) 14 tablet 0   FLUoxetine (PROZAC) 20 MG capsule Take 20 mg by mouth daily. (Patient not taking: Reported on 03/08/2023)     phentermine 15 MG capsule Take 1 capsule by mouth every morning. (Patient not taking: Reported on 03/08/2023)     Semaglutide-Weight Management 0.25 MG/0.5ML SOAJ Inject 0.25 mg into the skin  once a week. (Patient not taking: Reported on 03/08/2023) 2 mL 0   No current facility-administered medications on file prior to visit.    No Known Allergies  Social History   Socioeconomic History   Marital status: Married    Spouse name: Not on file   Number of children: Not on file   Years of education: Not on file   Highest education level: Not on file  Occupational History   Not on file  Tobacco Use   Smoking status: Never    Passive exposure: Never   Smokeless tobacco: Never  Vaping Use   Vaping status: Never Used  Substance and Sexual Activity   Alcohol use: Not Currently    Comment: occasionally   Drug use: Never   Sexual activity: Not Currently    Birth control/protection: Surgical  Other Topics Concern   Not on file  Social History Narrative   Not on file   Social Drivers of Health   Financial Resource Strain: Low Risk  (02/16/2023)   Overall Financial Resource Strain (CARDIA)    Difficulty of Paying Living Expenses: Not hard at all  Food Insecurity: Not on file  Transportation Needs: Not on file  Physical Activity: Sufficiently Active (02/16/2023)   Exercise Vital Sign    Days of Exercise per Week: 6 days    Minutes of Exercise per Session: 30 min  Stress: Stress Concern Present (02/16/2023)   [employer] of Occupational Health - Occupational Stress Questionnaire    Feeling of Stress :  Very much  Social Connections: Moderately Integrated (02/16/2023)   Social Connection and Isolation Panel [NHANES]    Frequency of Communication with Friends and Family: More than three times a week    Frequency of Social Gatherings with Friends and Family: More than three times a week    Attends Religious Services: More than 4 times per year    Active Member of [employer] or Organizations: No    Attends Banker Meetings: Never    Marital Status: Married  Catering manager Violence: Not At Risk (02/16/2023)   Humiliation, Afraid, Rape, and Kick questionnaire     Fear of Current or Ex-Partner: No    Emotionally Abused: No    Physically Abused: No    Sexually Abused: No    Family History  Problem Relation Age of Onset   Diabetes Sister    Diabetes Maternal Grandmother    Diabetes Other    Breast cancer Neg Hx     Past Surgical History:  Procedure Laterality Date   ABDOMINAL HYSTERECTOMY     BREATH TEK H PYLORI  05/19/2011   Procedure: BREATH TEK H PYLORI;  Surgeon: Valarie Merino, MD;  Location: [facility];  Service: General;  Laterality: N/A;   CESAREAN SECTION  1998   COLONOSCOPY WITH PROPOFOL N/A 02/06/2020   Procedure: COLONOSCOPY WITH PROPOFOL;  Surgeon: Jeani Hawking, MD;  Location: [facility];  Service: Endoscopy;  Laterality: N/A;   CYSTOSCOPY  11/17/2014   Procedure: CYSTOSCOPY;  Surgeon: Jaymes Graff, MD;  Location: [facility];  Service: Gynecology;;   LAPAROSCOPIC LYSIS OF ADHESIONS N/A 08/28/2018   Procedure: Laparoscopic Lysis Of Adhesions with repair of serosal tear of colon;  Surgeon: Jaymes Graff, MD;  Location: [facility];  Service: Gynecology;  Laterality: N/A;   POLYPECTOMY  02/06/2020   Procedure: POLYPECTOMY;  Surgeon: Jeani Hawking, MD;  Location: [facility];  Service: Endoscopy;;   TUBAL LIGATION      ROS: Review of Systems Negative except as stated above  PHYSICAL EXAM: BP 139/89   Pulse 72   Temp 97.7 F (36.5 C) (Oral)   Ht 5\' 2"  (1.575 m)   Wt 265 lb 9.6 oz (120.5 kg)   LMP 08/09/2014   SpO2 97%   BMI 48.58 kg/m   Wt Readings from Last 3 Encounters:  04/12/23 265 lb 9.6 oz (120.5 kg)  03/13/23 258 lb 9.6 oz (117.3 kg)  02/16/23 256 lb 12.8 oz (116.5 kg)   Physical Exam HENT:     Head: Normocephalic and atraumatic.     Nose: Nose normal.     Mouth/Throat:     Mouth: Mucous membranes are moist.     Pharynx: Oropharynx is clear.  Eyes:     Extraocular Movements: Extraocular movements intact.     Conjunctiva/sclera: Conjunctivae normal.     Pupils: Pupils are equal, round, and reactive to light.   Cardiovascular:     Rate and Rhythm: Normal rate and regular rhythm.     Pulses: Normal pulses.     Heart sounds: Normal heart sounds.  Pulmonary:     Effort: Pulmonary effort is normal.     Breath sounds: Normal breath sounds.  Musculoskeletal:        General: Normal range of motion.     Cervical back: Normal range of motion and neck supple.  Neurological:     General: No focal deficit present.     Mental Status: She is alert and oriented to person, place, and time.  Psychiatric:  Mood and Affect: Mood normal.        Behavior: Behavior normal.     ASSESSMENT AND PLAN: 1. Encounter for weight management (Primary) 2. BMI 45.0-49.9, adult (HCC) - Continue Phentermine as prescribed. Counseled on medication adherence/adverse effects. - Follow-up with primary provider in 4 weeks or sooner if needed.  - phentermine 37.5 MG capsule; Take 1 capsule (37.5 mg total) by mouth every morning.  Dispense: 30 capsule; Refill: 0  3. Anxiety and depression - Patient denies thoughts of self-harm, suicidal ideations, homicidal ideations. - Continue Fluoxetine and Hydroxyzine as prescribed. Counseled on medication adherence/adverse effects.  - Follow-up with primary provider in 3 months or sooner if needed. - hydrOXYzine (VISTARIL) 50 MG capsule; Take 1 capsule (50 mg total) by mouth every 8 (eight) hours as needed.  Dispense: 30 capsule; Refill: 1 - FLUoxetine (PROZAC) 40 MG capsule; Take 1 capsule (40 mg total) by mouth daily.  Dispense: 90 capsule; Refill: 0    Patient was given the opportunity to ask questions.  Patient verbalized understanding of the plan and was able to repeat key elements of the plan. Patient was given clear instructions to go to Emergency Department or return to medical center if symptoms don't improve, worsen, or new problems develop.The patient verbalized understanding.    Requested Prescriptions   Signed Prescriptions Disp Refills   hydrOXYzine (VISTARIL) 50  MG capsule 30 capsule 1    Sig: Take 1 capsule (50 mg total) by mouth every 8 (eight) hours as needed.   phentermine 37.5 MG capsule 30 capsule 0    Sig: Take 1 capsule (37.5 mg total) by mouth every morning.   FLUoxetine (PROZAC) 40 MG capsule 90 capsule 0    Sig: Take 1 capsule (40 mg total) by mouth daily.    Return in about 4 weeks (around 05/10/2023) for Follow-Up or next available weight check.  Rema Fendt, NP

## 2023-04-12 NOTE — Progress Notes (Signed)
 Patient states no concerns to discuss.

## 2023-05-14 ENCOUNTER — Ambulatory Visit (INDEPENDENT_AMBULATORY_CARE_PROVIDER_SITE_OTHER): Admitting: Family

## 2023-05-14 ENCOUNTER — Encounter: Payer: Self-pay | Admitting: Family

## 2023-05-14 VITALS — BP 126/81 | HR 79 | Temp 99.1°F | Resp 18 | Ht 62.0 in | Wt 272.6 lb

## 2023-05-14 DIAGNOSIS — G5601 Carpal tunnel syndrome, right upper limb: Secondary | ICD-10-CM

## 2023-05-14 DIAGNOSIS — J029 Acute pharyngitis, unspecified: Secondary | ICD-10-CM | POA: Diagnosis not present

## 2023-05-14 DIAGNOSIS — I10 Essential (primary) hypertension: Secondary | ICD-10-CM | POA: Diagnosis not present

## 2023-05-14 DIAGNOSIS — Z7689 Persons encountering health services in other specified circumstances: Secondary | ICD-10-CM

## 2023-05-14 DIAGNOSIS — R059 Cough, unspecified: Secondary | ICD-10-CM | POA: Diagnosis not present

## 2023-05-14 DIAGNOSIS — R7303 Prediabetes: Secondary | ICD-10-CM

## 2023-05-14 DIAGNOSIS — Z6841 Body Mass Index (BMI) 40.0 and over, adult: Secondary | ICD-10-CM

## 2023-05-14 LAB — POCT RAPID STREP A (OFFICE): Rapid Strep A Screen: NEGATIVE

## 2023-05-14 MED ORDER — PSEUDOEPH-BROMPHEN-DM 30-2-10 MG/5ML PO SYRP: 5.0000 mL | ORAL_SOLUTION | Freq: Four times a day (QID) | ORAL | 0 refills | Status: DC | PRN

## 2023-05-14 MED ORDER — GABAPENTIN 300 MG PO CAPS: 300.0000 mg | ORAL_CAPSULE | Freq: Every day | ORAL | 1 refills | Status: DC

## 2023-05-14 MED ORDER — BENZONATATE 100 MG PO CAPS: 100.0000 mg | ORAL_CAPSULE | Freq: Three times a day (TID) | ORAL | 1 refills | Status: DC | PRN

## 2023-05-14 MED ORDER — AMLODIPINE BESYLATE 5 MG PO TABS: 5.0000 mg | ORAL_TABLET | Freq: Every day | ORAL | 0 refills | Status: DC

## 2023-05-14 MED ORDER — LEVOCETIRIZINE DIHYDROCHLORIDE 2.5 MG/5ML PO SOLN: 5.0000 mg | Freq: Every evening | ORAL | 0 refills | Status: DC

## 2023-05-14 MED ORDER — PHENTERMINE HCL 37.5 MG PO CAPS: 37.5000 mg | ORAL_CAPSULE | ORAL | 0 refills | Status: DC

## 2023-05-14 NOTE — Progress Notes (Signed)
 Patient ID: Janice Terrell, female    DOB: [birth year]  MRN: [id number]  CC: Weight Check  Subjective: Janice Terrell is a 48 y.o. female who presents for weight check.  Her concerns today include:  - Doing well on Amlodipine, no issues/concerns. She does not complain of red flag symptoms such as but not limited to chest pain, shortness of breath, worst headache of life, nausea/vomiting.   - Doing well on Phentermine, no issues/concerns. States due to work she has been unable to exercise as she normally would.  - Cough and sore throat for 1 week. Denies red flag symptoms. Taking over-the-counter medications with minimal relief.  - Intermittent right hand numbness. Denies recent trauma/injury and red flag symptoms. Using a wrist brace as needed.   Patient Active Problem List   Diagnosis Date Noted   Pain of left hand 02/24/2022   Greater trochanteric pain syndrome of left lower extremity 06/15/2021   Degenerative tear of medial meniscus of right knee 06/15/2021   Pain in joint of right elbow 04/15/2021   Triceps tendinitis 04/15/2021   Bacterial vaginitis 09/28/2020   Candida vaginitis 09/28/2020   Prediabetes 09/27/2020   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/24/2020   Screening for malignant neoplasm of colon 09/24/2020   Abnormal uterine bleeding 08/06/2020   Abnormal vaginal bleeding 08/06/2020   Pain in pelvis 08/06/2020   Closed fracture of coronoid process of left ulna 07/20/2020   Closed fracture of head of left radius 07/20/2020   Primary osteoarthritis of left knee 02/21/2019   Cyst of left ovary 08/28/2018   Irregular periods 11/17/2014   Obesity due to excess calories 04/27/2011     Current Outpatient Medications on File Prior to Visit  Medication Sig Dispense Refill   FLUoxetine (PROZAC) 40 MG capsule Take 1 capsule (40 mg total) by mouth daily. 90 capsule 0   hydrOXYzine (VISTARIL) 50 MG capsule Take 1 capsule (50 mg total) by mouth every 8 (eight) hours as  needed. 30 capsule 1   cyclobenzaprine (FLEXERIL) 10 MG tablet Take 1 tablet (10 mg total) by mouth 2 (two) times daily as needed for muscle spasms. (Patient not taking: Reported on 02/16/2023) 14 tablet 0   FLUoxetine (PROZAC) 20 MG capsule Take 20 mg by mouth daily. (Patient not taking: Reported on 03/08/2023)     phentermine 15 MG capsule Take 1 capsule by mouth every morning. (Patient not taking: Reported on 03/08/2023)     Semaglutide-Weight Management 0.25 MG/0.5ML SOAJ Inject 0.25 mg into the skin once a week. (Patient not taking: Reported on 03/08/2023) 2 mL 0   No current facility-administered medications on file prior to visit.    No Known Allergies  Social History   Socioeconomic History   Marital status: Married    Spouse name: Not on file   Number of children: Not on file   Years of education: Not on file   Highest education level: Not on file  Occupational History   Not on file  Tobacco Use   Smoking status: Never    Passive exposure: Never   Smokeless tobacco: Never  Vaping Use   Vaping status: Never Used  Substance and Sexual Activity   Alcohol use: Not Currently    Comment: occasionally   Drug use: Never   Sexual activity: Not Currently    Birth control/protection: Surgical  Other Topics Concern   Not on file  Social History Narrative   Not on file   Social Drivers of Health  Financial Resource Strain: Low Risk  (02/16/2023)   Overall Financial Resource Strain (CARDIA)    Difficulty of Paying Living Expenses: Not hard at all  Food Insecurity: No Food Insecurity (05/14/2023)   Hunger Vital Sign    Worried About Running Out of Food in the Last Year: Never true    Ran Out of Food in the Last Year: Never true  Transportation Needs: No Transportation Needs (05/14/2023)   PRAPARE - Administrator, Civil Service (Medical): No    Lack of Transportation (Non-Medical): No  Physical Activity: Sufficiently Active (02/16/2023)   Exercise Vital Sign    Days of  Exercise per Week: 6 days    Minutes of Exercise per Session: 30 min  Stress: Stress Concern Present (02/16/2023)   [employer] of Occupational Health - Occupational Stress Questionnaire    Feeling of Stress : Very much  Social Connections: Moderately Integrated (02/16/2023)   Social Connection and Isolation Panel [NHANES]    Frequency of Communication with Friends and Family: More than three times a week    Frequency of Social Gatherings with Friends and Family: More than three times a week    Attends Religious Services: More than 4 times per year    Active Member of [employer] or Organizations: No    Attends Banker Meetings: Never    Marital Status: Married  Catering manager Violence: Not At Risk (05/14/2023)   Humiliation, Afraid, Rape, and Kick questionnaire    Fear of Current or Ex-Partner: No    Emotionally Abused: No    Physically Abused: No    Sexually Abused: No    Family History  Problem Relation Age of Onset   Diabetes Sister    Diabetes Maternal Grandmother    Diabetes Other    Breast cancer Neg Hx     Past Surgical History:  Procedure Laterality Date   ABDOMINAL HYSTERECTOMY     BREATH TEK H PYLORI  05/19/2011   Procedure: BREATH TEK H PYLORI;  Surgeon: Azucena Bollard, MD;  Location: [facility];  Service: General;  Laterality: N/A;   CESAREAN SECTION  1998   COLONOSCOPY WITH PROPOFOL N/A 02/06/2020   Procedure: COLONOSCOPY WITH PROPOFOL;  Surgeon: Alvis Jourdain, MD;  Location: [facility];  Service: Endoscopy;  Laterality: N/A;   CYSTOSCOPY  11/17/2014   Procedure: CYSTOSCOPY;  Surgeon: Marylu Soda, MD;  Location: [facility];  Service: Gynecology;;   LAPAROSCOPIC LYSIS OF ADHESIONS N/A 08/28/2018   Procedure: Laparoscopic Lysis Of Adhesions with repair of serosal tear of colon;  Surgeon: Marylu Soda, MD;  Location: [facility];  Service: Gynecology;  Laterality: N/A;   POLYPECTOMY  02/06/2020   Procedure: POLYPECTOMY;  Surgeon: Alvis Jourdain, MD;   Location: [facility];  Service: Endoscopy;;   TUBAL LIGATION      ROS: Review of Systems Negative except as stated above  PHYSICAL EXAM: BP 126/81   Pulse 79   Temp 99.1 F (37.3 C) (Oral)   Resp 18   Ht 5\' 2"  (1.575 m)   Wt 272 lb 9.6 oz (123.7 kg)   LMP 08/09/2014   SpO2 96%   BMI 49.86 kg/m   Wt Readings from Last 3 Encounters:  05/14/23 272 lb 9.6 oz (123.7 kg)  04/12/23 265 lb 9.6 oz (120.5 kg)  03/13/23 258 lb 9.6 oz (117.3 kg)   Physical Exam HENT:     Head: Normocephalic and atraumatic.     Right Ear: Tympanic membrane, ear canal and external ear  normal.     Left Ear: Tympanic membrane, ear canal and external ear normal.     Nose: Nose normal.     Mouth/Throat:     Mouth: Mucous membranes are moist.     Pharynx: Oropharynx is clear.  Eyes:     Extraocular Movements: Extraocular movements intact.     Conjunctiva/sclera: Conjunctivae normal.     Pupils: Pupils are equal, round, and reactive to light.  Cardiovascular:     Rate and Rhythm: Normal rate and regular rhythm.     Pulses: Normal pulses.     Heart sounds: Normal heart sounds.  Pulmonary:     Effort: Pulmonary effort is normal.     Breath sounds: Normal breath sounds.  Musculoskeletal:        General: Normal range of motion.     Right shoulder: Normal.     Left shoulder: Normal.     Right upper arm: Normal.     Left upper arm: Normal.     Right elbow: Normal.     Left elbow: Normal.     Right forearm: Normal.     Left forearm: Normal.     Right wrist: Normal.     Left wrist: Normal.     Right hand: Normal.     Left hand: Normal.     Cervical back: Normal, normal range of motion and neck supple.     Thoracic back: Normal.     Lumbar back: Normal.     Right hip: Normal.     Left hip: Normal.     Right upper leg: Normal.     Left upper leg: Normal.     Right knee: Normal.     Left knee: Normal.     Right lower leg: Normal.     Left lower leg: Normal.     Right ankle: Normal.     Left  ankle: Normal.     Right foot: Normal.     Left foot: Normal.  Neurological:     General: No focal deficit present.     Mental Status: She is alert and oriented to person, place, and time.  Psychiatric:        Mood and Affect: Mood normal.        Behavior: Behavior normal.     ASSESSMENT AND PLAN: 1. Primary hypertension (Primary) - Continue Amlodipine as prescribed.  - Routine screening.  - Counseled on blood pressure goal of less than 130/80, low-sodium, DASH diet, medication compliance, and 150 minutes of moderate intensity exercise per week as tolerated. Counseled on medication adherence and adverse effects. - Follow-up with primary provider in 3 months or sooner if needed. - Basic Metabolic Panel - amLODipine (NORVASC) 5 MG tablet; Take 1 tablet (5 mg total) by mouth daily.  Dispense: 90 tablet; Refill: 0  2. Prediabetes - Routine screening.  - Hemoglobin A1c  3. Carpal tunnel syndrome of right wrist - Gabapentin as prescribed. Counseled on medication adherence/adverse effects. - Follow-up with primary provider in 4 weeks or sooner if needed. - gabapentin (NEURONTIN) 300 MG capsule; Take 1 capsule (300 mg total) by mouth at bedtime.  Dispense: 30 capsule; Refill: 1  4. Encounter for weight management 5. BMI 45.0-49.9, adult (HCC) - Continue Phentermine as prescribed. Counseled on medication adherence/adverse effects. - I did check the [place]  prescription drug database.  - Follow-up with primary provider in 4 weeks or sooner if needed. - phentermine 37.5 MG capsule; Take 1 capsule (37.5 mg total)  by mouth every morning.  Dispense: 30 capsule; Refill: 0  6. Cough, unspecified type 7. Sore throat - Patient today in office with no cardiopulmonary/acute distress.  - Brompheniramine-Pseudoephedrine-DM, Benzonatate, and Levocetirizine as prescribed. Counseled on medication adherence/adverse effects. - Routine screening.  - Follow-up with primary provider as  scheduled. - POCT rapid strep A; Future - Culture, Group A Strep - COVID-19, Flu A+B and RSV - benzonatate (TESSALON PERLES) 100 MG capsule; Take 1 capsule (100 mg total) by mouth 3 (three) times daily as needed.  Dispense: 30 capsule; Refill: 1 - brompheniramine-pseudoephedrine-DM 30-2-10 MG/5ML syrup; Take 5 mLs by mouth 4 (four) times daily as needed.  Dispense: 120 mL; Refill: 0 - levocetirizine (XYZAL) 2.5 MG/5ML solution; Take 10 mLs (5 mg total) by mouth every evening.  Dispense: 148 mL; Refill: 0   Patient was given the opportunity to ask questions.  Patient verbalized understanding of the plan and was able to repeat key elements of the plan. Patient was given clear instructions to go to Emergency Department or return to medical center if symptoms don't improve, worsen, or new problems develop.The patient verbalized understanding.   Orders Placed This Encounter  Procedures   Culture, Group A Strep   COVID-19, Flu A+B and RSV   Basic Metabolic Panel   Hemoglobin A1c   POCT rapid strep A     Requested Prescriptions   Signed Prescriptions Disp Refills   amLODipine (NORVASC) 5 MG tablet 90 tablet 0    Sig: Take 1 tablet (5 mg total) by mouth daily.   gabapentin (NEURONTIN) 300 MG capsule 30 capsule 1    Sig: Take 1 capsule (300 mg total) by mouth at bedtime.   benzonatate (TESSALON PERLES) 100 MG capsule 30 capsule 1    Sig: Take 1 capsule (100 mg total) by mouth 3 (three) times daily as needed.   brompheniramine-pseudoephedrine-DM 30-2-10 MG/5ML syrup 120 mL 0    Sig: Take 5 mLs by mouth 4 (four) times daily as needed.   levocetirizine (XYZAL) 2.5 MG/5ML solution 148 mL 0    Sig: Take 10 mLs (5 mg total) by mouth every evening.   phentermine 37.5 MG capsule 30 capsule 0    Sig: Take 1 capsule (37.5 mg total) by mouth every morning.    Return in about 4 weeks (around 06/11/2023) for Follow-Up or next available weight check.  Senaida Dama, NP

## 2023-05-14 NOTE — Progress Notes (Signed)
 Throat has been sore for a week and a cough she can't get rid of.  Weight management, fingers have been tingling and burning

## 2023-05-15 LAB — BASIC METABOLIC PANEL WITH GFR
BUN/Creatinine Ratio: 24 — ABNORMAL HIGH (ref 9–23)
BUN: 17 mg/dL (ref 6–24)
CO2: 19 mmol/L — ABNORMAL LOW (ref 20–29)
Calcium: 9.2 mg/dL (ref 8.7–10.2)
Chloride: 103 mmol/L (ref 96–106)
Creatinine, Ser: 0.7 mg/dL (ref 0.57–1.00)
Glucose: 89 mg/dL (ref 70–99)
Potassium: 4.4 mmol/L (ref 3.5–5.2)
Sodium: 138 mmol/L (ref 134–144)
eGFR: 106 mL/min/{1.73_m2} (ref >59)

## 2023-05-15 LAB — HEMOGLOBIN A1C
Est. average glucose Bld gHb Est-mCnc: 126 mg/dL
Hgb A1c MFr Bld: 6 % — ABNORMAL HIGH (ref 4.8–5.6)

## 2023-05-16 LAB — COVID-19, FLU A+B AND RSV
Influenza A, NAA: NOT DETECTED
Influenza B, NAA: NOT DETECTED
RSV, NAA: NOT DETECTED
SARS-CoV-2, NAA: NOT DETECTED

## 2023-05-16 LAB — CULTURE, GROUP A STREP: Strep A Culture: NEGATIVE

## 2023-06-08 ENCOUNTER — Ambulatory Visit (INDEPENDENT_AMBULATORY_CARE_PROVIDER_SITE_OTHER): Admitting: Family

## 2023-06-08 ENCOUNTER — Other Ambulatory Visit: Payer: Self-pay | Admitting: Emergency Medicine

## 2023-06-08 ENCOUNTER — Telehealth: Payer: Self-pay | Admitting: Family

## 2023-06-08 ENCOUNTER — Encounter: Payer: Self-pay | Admitting: Family

## 2023-06-08 VITALS — BP 137/87 | HR 76 | Temp 98.8°F | Resp 18 | Ht 62.0 in | Wt 271.8 lb

## 2023-06-08 DIAGNOSIS — Z7689 Persons encountering health services in other specified circumstances: Secondary | ICD-10-CM

## 2023-06-08 DIAGNOSIS — Z6841 Body Mass Index (BMI) 40.0 and over, adult: Secondary | ICD-10-CM

## 2023-06-08 DIAGNOSIS — E669 Obesity, unspecified: Secondary | ICD-10-CM

## 2023-06-08 MED ORDER — PHENTERMINE HCL 37.5 MG PO CAPS
37.5000 mg | ORAL_CAPSULE | ORAL | 0 refills | Status: AC
Start: 2023-06-08 — End: ?

## 2023-06-08 NOTE — Progress Notes (Signed)
 Patient ID: Janice Terrell, female    DOB: 1975-07-01  MRN: [id number]  CC: Weight Check  Subjective: Janice Terrell is a 48 y.o. female who presents for weight check.   Her concerns today include:  Doing well on Phentermine , no issues/concerns. She is watching what she eats.   Patient Active Problem List   Diagnosis Date Noted   Pain of left hand 02/24/2022   Greater trochanteric pain syndrome of left lower extremity 06/15/2021   Degenerative tear of medial meniscus of right knee 06/15/2021   Pain in joint of right elbow 04/15/2021   Triceps tendinitis 04/15/2021   Bacterial vaginitis 09/28/2020   Candida vaginitis 09/28/2020   Prediabetes 09/27/2020   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/24/2020   Screening for malignant neoplasm of colon 09/24/2020   Abnormal uterine bleeding 08/06/2020   Abnormal vaginal bleeding 08/06/2020   Pain in pelvis 08/06/2020   Closed fracture of coronoid process of left ulna 07/20/2020   Closed fracture of head of left radius 07/20/2020   Primary osteoarthritis of left knee 02/21/2019   Cyst of left ovary 08/28/2018   Irregular periods 11/17/2014   Obesity due to excess calories 04/27/2011     Current Outpatient Medications on File Prior to Visit  Medication Sig Dispense Refill   amLODipine  (NORVASC ) 5 MG tablet Take 1 tablet (5 mg total) by mouth daily. 90 tablet 0   FLUoxetine  (PROZAC ) 40 MG capsule Take 1 capsule (40 mg total) by mouth daily. 90 capsule 0   gabapentin  (NEURONTIN ) 300 MG capsule Take 1 capsule (300 mg total) by mouth at bedtime. 30 capsule 1   hydrOXYzine  (VISTARIL ) 50 MG capsule Take 1 capsule (50 mg total) by mouth every 8 (eight) hours as needed. 30 capsule 1   benzonatate  (TESSALON  PERLES) 100 MG capsule Take 1 capsule (100 mg total) by mouth 3 (three) times daily as needed. (Patient not taking: Reported on 06/08/2023) 30 capsule 1   brompheniramine-pseudoephedrine-DM 30-2-10 MG/5ML syrup Take 5 mLs by mouth 4 (four)  times daily as needed. (Patient not taking: Reported on 06/08/2023) 120 mL 0   cyclobenzaprine  (FLEXERIL ) 10 MG tablet Take 1 tablet (10 mg total) by mouth 2 (two) times daily as needed for muscle spasms. (Patient not taking: Reported on 02/16/2023) 14 tablet 0   FLUoxetine  (PROZAC ) 20 MG capsule Take 20 mg by mouth daily. (Patient not taking: Reported on 03/08/2023)     levocetirizine (XYZAL ) 2.5 MG/5ML solution Take 10 mLs (5 mg total) by mouth every evening. (Patient not taking: Reported on 06/08/2023) 148 mL 0   phentermine  15 MG capsule Take 1 capsule by mouth every morning. (Patient not taking: Reported on 03/08/2023)     Semaglutide -Weight Management 0.25 MG/0.5ML SOAJ Inject 0.25 mg into the skin once a week. (Patient not taking: Reported on 03/08/2023) 2 mL 0   No current facility-administered medications on file prior to visit.    No Known Allergies  Social History   Socioeconomic History   Marital status: Married    Spouse name: Not on file   Number of children: Not on file   Years of education: Not on file   Highest education level: Not on file  Occupational History   Not on file  Tobacco Use   Smoking status: Never    Passive exposure: Never   Smokeless tobacco: Never  Vaping Use   Vaping status: Never Used  Substance and Sexual Activity   Alcohol use: Not Currently    Comment: occasionally  Drug use: Never   Sexual activity: Not Currently    Birth control/protection: Surgical  Other Topics Concern   Not on file  Social History Narrative   Not on file   Social Drivers of Health   Financial Resource Strain: Low Risk  (02/16/2023)   Overall Financial Resource Strain (CARDIA)    Difficulty of Paying Living Expenses: Not hard at all  Food Insecurity: No Food Insecurity (05/14/2023)   Hunger Vital Sign    Worried About Running Out of Food in the Last Year: Never true    Ran Out of Food in the Last Year: Never true  Transportation Needs: No Transportation Needs (05/14/2023)    PRAPARE - Administrator, Civil Service (Medical): No    Lack of Transportation (Non-Medical): No  Physical Activity: Sufficiently Active (02/16/2023)   Exercise Vital Sign    Days of Exercise per Week: 6 days    Minutes of Exercise per Session: 30 min  Stress: Stress Concern Present (02/16/2023)   [employer] of Occupational Health - Occupational Stress Questionnaire    Feeling of Stress : Very much  Social Connections: Moderately Integrated (02/16/2023)   Social Connection and Isolation Panel [NHANES]    Frequency of Communication with Friends and Family: More than three times a week    Frequency of Social Gatherings with Friends and Family: More than three times a week    Attends Religious Services: More than 4 times per year    Active Member of [employer] or Organizations: No    Attends Banker Meetings: Never    Marital Status: Married  Catering manager Violence: Not At Risk (05/14/2023)   Humiliation, Afraid, Rape, and Kick questionnaire    Fear of Current or Ex-Partner: No    Emotionally Abused: No    Physically Abused: No    Sexually Abused: No    Family History  Problem Relation Age of Onset   Diabetes Sister    Diabetes Maternal Grandmother    Diabetes Other    Breast cancer Neg Hx     Past Surgical History:  Procedure Laterality Date   ABDOMINAL HYSTERECTOMY     BREATH TEK H PYLORI  05/19/2011   Procedure: BREATH TEK H PYLORI;  Surgeon: Azucena Bollard, MD;  Location: [facility];  Service: General;  Laterality: N/A;   CESAREAN SECTION  1998   COLONOSCOPY WITH PROPOFOL  N/A 02/06/2020   Procedure: COLONOSCOPY WITH PROPOFOL ;  Surgeon: Alvis Jourdain, MD;  Location: [facility];  Service: Endoscopy;  Laterality: N/A;   CYSTOSCOPY  11/17/2014   Procedure: CYSTOSCOPY;  Surgeon: Marylu Soda, MD;  Location: [facility];  Service: Gynecology;;   LAPAROSCOPIC LYSIS OF ADHESIONS N/A 08/28/2018   Procedure: Laparoscopic Lysis Of Adhesions with repair of  serosal tear of colon;  Surgeon: Marylu Soda, MD;  Location: [facility];  Service: Gynecology;  Laterality: N/A;   POLYPECTOMY  02/06/2020   Procedure: POLYPECTOMY;  Surgeon: Alvis Jourdain, MD;  Location: [facility];  Service: Endoscopy;;   TUBAL LIGATION      ROS: Review of Systems Negative except as stated above  PHYSICAL EXAM: BP 137/87   Pulse 76   Temp 98.8 F (37.1 C) (Oral)   Resp 18   Ht 5\' 2"  (1.575 m)   Wt 271 lb 12.8 oz (123.3 kg)   LMP 08/09/2014   SpO2 96%   BMI 49.71 kg/m   Wt Readings from Last 3 Encounters:  06/08/23 271 lb 12.8 oz (123.3 kg)  05/14/23 272 lb 9.6 oz (123.7 kg)  04/12/23 265 lb 9.6 oz (120.5 kg)   Physical Exam HENT:     Head: Normocephalic and atraumatic.     Nose: Nose normal.     Mouth/Throat:     Mouth: Mucous membranes are moist.     Pharynx: Oropharynx is clear.  Eyes:     Extraocular Movements: Extraocular movements intact.     Conjunctiva/sclera: Conjunctivae normal.     Pupils: Pupils are equal, round, and reactive to light.  Cardiovascular:     Rate and Rhythm: Normal rate and regular rhythm.     Pulses: Normal pulses.     Heart sounds: Normal heart sounds.  Pulmonary:     Effort: Pulmonary effort is normal.     Breath sounds: Normal breath sounds.  Musculoskeletal:        General: Normal range of motion.     Cervical back: Normal range of motion and neck supple.  Neurological:     General: No focal deficit present.     Mental Status: She is alert and oriented to person, place, and time.  Psychiatric:        Mood and Affect: Mood normal.        Behavior: Behavior normal.     ASSESSMENT AND PLAN: 1. Encounter for weight management (Primary) 2. BMI 45.0-49.9, adult (HCC) - Continue Phentermine  as prescribed. Counseled on medication adherence/adverse effects.  - Follow-up with primary provider in 4 weeks or sooner if needed. - phentermine  37.5 MG capsule; Take 1 capsule (37.5 mg total) by mouth every morning.   Dispense: 30 capsule; Refill: 0   Patient was given the opportunity to ask questions.  Patient verbalized understanding of the plan and was able to repeat key elements of the plan. Patient was given clear instructions to go to Emergency Department or return to medical center if symptoms don't improve, worsen, or new problems develop.The patient verbalized understanding.    Requested Prescriptions   Signed Prescriptions Disp Refills   phentermine  37.5 MG capsule 30 capsule 0    Sig: Take 1 capsule (37.5 mg total) by mouth every morning.    Return in about 4 weeks (around 07/06/2023) for Follow-Up or next available weight check.  Senaida Dama, NP

## 2023-06-08 NOTE — Telephone Encounter (Signed)
 The pharmacist called to inform us  that the patient has been on medication for over 12 weeks. We need to ensure that we have clear documentation justifying the continuation of the medication moving forward. Based on the patient's BMI, her weight is increasing. At her next weight in , they may not approve refill if her BMI has not approve. We will possibly need to discuss an alternative that is in her insurances formulary.

## 2023-06-08 NOTE — Progress Notes (Signed)
 No concerns.

## 2023-06-08 NOTE — Telephone Encounter (Signed)
 Noted.

## 2023-06-11 ENCOUNTER — Telehealth: Payer: Self-pay

## 2023-06-11 ENCOUNTER — Other Ambulatory Visit: Payer: Self-pay

## 2023-06-11 NOTE — Telephone Encounter (Signed)
 Pharmacy Patient Advocate Encounter  Received notification from [facility] that Prior Authorization for PHENTERMINE  has been DENIED.  Full denial letter will be uploaded to the media tab. See denial reason below.   PA #/Case ID/Reference #: [id number]  Your plan only covers this drug for a total of 3 months within a 365-day period for your health condition. We have denied your request for this drug because it is for longer treatment.

## 2023-06-11 NOTE — Telephone Encounter (Signed)
 Pharmacy Patient Advocate Encounter   Received notification from CoverMyMeds that prior authorization for PHENTERMINE  is required/requested.   Insurance verification completed.   The patient is insured through [facility] .   Per test claim: PA required; PA submitted to above mentioned insurance via CoverMyMeds Key/confirmation #/EOC [id number] Status is pending

## 2023-06-13 NOTE — Telephone Encounter (Signed)
 Thank you :)

## 2023-06-22 ENCOUNTER — Other Ambulatory Visit: Payer: Self-pay

## 2023-06-22 NOTE — Telephone Encounter (Signed)
 Copied from CRM [id number]. Topic: Clinical - Prescription Issue >> Jun 22, 2023 11:29 AM Antwanette L wrote: Reason for CRM: Patient is calling because [facility] needs a prior authorization from  [employer]. They want to know  why the patient  is still taking phentermine  37.5 MG capsule.   Pharmacy Details [facility] [address] ([address]), [place] - [address] [place] ([place]) [place] [address] Phone: [phone] Fax: [phone]

## 2023-06-23 ENCOUNTER — Other Ambulatory Visit: Payer: Self-pay | Admitting: Family

## 2023-06-26 NOTE — Telephone Encounter (Signed)
 Complete

## 2023-06-26 NOTE — Telephone Encounter (Signed)
 Noted

## 2023-06-26 NOTE — Telephone Encounter (Signed)
 Called patient and DOB was confirmed and she is aware of the denial from insurance    FYI PCP

## 2023-07-09 ENCOUNTER — Encounter: Payer: Self-pay | Admitting: Family

## 2023-07-09 ENCOUNTER — Encounter: Admitting: Family

## 2023-07-09 NOTE — Progress Notes (Signed)
 Erroneous encounter-disregard

## 2023-08-06 ENCOUNTER — Telehealth (INDEPENDENT_AMBULATORY_CARE_PROVIDER_SITE_OTHER): Admitting: Family

## 2023-08-06 DIAGNOSIS — Z7689 Persons encountering health services in other specified circumstances: Secondary | ICD-10-CM

## 2023-08-06 DIAGNOSIS — E669 Obesity, unspecified: Secondary | ICD-10-CM

## 2023-08-06 NOTE — Progress Notes (Signed)
 Virtual Visit via Video Note  I connected with Joelene Brien Blush, on 08/06/2023 at 4:01 PM by video and verified that I am speaking with the correct person using two identifiers.  Consent: I discussed the limitations, risks, security and privacy concerns of performing an evaluation and management service by video and the availability of in person appointments. I also discussed with the patient that there may be a patient responsible charge related to this service. The patient expressed understanding and agreed to proceed.   Location of Patient: Home  Location of Provider: [facility]   Persons participating in Telemedicine visit: Yvonne Brien Blush Greig Lorren, NP Purvis Pepper, CMA   History of Present Illness: Janice Terrell is a 48 y.o. female who presents for weight management. States her health insurance declined Phentermine  prescription. States she considered trying Ozempic  or Trulicity. Patient understands that due to she is not diabetic health insurance will not cover. She would like referral to a weight specialist.    Past Medical History:  Diagnosis Date   Arthritis    Hypertension    Morbid obesity (HCC) 04/27/2011   Obesity, Class III, BMI 40-49.9 (morbid obesity) 04/27/2011   No Known Allergies  Current Outpatient Medications on File Prior to Visit  Medication Sig Dispense Refill   amLODipine  (NORVASC ) 5 MG tablet Take 1 tablet (5 mg total) by mouth daily. 90 tablet 0   benzonatate  (TESSALON  PERLES) 100 MG capsule Take 1 capsule (100 mg total) by mouth 3 (three) times daily as needed. (Patient not taking: Reported on 06/08/2023) 30 capsule 1   brompheniramine-pseudoephedrine-DM 30-2-10 MG/5ML syrup Take 5 mLs by mouth 4 (four) times daily as needed. (Patient not taking: Reported on 06/08/2023) 120 mL 0   cyclobenzaprine  (FLEXERIL ) 10 MG tablet Take 1 tablet (10 mg total) by mouth 2 (two) times daily as needed for muscle spasms.  (Patient not taking: Reported on 02/16/2023) 14 tablet 0   FLUoxetine  (PROZAC ) 20 MG capsule TAKE 1 CAPSULE BY MOUTH BY MOUTH ONCE DAILY 90 capsule 0   FLUoxetine  (PROZAC ) 40 MG capsule Take 1 capsule (40 mg total) by mouth daily. 90 capsule 0   gabapentin  (NEURONTIN ) 300 MG capsule Take 1 capsule (300 mg total) by mouth at bedtime. 30 capsule 1   hydrOXYzine  (VISTARIL ) 50 MG capsule Take 1 capsule (50 mg total) by mouth every 8 (eight) hours as needed. 30 capsule 1   levocetirizine (XYZAL ) 2.5 MG/5ML solution Take 10 mLs (5 mg total) by mouth every evening. (Patient not taking: Reported on 06/08/2023) 148 mL 0   phentermine  15 MG capsule Take 1 capsule by mouth every morning. (Patient not taking: Reported on 03/08/2023)     phentermine  37.5 MG capsule Take 1 capsule (37.5 mg total) by mouth every morning. 30 capsule 0   Semaglutide -Weight Management 0.25 MG/0.5ML SOAJ Inject 0.25 mg into the skin once a week. (Patient not taking: Reported on 03/08/2023) 2 mL 0   No current facility-administered medications on file prior to visit.    Observations/Objective: Alert and oriented x 3. Not in acute distress. Physical examination not completed as this is a telemedicine visit.  Assessment and Plan: 1. Encounter for weight management (Primary) - Referral to Medical Weight Management for evaluation/management. - Amb Ref to Medical Weight Management   Follow Up Instructions: Follow-up with primary provider as scheduled.   Patient was given clear instructions to go to Emergency Department or return to medical center if symptoms don't improve, worsen, or  new problems develop.The patient verbalized understanding.  I discussed the assessment and treatment plan with the patient. The patient was provided an opportunity to ask questions and all were answered. The patient agreed with the plan and demonstrated an understanding of the instructions.   The patient was advised to call back or seek an in-person  evaluation if the symptoms worsen or if the condition fails to improve as anticipated.     I provided 7 minutes total of non-face-to-face time during this encounter.   Greig JINNY Drones, NP  [facility] [place], [place] [phone] 08/06/2023, 4:01 PM

## 2023-08-11 ENCOUNTER — Ambulatory Visit: Admission: EM | Admit: 2023-08-11 | Discharge: 2023-08-11 | Disposition: A | Discharge status: Home / Self Care

## 2023-08-11 ENCOUNTER — Encounter: Payer: Self-pay | Admitting: Emergency Medicine

## 2023-08-11 DIAGNOSIS — M7989 Other specified soft tissue disorders: Secondary | ICD-10-CM | POA: Diagnosis not present

## 2023-08-11 NOTE — ED Provider Notes (Signed)
 [facility]    CSN: [id number] Arrival date & time: 08/11/23  1011      History   Chief Complaint Chief Complaint  Patient presents with   Leg Swelling    HPI Janice Terrell is a 48 y.o. female.   Patient presents with bilateral ankle swelling that started few days ago.  She denies calf redness, swelling, pain.  Denies shortness of breath.  She reports this is never happened before.  She reports noticing increased swelling when taking her socks off at the end of the day.  Reports some tingling to the right anterior shin.  Denies recent injury or trauma.    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/24/2020   Screening for malignant neoplasm of colon 09/24/2020   Abnormal uterine bleeding 08/06/2020   Abnormal vaginal bleeding 08/06/2020   Pain in pelvis 08/06/2020   Closed fracture of coronoid process of left ulna 07/20/2020   Closed fracture of head of left radius 07/20/2020   Primary osteoarthritis of left knee 02/21/2019   Cyst of left ovary 08/28/2018   Irregular periods 11/17/2014   Obesity due to excess calories 04/27/2011    Past Surgical History:  Procedure Laterality Date   ABDOMINAL HYSTERECTOMY     BREATH TEK H PYLORI  05/19/2011   Procedure: BREATH TEK H PYLORI;  Surgeon: Donnice KATHEE Lunger, MD;  Location: [facility];  Service: General;  Laterality: N/A;   CESAREAN SECTION  1998   COLONOSCOPY WITH  PROPOFOL  N/A 02/06/2020   Procedure: COLONOSCOPY WITH PROPOFOL ;  Surgeon: Rollin Dover, MD;  Location: [facility];  Service: Endoscopy;  Laterality: N/A;   CYSTOSCOPY  11/17/2014   Procedure: CYSTOSCOPY;  Surgeon: Ovid All, MD;  Location: [facility];  Service: Gynecology;;   LAPAROSCOPIC LYSIS OF ADHESIONS N/A 08/28/2018   Procedure: Laparoscopic Lysis Of Adhesions with repair of serosal tear of colon;  Surgeon: All Ovid, MD;  Location: [facility];  Service: Gynecology;  Laterality: N/A;   POLYPECTOMY  02/06/2020   Procedure: POLYPECTOMY;  Surgeon: Rollin Dover, MD;  Location: [facility];  Service: Endoscopy;;   TUBAL LIGATION      OB History   No obstetric history on file.      Home Medications    Prior to Admission medications   Medication Sig Start Date End Date Taking? Authorizing Provider  amLODipine  (NORVASC ) 5 MG tablet Take 1 tablet (5 mg total) by mouth daily. 05/14/23   Lorren Greig PARAS, NP  benzonatate  (TESSALON  PERLES) 100 MG capsule Take 1 capsule (100 mg total) by mouth 3 (three) times daily as needed. Patient not taking: Reported on 06/08/2023 05/14/23   Lorren Greig PARAS, NP  brompheniramine-pseudoephedrine-DM 30-2-10 MG/5ML syrup Take 5 mLs by mouth 4 (four) times daily as needed. Patient not taking: Reported on 06/08/2023 05/14/23   Lorren Greig PARAS, NP  cyclobenzaprine  (FLEXERIL ) 10 MG tablet Take 1 tablet (10 mg total) by mouth  2 (two) times daily as needed for muscle spasms. Patient not taking: Reported on 02/16/2023 06/23/22   Geiple, Joshua, PA-C  FLUoxetine  (PROZAC ) 20 MG capsule TAKE 1 CAPSULE BY MOUTH BY MOUTH ONCE DAILY 06/26/23   Lorren Greig PARAS, NP  FLUoxetine  (PROZAC ) 40 MG capsule Take 1 capsule (40 mg total) by mouth daily. 04/12/23 07/11/23  Lorren Greig PARAS, NP  gabapentin  (NEURONTIN ) 300 MG capsule Take 1 capsule (300 mg total) by mouth at bedtime. 05/14/23   Lorren Greig PARAS, NP  hydrOXYzine  (VISTARIL ) 50 MG capsule Take 1 capsule (50 mg total) by mouth every 8  (eight) hours as needed. 04/12/23   Lorren Greig PARAS, NP  levocetirizine (XYZAL ) 2.5 MG/5ML solution Take 10 mLs (5 mg total) by mouth every evening. Patient not taking: Reported on 06/08/2023 05/14/23   Lorren Greig PARAS, NP  phentermine  15 MG capsule Take 1 capsule by mouth every morning. Patient not taking: Reported on 03/08/2023    [provider]  phentermine  37.5 MG capsule Take 1 capsule (37.5 mg total) by mouth every morning. 06/08/23   Lorren Greig PARAS, NP  Semaglutide -Weight Management 0.25 MG/0.5ML SOAJ Inject 0.25 mg into the skin once a week. Patient not taking: Reported on 03/08/2023 02/16/23   Lorren Greig PARAS, NP    Family History Family History  Problem Relation Age of Onset   Diabetes Sister    Diabetes Maternal Grandmother    Diabetes Other    Breast cancer Neg Hx     Social History Social History   Tobacco Use   Smoking status: Never    Passive exposure: Never   Smokeless tobacco: Never  Vaping Use   Vaping status: Never Used  Substance Use Topics   Alcohol use: Not Currently    Comment: occasionally   Drug use: Never     Allergies   Patient has no known allergies.   Review of Systems Review of Systems  Constitutional:  Negative for chills and fever.  HENT:  Negative for ear pain and sore throat.   Eyes:  Negative for pain and visual disturbance.  Respiratory:  Negative for cough and shortness of breath.   Cardiovascular:  Positive for leg swelling. Negative for chest pain and palpitations.  Gastrointestinal:  Negative for abdominal pain and vomiting.  Genitourinary:  Negative for dysuria and hematuria.  Musculoskeletal:  Negative for arthralgias and back pain.  Skin:  Negative for color change and rash.  Neurological:  Negative for seizures and syncope.  All other systems reviewed and are negative.    Physical Exam Triage Vital Signs ED Triage Vitals  Encounter Vitals Group     BP 08/11/23 1042 129/75     Girls Systolic BP Percentile --       Girls Diastolic BP Percentile --      Boys Systolic BP Percentile --      Boys Diastolic BP Percentile --      Pulse Rate 08/11/23 1042 73     Resp 08/11/23 1042 18     Temp 08/11/23 1042 98.5 F (36.9 C)     Temp Source 08/11/23 1042 Oral     SpO2 08/11/23 1042 97 %     Weight 08/11/23 1040 271 lb 13.2 oz (123.3 kg)     Height --      Head Circumference --      Peak Flow --      Pain Score 08/11/23 1039 6     Pain Loc --  Pain Education --      Exclude from Growth Chart --    No data found.  Updated Vital Signs BP 129/75 (BP Location: Left Arm)   Pulse 73   Temp 98.5 F (36.9 C) (Oral)   Resp 18   Wt 271 lb 13.2 oz (123.3 kg)   LMP 08/09/2014   SpO2 97%   BMI 49.72 kg/m   Visual Acuity Right Eye Distance:   Left Eye Distance:   Bilateral Distance:    Right Eye Near:   Left Eye Near:    Bilateral Near:     Physical Exam Vitals and nursing note reviewed.  Constitutional:      General: She is not in acute distress.    Appearance: She is well-developed.  HENT:     Head: Normocephalic and atraumatic.  Eyes:     Conjunctiva/sclera: Conjunctivae normal.  Cardiovascular:     Rate and Rhythm: Normal rate and regular rhythm.     Heart sounds: No murmur heard. Pulmonary:     Effort: Pulmonary effort is normal. No respiratory distress.     Breath sounds: Normal breath sounds.  Abdominal:     Palpations: Abdomen is soft.     Tenderness: There is no abdominal tenderness.  Musculoskeletal:        General: No swelling.     Cervical back: Neck supple.     Comments: Mild swelling to bilateral ankles.  No calf tenderness, redness, warmth.  Skin:    General: Skin is warm and dry.     Capillary Refill: Capillary refill takes less than 2 seconds.  Neurological:     Mental Status: She is alert.  Psychiatric:        Mood and Affect: Mood normal.      UC Treatments / Results  Labs (all labs ordered are listed, but only abnormal results are displayed) Labs  Reviewed - No data to display  EKG   Radiology No results found.  Procedures Procedures (including critical care time)  Medications Ordered in UC Medications - No data to display  Initial Impression / Assessment and Plan / UC Course  I have reviewed the triage vital signs and the nursing notes.  Pertinent labs & imaging results that were available during my care of the patient were reviewed by me and considered in my medical decision making (see chart for details).     Suspicion for DVT is low given no calf tenderness, swelling is bilateral to the ankles mostly.  Suspicion for CHF low, lungs clear to auscultation, patient denies shortness of breath or chest pain.  Recommend compression socks and elevation and follow-up with PCP next week. Final Clinical Impressions(s) / UC Diagnoses   Final diagnoses:  Leg swelling     Discharge Instructions      Recommend compression socks and elevation. If no improvement recommend follow-up with your primary care next week. If you develop calf pain, swelling, redness or shortness of breath please go to the emergency department immediately.   ED Prescriptions   None    PDMP not reviewed this encounter.   Ward, Harlene PEDLAR, PA-C 08/11/23 1113

## 2023-08-11 NOTE — ED Triage Notes (Signed)
 Pt presents c/o leg swelling and burning sensation underneath the skin since Tuesday. Pt reports this is the first time anything like this has happened.

## 2023-08-11 NOTE — Discharge Instructions (Signed)
 Recommend compression socks and elevation. If no improvement recommend follow-up with your primary care next week. If you develop calf pain, swelling, redness or shortness of breath please go to the emergency department immediately.

## 2023-08-29 ENCOUNTER — Encounter (INDEPENDENT_AMBULATORY_CARE_PROVIDER_SITE_OTHER): Payer: Self-pay

## 2023-08-29 ENCOUNTER — Institutional Professional Consult (permissible substitution) (INDEPENDENT_AMBULATORY_CARE_PROVIDER_SITE_OTHER): Payer: Self-pay | Admitting: Family Medicine

## 2023-10-25 ENCOUNTER — Ambulatory Visit (INDEPENDENT_AMBULATORY_CARE_PROVIDER_SITE_OTHER): Admitting: Family

## 2023-10-25 ENCOUNTER — Encounter: Payer: Self-pay | Admitting: Family

## 2023-10-25 VITALS — BP 135/88 | HR 73 | Temp 98.3°F | Resp 16 | Ht 62.0 in | Wt 259.2 lb

## 2023-10-25 DIAGNOSIS — Z1231 Encounter for screening mammogram for malignant neoplasm of breast: Secondary | ICD-10-CM

## 2023-10-25 DIAGNOSIS — Z79899 Other long term (current) drug therapy: Secondary | ICD-10-CM

## 2023-10-25 DIAGNOSIS — Z23 Encounter for immunization: Secondary | ICD-10-CM | POA: Diagnosis not present

## 2023-10-25 DIAGNOSIS — Z13228 Encounter for screening for other metabolic disorders: Secondary | ICD-10-CM

## 2023-10-25 DIAGNOSIS — Z130 Encounter for screening for diseases of the blood and blood-forming organs and certain disorders involving the immune mechanism: Secondary | ICD-10-CM

## 2023-10-25 DIAGNOSIS — Z0000 Encounter for general adult medical examination without abnormal findings: Secondary | ICD-10-CM | POA: Diagnosis not present

## 2023-10-25 DIAGNOSIS — Z13220 Encounter for screening for lipoid disorders: Secondary | ICD-10-CM

## 2023-10-25 DIAGNOSIS — Z1329 Encounter for screening for other suspected endocrine disorder: Secondary | ICD-10-CM

## 2023-10-25 DIAGNOSIS — F419 Anxiety disorder, unspecified: Secondary | ICD-10-CM

## 2023-10-25 DIAGNOSIS — R7303 Prediabetes: Secondary | ICD-10-CM

## 2023-10-25 DIAGNOSIS — F32A Depression, unspecified: Secondary | ICD-10-CM

## 2023-10-25 DIAGNOSIS — I10 Essential (primary) hypertension: Secondary | ICD-10-CM

## 2023-10-25 MED ORDER — FLUOXETINE HCL 40 MG PO CAPS: 40.0000 mg | ORAL_CAPSULE | Freq: Every day | ORAL | 0 refills | Status: AC

## 2023-10-25 MED ORDER — HYDROXYZINE PAMOATE 50 MG PO CAPS: 50.0000 mg | ORAL_CAPSULE | Freq: Three times a day (TID) | ORAL | 1 refills | Status: AC | PRN

## 2023-10-25 MED ORDER — AMLODIPINE BESYLATE 5 MG PO TABS: 5.0000 mg | ORAL_TABLET | Freq: Every day | ORAL | 0 refills | Status: DC

## 2023-10-25 NOTE — Progress Notes (Signed)
 Patient ID: Janice Terrell, female    DOB: 12/27/1975  MRN: [id number]  CC: Annual Exam  Subjective: Janice Terrell is a 48 y.o. female who presents for annual exam.   Her concerns today include:  - Due for mammogram. - Up to date on cervical cancer screening and colon cancer screening per [facility]. - Doing well on Amlodipine , no issues/concerns. She does not complain of red flag symptoms such as but not limited to chest pain, shortness of breath, worst headache of life, nausea/vomiting.  - Anxiety depression persisting related to going through separation from her husband. Fluoxetine  and Hydroxyzine  helping. Established with a therapist. She denies thoughts of self-harm, suicidal ideations, homicidal ideations.  Patient Active Problem List   Diagnosis Date Noted   Pain of left hand 02/24/2022   Greater trochanteric pain syndrome of left lower extremity 06/15/2021   Degenerative tear of medial meniscus of right knee 06/15/2021   Pain in joint of right elbow 04/15/2021   Triceps tendinitis 04/15/2021   Bacterial vaginitis 09/28/2020   Candida vaginitis 09/28/2020   Prediabetes 09/27/2020   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/24/2020   Screening for malignant neoplasm of colon 09/24/2020   Abnormal uterine bleeding 08/06/2020   Abnormal vaginal bleeding 08/06/2020   Pain in pelvis 08/06/2020   Closed fracture of coronoid process of left ulna 07/20/2020   Closed fracture of head of left radius 07/20/2020   Primary osteoarthritis of left knee 02/21/2019   Cyst of left ovary 08/28/2018   Irregular periods 11/17/2014   Obesity due to excess calories 04/27/2011     Current Outpatient Medications on File Prior to Visit  Medication Sig Dispense Refill   benzonatate  (TESSALON  PERLES) 100 MG capsule Take 1 capsule (100 mg total) by mouth 3 (three) times daily as needed. (Patient not taking: Reported on 06/08/2023) 30 capsule 1   brompheniramine-pseudoephedrine-DM 30-2-10 MG/5ML syrup  Take 5 mLs by mouth 4 (four) times daily as needed. (Patient not taking: Reported on 06/08/2023) 120 mL 0   cyclobenzaprine  (FLEXERIL ) 10 MG tablet Take 1 tablet (10 mg total) by mouth 2 (two) times daily as needed for muscle spasms. (Patient not taking: Reported on 02/16/2023) 14 tablet 0   FLUoxetine  (PROZAC ) 20 MG capsule TAKE 1 CAPSULE BY MOUTH BY MOUTH ONCE DAILY (Patient not taking: Reported on 10/25/2023) 90 capsule 0   gabapentin  (NEURONTIN ) 300 MG capsule Take 1 capsule (300 mg total) by mouth at bedtime. (Patient not taking: Reported on 10/25/2023) 30 capsule 1   levocetirizine (XYZAL ) 2.5 MG/5ML solution Take 10 mLs (5 mg total) by mouth every evening. (Patient not taking: Reported on 06/08/2023) 148 mL 0   phentermine  15 MG capsule Take 1 capsule by mouth every morning. (Patient not taking: Reported on 03/08/2023)     phentermine  37.5 MG capsule Take 1 capsule (37.5 mg total) by mouth every morning. (Patient not taking: Reported on 10/25/2023) 30 capsule 0   Semaglutide -Weight Management 0.25 MG/0.5ML SOAJ Inject 0.25 mg into the skin once a week. (Patient not taking: Reported on 03/08/2023) 2 mL 0   No current facility-administered medications on file prior to visit.    No Known Allergies  Social History   Socioeconomic History   Marital status: Married    Spouse name: Not on file   Number of children: Not on file   Years of education: Not on file   Highest education level: Not on file  Occupational History   Not on file  Tobacco Use  Smoking status: Never    Passive exposure: Never   Smokeless tobacco: Never  Vaping Use   Vaping status: Never Used  Substance and Sexual Activity   Alcohol use: Not Currently    Comment: occasionally   Drug use: Never   Sexual activity: Not Currently    Birth control/protection: Surgical  Other Topics Concern   Not on file  Social History Narrative   Not on file   Social Drivers of Health   Financial Resource Strain: Low Risk  (02/16/2023)    Overall Financial Resource Strain (CARDIA)    Difficulty of Paying Living Expenses: Not hard at all  Food Insecurity: No Food Insecurity (05/14/2023)   Hunger Vital Sign    Worried About Running Out of Food in the Last Year: Never true    Ran Out of Food in the Last Year: Never true  Transportation Needs: No Transportation Needs (05/14/2023)   PRAPARE - Administrator, Civil Service (Medical): No    Lack of Transportation (Non-Medical): No  Physical Activity: Sufficiently Active (02/16/2023)   Exercise Vital Sign    Days of Exercise per Week: 6 days    Minutes of Exercise per Session: 30 min  Stress: Stress Concern Present (10/25/2023)   [employer] of Occupational Health - Occupational Stress Questionnaire    Feeling of Stress: Very much  Social Connections: Moderately Integrated (02/16/2023)   Social Connection and Isolation Panel    Frequency of Communication with Friends and Family: More than three times a week    Frequency of Social Gatherings with Friends and Family: More than three times a week    Attends Religious Services: More than 4 times per year    Active Member of [employer] or Organizations: No    Attends Banker Meetings: Never    Marital Status: Married  Catering manager Violence: Not At Risk (05/14/2023)   Humiliation, Afraid, Rape, and Kick questionnaire    Fear of Current or Ex-Partner: No    Emotionally Abused: No    Physically Abused: No    Sexually Abused: No    Family History  Problem Relation Age of Onset   Diabetes Sister    Diabetes Maternal Grandmother    Diabetes Other    Breast cancer Neg Hx     Past Surgical History:  Procedure Laterality Date   ABDOMINAL HYSTERECTOMY     BREATH TEK H PYLORI  05/19/2011   Procedure: BREATH TEK H PYLORI;  Surgeon: Donnice KATHEE Lunger, MD;  Location: [facility];  Service: General;  Laterality: N/A;   CESAREAN SECTION  1998   COLONOSCOPY WITH PROPOFOL  N/A 02/06/2020   Procedure: COLONOSCOPY  WITH PROPOFOL ;  Surgeon: Rollin Dover, MD;  Location: [facility];  Service: Endoscopy;  Laterality: N/A;   CYSTOSCOPY  11/17/2014   Procedure: CYSTOSCOPY;  Surgeon: Ovid All, MD;  Location: [facility];  Service: Gynecology;;   LAPAROSCOPIC LYSIS OF ADHESIONS N/A 08/28/2018   Procedure: Laparoscopic Lysis Of Adhesions with repair of serosal tear of colon;  Surgeon: All Ovid, MD;  Location: [facility];  Service: Gynecology;  Laterality: N/A;   POLYPECTOMY  02/06/2020   Procedure: POLYPECTOMY;  Surgeon: Rollin Dover, MD;  Location: [facility];  Service: Endoscopy;;   TUBAL LIGATION      ROS: Review of Systems Negative except as stated above  PHYSICAL EXAM: BP 135/88   Pulse 73   Temp 98.3 F (36.8 C) (Oral)   Resp 16   Ht 5' 2 (1.575 m)  Wt 259 lb 3.2 oz (117.6 kg)   LMP 08/09/2014   SpO2 96%   BMI 47.41 kg/m   Physical Exam HENT:     Head: Normocephalic and atraumatic.     Right Ear: Tympanic membrane, ear canal and external ear normal.     Left Ear: Tympanic membrane, ear canal and external ear normal.     Nose: Nose normal.     Mouth/Throat:     Mouth: Mucous membranes are moist.     Pharynx: Oropharynx is clear.  Eyes:     Extraocular Movements: Extraocular movements intact.     Conjunctiva/sclera: Conjunctivae normal.     Pupils: Pupils are equal, round, and reactive to light.  Neck:     Thyroid: No thyroid mass, thyromegaly or thyroid tenderness.  Cardiovascular:     Rate and Rhythm: Normal rate and regular rhythm.     Pulses: Normal pulses.     Heart sounds: Normal heart sounds.  Pulmonary:     Effort: Pulmonary effort is normal.     Breath sounds: Normal breath sounds.  Chest:     Comments: Patient declined. Abdominal:     General: Bowel sounds are normal.     Palpations: Abdomen is soft.  Genitourinary:    Comments: Patient declined. Musculoskeletal:        General: Normal range of motion.     Right shoulder: Normal.     Left shoulder: Normal.      Right upper arm: Normal.     Left upper arm: Normal.     Right elbow: Normal.     Left elbow: Normal.     Right forearm: Normal.     Left forearm: Normal.     Right wrist: Normal.     Left wrist: Normal.     Right hand: Normal.     Left hand: Normal.     Cervical back: Normal, normal range of motion and neck supple.     Thoracic back: Normal.     Lumbar back: Normal.     Right hip: Normal.     Left hip: Normal.     Right upper leg: Normal.     Left upper leg: Normal.     Right knee: Normal.     Left knee: Normal.     Right lower leg: Normal.     Left lower leg: Normal.     Right ankle: Normal.     Left ankle: Normal.     Right foot: Normal.     Left foot: Normal.  Skin:    General: Skin is warm and dry.     Capillary Refill: Capillary refill takes less than 2 seconds.  Neurological:     General: No focal deficit present.     Mental Status: She is alert and oriented to person, place, and time.  Psychiatric:        Mood and Affect: Mood normal.        Behavior: Behavior normal.    ASSESSMENT AND PLAN: 1. Annual physical exam (Primary) - Counseled on 150 minutes of exercise per week as tolerated, healthy eating (including decreased daily intake of saturated fats, cholesterol, added sugars, sodium), STI prevention, and routine healthcare maintenance.  2. Screening for metabolic disorder - Routine screening.  - CMP14+EGFR  3. Screening for deficiency anemia - Routine screening.  - CBC  4. Prediabetes - Routine screening.  - Hemoglobin A1c  5. Screening cholesterol level - Routine screening.  - Lipid panel  6.  Thyroid disorder screen - Routine screening.  - TSH  7. Encounter for screening mammogram for malignant neoplasm of breast - Routine screening.  - MM Digital Screening; Future  8. Primary hypertension - Continue Amlodipine  as prescribed.  - Counseled on blood pressure goal of less than 130/80, low-sodium, DASH diet, medication compliance, and 150  minutes of moderate intensity exercise per week as tolerated. Counseled on medication adherence and adverse effects. - Follow-up with primary provider in 3 months or sooner if needed.  - amLODipine  (NORVASC ) 5 MG tablet; Take 1 tablet (5 mg total) by mouth daily.  Dispense: 90 tablet; Refill: 0  9. Anxiety and depression - Patient denies thoughts of self-harm, suicidal ideations, homicidal ideations. - Continue Fluoxetine  and Hydroxyzine  as prescribed. Counseled on medication adherence/adverse effects.  - Keep all scheduled appointments with established therapist.  - Follow-up with primary provider as scheduled.  - FLUoxetine  (PROZAC ) 40 MG capsule; Take 1 capsule (40 mg total) by mouth daily.  Dispense: 90 capsule; Refill: 0 - hydrOXYzine  (VISTARIL ) 50 MG capsule; Take 1 capsule (50 mg total) by mouth every 8 (eight) hours as needed.  Dispense: 90 capsule; Refill: 1  10. Immunization due - Administered.  - Flu vaccine trivalent PF, 6mos and older(Flulaval,Afluria,Fluarix,Fluzone)  Patient was given the opportunity to ask questions.  Patient verbalized understanding of the plan and was able to repeat key elements of the plan. Patient was given clear instructions to go to Emergency Department or return to medical center if symptoms don't improve, worsen, or new problems develop.The patient verbalized understanding.   Orders Placed This Encounter  Procedures   MM Digital Screening   Flu vaccine trivalent PF, 6mos and older(Flulaval,Afluria,Fluarix,Fluzone)   CBC   Lipid panel   CMP14+EGFR   Hemoglobin A1c   TSH     Requested Prescriptions   Signed Prescriptions Disp Refills   FLUoxetine  (PROZAC ) 40 MG capsule 90 capsule 0    Sig: Take 1 capsule (40 mg total) by mouth daily.   hydrOXYzine  (VISTARIL ) 50 MG capsule 90 capsule 1    Sig: Take 1 capsule (50 mg total) by mouth every 8 (eight) hours as needed.   amLODipine  (NORVASC ) 5 MG tablet 90 tablet 0    Sig: Take 1 tablet (5 mg  total) by mouth daily.    Return in about 1 year (around 10/24/2024) for Physical per patient preference.  Greig JINNY Drones, NP

## 2023-10-25 NOTE — Progress Notes (Signed)
 Patient scored a 17 on the PHQ-9, medication refill, patient scored a 17 on the GAD-7

## 2023-10-26 ENCOUNTER — Ambulatory Visit: Payer: Self-pay | Admitting: Family

## 2023-10-26 DIAGNOSIS — Z1329 Encounter for screening for other suspected endocrine disorder: Secondary | ICD-10-CM

## 2023-10-26 DIAGNOSIS — Z13228 Encounter for screening for other metabolic disorders: Secondary | ICD-10-CM

## 2023-10-26 DIAGNOSIS — D72819 Decreased white blood cell count, unspecified: Secondary | ICD-10-CM

## 2023-10-26 LAB — CBC
Hematocrit: 41.5 % (ref 34.0–46.6)
Hemoglobin: 12.9 g/dL (ref 11.1–15.9)
MCH: 28.9 pg (ref 26.6–33.0)
MCHC: 31.1 g/dL — ABNORMAL LOW (ref 31.5–35.7)
MCV: 93 fL (ref 79–97)
Platelets: 288 x10E3/uL (ref 150–450)
RBC: 4.46 x10E6/uL (ref 3.77–5.28)
RDW: 13.1 % (ref 11.7–15.4)
WBC: 2.1 x10E3/uL — CL (ref 3.4–10.8)

## 2023-10-26 LAB — CMP14+EGFR
ALT: 43 IU/L — ABNORMAL HIGH (ref 0–32)
AST: 43 IU/L — ABNORMAL HIGH (ref 0–40)
Albumin: 4 g/dL (ref 3.9–4.9)
Alkaline Phosphatase: 93 IU/L (ref 41–116)
BUN/Creatinine Ratio: 20 (ref 9–23)
BUN: 12 mg/dL (ref 6–24)
Bilirubin Total: 0.4 mg/dL (ref 0.0–1.2)
CO2: 23 mmol/L (ref 20–29)
Calcium: 9.2 mg/dL (ref 8.7–10.2)
Chloride: 103 mmol/L (ref 96–106)
Creatinine, Ser: 0.6 mg/dL (ref 0.57–1.00)
Globulin, Total: 3.3 g/dL (ref 1.5–4.5)
Glucose: 93 mg/dL (ref 70–99)
Potassium: 4.7 mmol/L (ref 3.5–5.2)
Sodium: 140 mmol/L (ref 134–144)
Total Protein: 7.3 g/dL (ref 6.0–8.5)
eGFR: 110 mL/min/1.73 (ref >59)

## 2023-10-26 LAB — LIPID PANEL
Chol/HDL Ratio: 3.6 ratio (ref 0.0–4.4)
Cholesterol, Total: 129 mg/dL (ref 100–199)
HDL: 36 mg/dL — ABNORMAL LOW (ref >39)
LDL Chol Calc (NIH): 80 mg/dL (ref 0–99)
Triglycerides: 60 mg/dL (ref 0–149)
VLDL Cholesterol Cal: 13 mg/dL (ref 5–40)

## 2023-10-26 LAB — HEMOGLOBIN A1C
Est. average glucose Bld gHb Est-mCnc: 126 mg/dL
Hgb A1c MFr Bld: 6 % — ABNORMAL HIGH (ref 4.8–5.6)

## 2023-10-26 LAB — TSH: TSH: 0.386 u[IU]/mL — ABNORMAL LOW (ref 0.450–4.500)

## 2023-11-22 ENCOUNTER — Ambulatory Visit
Admission: RE | Admit: 2023-11-22 | Discharge: 2023-11-22 | Disposition: A | Discharge status: Home / Self Care | Source: Ambulatory Visit | Attending: Family | Admitting: Family

## 2023-11-22 DIAGNOSIS — Z1231 Encounter for screening mammogram for malignant neoplasm of breast: Secondary | ICD-10-CM

## 2023-12-03 ENCOUNTER — Emergency Department (HOSPITAL_BASED_OUTPATIENT_CLINIC_OR_DEPARTMENT_OTHER)
Admission: EM | Admit: 2023-12-03 | Discharge: 2023-12-03 | Disposition: A | Discharge status: Home / Self Care | Attending: Emergency Medicine | Admitting: Emergency Medicine

## 2023-12-03 ENCOUNTER — Other Ambulatory Visit: Payer: Self-pay

## 2023-12-03 DIAGNOSIS — K115 Sialolithiasis: Secondary | ICD-10-CM | POA: Diagnosis not present

## 2023-12-03 DIAGNOSIS — H6993 Unspecified Eustachian tube disorder, bilateral: Secondary | ICD-10-CM | POA: Diagnosis not present

## 2023-12-03 DIAGNOSIS — H9203 Otalgia, bilateral: Secondary | ICD-10-CM | POA: Diagnosis present

## 2023-12-03 DIAGNOSIS — R600 Localized edema: Secondary | ICD-10-CM

## 2023-12-03 MED ORDER — FLUTICASONE PROPIONATE 50 MCG/ACT NA SUSP: 2.0000 | Freq: Every day | NASAL | 0 refills | Status: AC

## 2023-12-03 MED ORDER — ACETAMINOPHEN 500 MG PO TABS
1000.0000 mg | ORAL_TABLET | Freq: Once | ORAL | Status: AC
  Administered 2023-12-03: 1000 mg via ORAL
  Filled 2023-12-03: qty 2

## 2023-12-03 NOTE — ED Triage Notes (Signed)
 Bilateral ear pain x 1 week and swelling in front of the left ear.

## 2023-12-03 NOTE — Discharge Instructions (Signed)
 Suck on lemon drops to take

## 2023-12-03 NOTE — ED Provider Notes (Signed)
[facility] Provider Note   CSN: [id number] Arrival date & time: 12/03/23  9393     Patient presents with: Ear Pain   Janice Terrell is a 48 y.o. female.   The history is provided by the patient.  Otalgia Location:  Bilateral Behind ear:  No abnormality Duration:  1 week Timing:  Constant Progression:  Unchanged Chronicity:  New Context: not direct blow   Relieved by:  Nothing Worsened by:  Nothing Ineffective treatments:  None tried Associated symptoms: no fever, no sore throat, no tinnitus and no vomiting   Associated symptoms comment:  Also has swelling of the left lower face this am       Prior to Admission medications   Medication Sig Start Date End Date Taking? Authorizing Provider  fluticasone  (FLONASE ) 50 MCG/ACT nasal spray Place 2 sprays into both nostrils daily. 12/03/23  Yes Jehan Bonano, MD  amLODipine  (NORVASC ) 5 MG tablet Take 1 tablet (5 mg total) by mouth daily. 10/25/23   Lorren Greig PARAS, NP  benzonatate  (TESSALON  PERLES) 100 MG capsule Take 1 capsule (100 mg total) by mouth 3 (three) times daily as needed. Patient not taking: Reported on 06/08/2023 05/14/23   Lorren Greig PARAS, NP  brompheniramine-pseudoephedrine-DM 30-2-10 MG/5ML syrup Take 5 mLs by mouth 4 (four) times daily as needed. Patient not taking: Reported on 06/08/2023 05/14/23   Lorren Greig PARAS, NP  cyclobenzaprine  (FLEXERIL ) 10 MG tablet Take 1 tablet (10 mg total) by mouth 2 (two) times daily as needed for muscle spasms. Patient not taking: Reported on 02/16/2023 06/23/22   Geiple, Joshua, PA-C  FLUoxetine  (PROZAC ) 20 MG capsule TAKE 1 CAPSULE BY MOUTH BY MOUTH ONCE DAILY Patient not taking: Reported on 10/25/2023 06/26/23   Lorren Greig PARAS, NP  FLUoxetine  (PROZAC ) 40 MG capsule Take 1 capsule (40 mg total) by mouth daily. 10/25/23 01/23/24  Lorren Greig PARAS, NP  gabapentin  (NEURONTIN ) 300 MG capsule Take 1 capsule (300 mg total) by mouth at  bedtime. Patient not taking: Reported on 10/25/2023 05/14/23   Lorren Greig PARAS, NP  hydrOXYzine  (VISTARIL ) 50 MG capsule Take 1 capsule (50 mg total) by mouth every 8 (eight) hours as needed. 10/25/23   Lorren Greig PARAS, NP  levocetirizine (XYZAL ) 2.5 MG/5ML solution Take 10 mLs (5 mg total) by mouth every evening. Patient not taking: Reported on 06/08/2023 05/14/23   Lorren Greig PARAS, NP  phentermine  15 MG capsule Take 1 capsule by mouth every morning. Patient not taking: Reported on 03/08/2023    [provider]  phentermine  37.5 MG capsule Take 1 capsule (37.5 mg total) by mouth every morning. Patient not taking: Reported on 10/25/2023 06/08/23   Lorren Greig PARAS, NP  Semaglutide -Weight Management 0.25 MG/0.5ML SOAJ Inject 0.25 mg into the skin once a week. Patient not taking: Reported on 03/08/2023 02/16/23   Lorren Greig PARAS, NP    Allergies: Patient has no known allergies.    Review of Systems  Constitutional:  Negative for fever.  HENT:  Positive for ear pain. Negative for sore throat and tinnitus.   Gastrointestinal:  Negative for vomiting.  All other systems reviewed and are negative.   Updated Vital Signs BP (!) 179/106 (BP Location: Right Arm)   Pulse 84   Temp 98.3 F (36.8 C) (Oral)   Resp 18   LMP 08/09/2014   SpO2 100%   Physical Exam Vitals and nursing note reviewed. Exam conducted with a chaperone present.  Constitutional:  General: She is not in acute distress.    Appearance: Normal appearance. She is well-developed.  HENT:     Head: Normocephalic and atraumatic.      Right Ear: Tympanic membrane, ear canal and external ear normal.     Left Ear: Tympanic membrane, ear canal and external ear normal.     Nose: Nose normal.  Eyes:     Pupils: Pupils are equal, round, and reactive to light.  Cardiovascular:     Rate and Rhythm: Normal rate and regular rhythm.     Pulses: Normal pulses.     Heart sounds: Normal heart sounds.  Pulmonary:     Effort: Pulmonary  effort is normal. No respiratory distress.     Breath sounds: Normal breath sounds.  Abdominal:     General: Bowel sounds are normal. There is no distension.     Palpations: Abdomen is soft.     Tenderness: There is no abdominal tenderness. There is no guarding or rebound.  Musculoskeletal:        General: Normal range of motion.     Cervical back: Neck supple.  Skin:    General: Skin is dry.     Capillary Refill: Capillary refill takes less than 2 seconds.     Findings: No erythema or rash.  Neurological:     General: No focal deficit present.     Mental Status: She is alert.     Deep Tendon Reflexes: Reflexes normal.  Psychiatric:        Mood and Affect: Mood normal.     (all labs ordered are listed, but only abnormal results are displayed) Labs Reviewed - No data to display  EKG: None  Radiology: No results found.   Procedures   Medications Ordered in the ED  acetaminophen  (TYLENOL ) tablet 1,000 mg (has no administration in time range)                                    Medical Decision Making 1 weeks of B ear pain.  No medications taken   Amount and/or Complexity of Data Reviewed External Data Reviewed: notes.    Details: Previous notes reviewed   Risk OTC drugs. Risk Details: Ear pain is due to eustachian tube dysfunction will start Flonase  and recommend tylenol  and ibuprofen .  Sialogogues for parotid gland swelling and follow up with ENT for ongoing care.  Referral provided.  Stable for discharge.       Final diagnoses:  Swelling of left parotid gland  Sialolithiasis  Dysfunction of both eustachian tubes   No signs of systemic illness or infection. The patient is nontoxic-appearing on exam and vital signs are within normal limits.  I have reviewed the triage vital signs and the nursing notes. Pertinent labs & imaging results that were available during my care of the patient were reviewed by me and considered in my medical decision making (see chart  for details). After history, exam, and medical workup I feel the patient has been appropriately medically screened and is safe for discharge home. Pertinent diagnoses were discussed with the patient. Patient was given return precautions.  ED Discharge Orders          Ordered    fluticasone  (FLONASE ) 50 MCG/ACT nasal spray  Daily        12/03/23 0620               Ediel Unangst, MD 12/03/23  0626  

## 2024-01-05 ENCOUNTER — Ambulatory Visit: Admission: EM | Admit: 2024-01-05 | Discharge: 2024-01-05 | Disposition: A | Discharge status: Home / Self Care

## 2024-01-05 ENCOUNTER — Ambulatory Visit (INDEPENDENT_AMBULATORY_CARE_PROVIDER_SITE_OTHER)

## 2024-01-05 DIAGNOSIS — R051 Acute cough: Secondary | ICD-10-CM | POA: Diagnosis not present

## 2024-01-05 DIAGNOSIS — J069 Acute upper respiratory infection, unspecified: Secondary | ICD-10-CM

## 2024-01-05 DIAGNOSIS — Z1211 Encounter for screening for malignant neoplasm of colon: Secondary | ICD-10-CM | POA: Insufficient documentation

## 2024-01-05 DIAGNOSIS — J029 Acute pharyngitis, unspecified: Secondary | ICD-10-CM

## 2024-01-05 DIAGNOSIS — E6609 Other obesity due to excess calories: Secondary | ICD-10-CM | POA: Insufficient documentation

## 2024-01-05 MED ORDER — PROMETHAZINE-DM 6.25-15 MG/5ML PO SYRP: 5.0000 mL | ORAL_SOLUTION | Freq: Three times a day (TID) | ORAL | 0 refills | Status: DC | PRN

## 2024-01-05 MED ORDER — PREDNISONE 20 MG PO TABS: 40.0000 mg | ORAL_TABLET | Freq: Every day | ORAL | 0 refills | Status: AC

## 2024-01-05 MED ORDER — AZITHROMYCIN 250 MG PO TABS: ORAL_TABLET | ORAL | 0 refills | Status: DC

## 2024-01-05 NOTE — ED Provider Notes (Signed)
 [facility]    CSN: [id number] Arrival date & time: 01/05/24  0802      History   Chief Complaint Chief Complaint  Patient presents with   Sore Throat   Chills    HPI Janice Terrell is a 48 y.o. female.   48 year old female who presents urgent care with complaints of cough and sore throat for 3 weeks.  She reports that it originally started with a mild sore throat which progressed into a cough.  Her symptoms have been worsening over the last 2 weeks.  She developed fevers and chills last night.  She does report some mild shortness of breath.  She denies any chest pain, nausea, vomiting, abdominal pain, headaches.  She is having bodyaches.  She has taken over-the-counter flu and cold medication without relief.   Sore Throat Associated symptoms include shortness of breath. Pertinent negatives include no chest pain and no abdominal pain.    Past Medical History:  Diagnosis Date   Arthritis    Hypertension    Morbid obesity (HCC) 04/27/2011   Obesity, Class III, BMI 40-49.9 (morbid obesity) (HCC) 04/27/2011    Patient Active Problem List   Diagnosis Date Noted   Other obesity due to excess calories 01/05/2024   Encounter for screening for malignant neoplasm of colon 01/05/2024   Pain of left hand 02/24/2022   Greater trochanteric pain syndrome of left lower extremity 06/15/2021   Degenerative tear of medial meniscus of right knee 06/15/2021   Pain in joint of right elbow 04/15/2021   Triceps tendinitis 04/15/2021   Bacterial vaginitis 09/28/2020   Candida vaginitis 09/28/2020   Prediabetes 09/27/2020   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/24/2020   Screening for malignant neoplasm of colon 09/24/2020   Abnormal uterine bleeding 08/06/2020   Abnormal vaginal bleeding 08/06/2020   Pain in pelvis 08/06/2020   Closed fracture of coronoid process of left ulna 07/20/2020   Closed fracture of head of left radius 07/20/2020   Primary osteoarthritis of left  knee 02/21/2019   Cyst of left ovary 08/28/2018   Irregular periods 11/17/2014   Obesity due to excess calories 04/27/2011    Past Surgical History:  Procedure Laterality Date   ABDOMINAL HYSTERECTOMY     BREATH TEK H PYLORI  05/19/2011   Procedure: BREATH TEK H PYLORI;  Surgeon: Donnice KATHEE Lunger, MD;  Location: [facility];  Service: General;  Laterality: N/A;   CESAREAN SECTION  1998   COLONOSCOPY WITH PROPOFOL  N/A 02/06/2020   Procedure: COLONOSCOPY WITH PROPOFOL ;  Surgeon: Rollin Dover, MD;  Location: [facility];  Service: Endoscopy;  Laterality: N/A;   CYSTOSCOPY  11/17/2014   Procedure: CYSTOSCOPY;  Surgeon: Ovid All, MD;  Location: [facility];  Service: Gynecology;;   LAPAROSCOPIC LYSIS OF ADHESIONS N/A 08/28/2018   Procedure: Laparoscopic Lysis Of Adhesions with repair of serosal tear of colon;  Surgeon: All Ovid, MD;  Location: [facility];  Service: Gynecology;  Laterality: N/A;   POLYPECTOMY  02/06/2020   Procedure: POLYPECTOMY;  Surgeon: Rollin Dover, MD;  Location: [facility];  Service: Endoscopy;;   TUBAL LIGATION      OB History   No obstetric history on file.      Home Medications    Prior to Admission medications   Medication Sig Start Date End Date Taking? Authorizing Provider  amLODipine  (NORVASC ) 5 MG tablet Take 1 tablet (5 mg total) by mouth daily. 10/25/23  Yes Massey, Amy J, NP  azithromycin  (ZITHROMAX ) 250 MG tablet Take first  2 tablets together, then 1 every day until finished. 01/05/24  Yes Kalyna Paolella A, PA-C  FLUoxetine  (PROZAC ) 40 MG capsule Take 1 capsule (40 mg total) by mouth daily. 10/25/23 01/23/24 Yes Massey, Amy J, NP  HYDROcodone -acetaminophen  (NORCO/VICODIN) 5-325 MG tablet Take 1 tablet by mouth every 4 (four) hours as needed. 07/10/23  Yes [provider]  hydrOXYzine  (VISTARIL ) 50 MG capsule Take 1 capsule (50 mg total) by mouth every 8 (eight) hours as needed. 10/25/23  Yes Massey, Amy J, NP  predniSONE  (DELTASONE ) 20 MG  tablet Take 2 tablets (40 mg total) by mouth daily with breakfast for 3 days. 01/05/24 01/08/24 Yes Lexton Hidalgo A, PA-C  promethazine -dextromethorphan (PROMETHAZINE -DM) 6.25-15 MG/5ML syrup Take 5 mLs by mouth every 8 (eight) hours as needed for cough. 01/05/24  Yes Tirsa Gail A, PA-C  benzonatate  (TESSALON  PERLES) 100 MG capsule Take 1 capsule (100 mg total) by mouth 3 (three) times daily as needed. Patient not taking: Reported on 06/08/2023 05/14/23   Jaycee Greig PARAS, NP  cyclobenzaprine  (FLEXERIL ) 10 MG tablet Take 1 tablet (10 mg total) by mouth 2 (two) times daily as needed for muscle spasms. Patient not taking: Reported on 02/16/2023 06/23/22   Geiple, Joshua, PA-C  FLUoxetine  (PROZAC ) 20 MG capsule TAKE 1 CAPSULE BY MOUTH BY MOUTH ONCE DAILY Patient not taking: No sig reported 06/26/23   Jaycee Greig PARAS, NP  fluticasone  (FLONASE ) 50 MCG/ACT nasal spray Place 2 sprays into both nostrils daily. 12/03/23   Palumbo, April, MD  gabapentin  (NEURONTIN ) 300 MG capsule Take 1 capsule (300 mg total) by mouth at bedtime. Patient not taking: Reported on 10/25/2023 05/14/23   Jaycee Greig PARAS, NP  levocetirizine (XYZAL ) 2.5 MG/5ML solution Take 10 mLs (5 mg total) by mouth every evening. Patient not taking: Reported on 06/08/2023 05/14/23   Jaycee Greig PARAS, NP  phentermine  15 MG capsule Take 1 capsule by mouth every morning. Patient not taking: Reported on 03/08/2023    [provider]  phentermine  37.5 MG capsule Take 1 capsule (37.5 mg total) by mouth every morning. Patient not taking: Reported on 10/25/2023 06/08/23   Jaycee Greig PARAS, NP  Semaglutide -Weight Management 0.25 MG/0.5ML SOAJ Inject 0.25 mg into the skin once a week. Patient not taking: Reported on 03/08/2023 02/16/23   Jaycee Greig PARAS, NP    Family History Family History  Problem Relation Age of Onset   Diabetes Sister    Diabetes Maternal Grandmother    Diabetes Other    Breast cancer Neg Hx     Social History Social History   Tobacco Use    Smoking status: Never    Passive exposure: Never   Smokeless tobacco: Never  Vaping Use   Vaping status: Never Used  Substance Use Topics   Alcohol use: Not Currently    Comment: occasionally   Drug use: Never     Allergies   Patient has no known allergies.   Review of Systems Review of Systems  Constitutional:  Positive for chills and fever.  HENT:  Positive for sore throat. Negative for ear pain.   Eyes:  Negative for pain and visual disturbance.  Respiratory:  Positive for cough and shortness of breath.   Cardiovascular:  Negative for chest pain and palpitations.  Gastrointestinal:  Negative for abdominal pain and vomiting.  Genitourinary:  Negative for dysuria and hematuria.  Musculoskeletal:  Negative for arthralgias and back pain.       Body aches  Skin:  Negative for color change  and rash.  Neurological:  Negative for seizures and syncope.  All other systems reviewed and are negative.    Physical Exam Triage Vital Signs ED Triage Vitals  Encounter Vitals Group     BP 01/05/24 0821 131/84     Girls Systolic BP Percentile --      Girls Diastolic BP Percentile --      Boys Systolic BP Percentile --      Boys Diastolic BP Percentile --      Pulse Rate 01/05/24 0821 86     Resp 01/05/24 0821 18     Temp 01/05/24 0821 98.7 F (37.1 C)     Temp Source 01/05/24 0821 Oral     SpO2 01/05/24 0821 98 %     Weight 01/05/24 0820 259 lb 4.2 oz (117.6 kg)     Height 01/05/24 0820 5' 2 (1.575 m)     Head Circumference --      Peak Flow --      Pain Score 01/05/24 0815 2     Pain Loc --      Pain Education --      Exclude from Growth Chart --    No data found.  Updated Vital Signs BP 131/84 (BP Location: Left Wrist)   Pulse 86   Temp 98.7 F (37.1 C) (Oral)   Resp 18   Ht 5' 2 (1.575 m)   Wt 259 lb 4.2 oz (117.6 kg)   LMP 08/09/2014   SpO2 98%   BMI 47.42 kg/m   Visual Acuity Right Eye Distance:   Left Eye Distance:   Bilateral Distance:    Right  Eye Near:   Left Eye Near:    Bilateral Near:     Physical Exam Vitals and nursing note reviewed.  Constitutional:      General: She is not in acute distress.    Appearance: She is well-developed.  HENT:     Head: Normocephalic and atraumatic.     Right Ear: Tympanic membrane normal.     Left Ear: Tympanic membrane normal.     Mouth/Throat:     Mouth: Mucous membranes are moist.     Pharynx: Posterior oropharyngeal erythema (Mild) and postnasal drip present.  Eyes:     Conjunctiva/sclera: Conjunctivae normal.  Cardiovascular:     Rate and Rhythm: Normal rate and regular rhythm.     Heart sounds: No murmur heard. Pulmonary:     Effort: Pulmonary effort is normal. No tachypnea or respiratory distress.     Breath sounds: Examination of the left-upper field reveals wheezing. Examination of the right-lower field reveals decreased breath sounds. Examination of the left-lower field reveals decreased breath sounds. Decreased breath sounds and wheezing present.  Abdominal:     Palpations: Abdomen is soft.     Tenderness: There is no abdominal tenderness.  Musculoskeletal:        General: No swelling.     Cervical back: Neck supple.  Skin:    General: Skin is warm and dry.     Capillary Refill: Capillary refill takes less than 2 seconds.  Neurological:     Mental Status: She is alert.  Psychiatric:        Mood and Affect: Mood normal.      UC Treatments / Results  Labs (all labs ordered are listed, but only abnormal results are displayed) Labs Reviewed - No data to display  EKG   Radiology No results found.  Procedures Procedures (including critical care time)  Medications Ordered in UC Medications - No data to display  Initial Impression / Assessment and Plan / UC Course  I have reviewed the triage vital signs and the nursing notes.  Pertinent labs & imaging results that were available during my care of the patient were reviewed by me and considered in my medical  decision making (see chart for details).     Acute upper respiratory infection  Acute cough - Plan: DG Chest 2 View, DG Chest 2 View  Sore throat   Chest x-ray done today.  Final evaluation by the radiologist is still pending but on brief evaluation there does not appear to be any acute or infectious process.  Symptoms and physical exam findings are most consistent with an upper respiratory infection.  Due to the severity and duration of symptoms we will treat with the following: Azithromycin  250mg  Take 2 tablets today and the 1 tablet daily for 4 more days. Prednisone  40 mg (2 tablets) once daily for 3 days. Take this in the morning.  This is a steroid to help with inflammation and pain. Promethazine  DM 5 mL every 8 hours as needed for cough.  Use caution as this medication can cause drowsiness. Make sure to stay hydrated by drinking plenty of water. Return to urgent care or PCP if symptoms worsen or fail to resolve.    Final Clinical Impressions(s) / UC Diagnoses   Final diagnoses:  Acute cough  Sore throat  Acute upper respiratory infection     Discharge Instructions      Chest x-ray done today.  Final evaluation by the radiologist is still pending but on brief evaluation there does not appear to be any acute or infectious process.  Symptoms and physical exam findings are most consistent with an upper respiratory infection.  Due to the severity and duration of symptoms we will treat with the following: Azithromycin  250mg  Take 2 tablets today and the 1 tablet daily for 4 more days. Prednisone  40 mg (2 tablets) once daily for 3 days. Take this in the morning.  This is a steroid to help with inflammation and pain. Promethazine  DM 5 mL every 8 hours as needed for cough.  Use caution as this medication can cause drowsiness. Make sure to stay hydrated by drinking plenty of water. Return to urgent care or PCP if symptoms worsen or fail to resolve.        ED Prescriptions      Medication Sig Dispense Auth. Provider   azithromycin  (ZITHROMAX ) 250 MG tablet Take first 2 tablets together, then 1 every day until finished. 6 tablet Teresa Norris A, PA-C   predniSONE  (DELTASONE ) 20 MG tablet Take 2 tablets (40 mg total) by mouth daily with breakfast for 3 days. 6 tablet Amarrah Meinhart A, PA-C   promethazine -dextromethorphan (PROMETHAZINE -DM) 6.25-15 MG/5ML syrup Take 5 mLs by mouth every 8 (eight) hours as needed for cough. 180 mL Teresa Norris LABOR, [place]      PDMP not reviewed this encounter.   Teresa Norris LABOR, [place] 01/05/24 [phone]

## 2024-01-05 NOTE — ED Triage Notes (Signed)
 Patient reports symptoms starting with sore throat about 3-4 wks ago & starting last night with chills/body aches. No visits during this time for the symptoms.

## 2024-01-05 NOTE — Discharge Instructions (Addendum)
 Chest x-ray done today.  Final evaluation by the radiologist is still pending but on brief evaluation there does not appear to be any acute or infectious process.  Symptoms and physical exam findings are most consistent with an upper respiratory infection.  Due to the severity and duration of symptoms we will treat with the following: Azithromycin  250mg  Take 2 tablets today and the 1 tablet daily for 4 more days. Prednisone  40 mg (2 tablets) once daily for 3 days. Take this in the morning.  This is a steroid to help with inflammation and pain. Promethazine  DM 5 mL every 8 hours as needed for cough.  Use caution as this medication can cause drowsiness. Make sure to stay hydrated by drinking plenty of water. Return to urgent care or PCP if symptoms worsen or fail to resolve.

## 2024-01-10 ENCOUNTER — Encounter: Payer: Self-pay | Admitting: Emergency Medicine

## 2024-01-10 ENCOUNTER — Ambulatory Visit
Admission: EM | Admit: 2024-01-10 | Discharge: 2024-01-10 | Disposition: A | Discharge status: Home / Self Care | Attending: Family Medicine | Admitting: Family Medicine

## 2024-01-10 DIAGNOSIS — K5730 Diverticulosis of large intestine without perforation or abscess without bleeding: Secondary | ICD-10-CM | POA: Insufficient documentation

## 2024-01-10 DIAGNOSIS — G43809 Other migraine, not intractable, without status migrainosus: Secondary | ICD-10-CM

## 2024-01-10 DIAGNOSIS — R6883 Chills (without fever): Secondary | ICD-10-CM

## 2024-01-10 DIAGNOSIS — R5383 Other fatigue: Secondary | ICD-10-CM | POA: Diagnosis not present

## 2024-01-10 DIAGNOSIS — R051 Acute cough: Secondary | ICD-10-CM

## 2024-01-10 LAB — POC COVID19/FLU A&B COMBO
Covid Antigen, POC: NEGATIVE
Influenza A Antigen, POC: NEGATIVE
Influenza B Antigen, POC: NEGATIVE

## 2024-01-10 MED ORDER — ONDANSETRON 4 MG PO TBDP: 4.0000 mg | ORAL_TABLET | Freq: Three times a day (TID) | ORAL | 0 refills | Status: AC | PRN

## 2024-01-10 MED ORDER — KETOROLAC TROMETHAMINE 15 MG/ML IJ SOLN
30.0000 mg | Freq: Once | INTRAMUSCULAR | Status: AC
  Administered 2024-01-10: 30 mg via INTRAMUSCULAR

## 2024-01-10 MED ORDER — ONDANSETRON 4 MG PO TBDP
4.0000 mg | ORAL_TABLET | Freq: Once | ORAL | Status: AC
  Administered 2024-01-10: 4 mg via ORAL

## 2024-01-10 MED ORDER — DEXAMETHASONE SOD PHOSPHATE PF 10 MG/ML IJ SOLN
10.0000 mg | Freq: Once | INTRAMUSCULAR | Status: AC
  Administered 2024-01-10: 10 mg via INTRAMUSCULAR

## 2024-01-10 MED ORDER — KETOROLAC TROMETHAMINE 30 MG/ML IJ SOLN: 30.0000 mg | Freq: Once | INTRAMUSCULAR | Status: DC

## 2024-01-10 NOTE — ED Provider Notes (Signed)
 [facility]    CSN: [id number] Arrival date & time: 01/10/24  9071      History   Chief Complaint Chief Complaint  Patient presents with   Headache   Chills   Emesis    HPI Janice Terrell is a 48 y.o. female.    Headache Associated symptoms: dizziness, fatigue, nausea and vomiting   Emesis Associated symptoms: chills and headaches    Patient is having a lot of migraine headaches. Headache started 3 days ago.  She then had vomiting twice last night.  She has had chills, night sweats the last several nights.  She is light headed when she gets up out of bed.  Her bp at work was 167/103, which prompted her to be sent home.   She does not have a formal dx of migraines, but states she has had them before.  Nothing more recent.  She does take norvasc  for HTN, she did take that this morning.  Her bp is looking better.  She was taking tylenol /motrin  for her headaches, but not really helping.  She states she has a headache now, 10/10.  Lots of pressure.  No light sensativity.   Seen last week for URI, given an abx, and that is improved.  Slight cough.       Past Medical History:  Diagnosis Date   Arthritis    Hypertension    Morbid obesity (HCC) 04/27/2011   Obesity, Class III, BMI 40-49.9 (morbid obesity) (HCC) 04/27/2011    Patient Active Problem List   Diagnosis Date Noted   Colon, diverticulosis 01/10/2024   Other obesity due to excess calories 01/05/2024   Encounter for screening for malignant neoplasm of colon 01/05/2024   Pain of left hand 02/24/2022   Greater trochanteric pain syndrome of left lower extremity 06/15/2021   Degenerative tear of medial meniscus of right knee 06/15/2021   Pain in joint of right elbow 04/15/2021   Triceps tendinitis 04/15/2021   Bacterial vaginitis 09/28/2020   Candida vaginitis 09/28/2020   Prediabetes 09/27/2020   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/24/2020   Screening for malignant neoplasm of colon  09/24/2020   Abnormal uterine bleeding 08/06/2020   Abnormal vaginal bleeding 08/06/2020   Pain in pelvis 08/06/2020   Closed fracture of coronoid process of left ulna 07/20/2020   Closed fracture of head of left radius 07/20/2020   Primary osteoarthritis of left knee 02/21/2019   Cyst of left ovary 08/28/2018   Irregular periods 11/17/2014   Obesity due to excess calories 04/27/2011    Past Surgical History:  Procedure Laterality Date   ABDOMINAL HYSTERECTOMY     BREATH TEK H PYLORI  05/19/2011   Procedure: BREATH TEK H PYLORI;  Surgeon: Donnice KATHEE Lunger, MD;  Location: [facility];  Service: General;  Laterality: N/A;   CESAREAN SECTION  1998   COLONOSCOPY WITH PROPOFOL  N/A 02/06/2020   Procedure: COLONOSCOPY WITH PROPOFOL ;  Surgeon: Rollin Dover, MD;  Location: [facility];  Service: Endoscopy;  Laterality: N/A;   CYSTOSCOPY  11/17/2014   Procedure: CYSTOSCOPY;  Surgeon: Ovid All, MD;  Location: [facility];  Service: Gynecology;;   LAPAROSCOPIC LYSIS OF ADHESIONS N/A 08/28/2018   Procedure: Laparoscopic Lysis Of Adhesions with repair of serosal tear of colon;  Surgeon: All Ovid, MD;  Location: [facility];  Service: Gynecology;  Laterality: N/A;   POLYPECTOMY  02/06/2020   Procedure: POLYPECTOMY;  Surgeon: Rollin Dover, MD;  Location: [facility];  Service: Endoscopy;;   TUBAL LIGATION  OB History   No obstetric history on file.      Home Medications    Prior to Admission medications  Medication Sig Start Date End Date Taking? Authorizing Provider  amLODipine  (NORVASC ) 5 MG tablet Take 1 tablet (5 mg total) by mouth daily. 10/25/23  Yes Massey, Amy J, NP  azithromycin  (ZITHROMAX ) 250 MG tablet Take first 2 tablets together, then 1 every day until finished. 01/05/24  Yes White, Elizabeth A, PA-C  FLUoxetine  (PROZAC ) 40 MG capsule Take 1 capsule (40 mg total) by mouth daily. 10/25/23 01/23/24 Yes Massey, Amy J, NP  hydrOXYzine  (VISTARIL ) 50 MG capsule Take 1 capsule (50 mg  total) by mouth every 8 (eight) hours as needed. 10/25/23  Yes Massey, Amy J, NP  promethazine -dextromethorphan (PROMETHAZINE -DM) 6.25-15 MG/5ML syrup Take 5 mLs by mouth every 8 (eight) hours as needed for cough. 01/05/24  Yes White, Elizabeth A, PA-C  benzonatate  (TESSALON  PERLES) 100 MG capsule Take 1 capsule (100 mg total) by mouth 3 (three) times daily as needed. Patient not taking: Reported on 01/10/2024 05/14/23   Jaycee Greig PARAS, NP  cyclobenzaprine  (FLEXERIL ) 10 MG tablet Take 1 tablet (10 mg total) by mouth 2 (two) times daily as needed for muscle spasms. Patient not taking: Reported on 02/16/2023 06/23/22   Desiderio Chew, PA-C  FLUoxetine  (PROZAC ) 20 MG capsule TAKE 1 CAPSULE BY MOUTH BY MOUTH ONCE DAILY Patient not taking: No sig reported 06/26/23   Jaycee Greig PARAS, NP  fluticasone  (FLONASE ) 50 MCG/ACT nasal spray Place 2 sprays into both nostrils daily. 12/03/23   Palumbo, April, MD  gabapentin  (NEURONTIN ) 300 MG capsule Take 1 capsule (300 mg total) by mouth at bedtime. Patient not taking: Reported on 10/25/2023 05/14/23   Jaycee Greig PARAS, NP  HYDROcodone -acetaminophen  (NORCO/VICODIN) 5-325 MG tablet Take 1 tablet by mouth every 4 (four) hours as needed. Patient not taking: Reported on 01/10/2024 07/10/23   [provider]  levocetirizine (XYZAL ) 2.5 MG/5ML solution Take 10 mLs (5 mg total) by mouth every evening. Patient not taking: Reported on 06/08/2023 05/14/23   Jaycee Greig PARAS, NP  phentermine  15 MG capsule Take 1 capsule by mouth every morning. Patient not taking: Reported on 03/08/2023    [provider]  phentermine  37.5 MG capsule Take 1 capsule (37.5 mg total) by mouth every morning. Patient not taking: Reported on 10/25/2023 06/08/23   Jaycee Greig PARAS, NP  Semaglutide -Weight Management 0.25 MG/0.5ML SOAJ Inject 0.25 mg into the skin once a week. Patient not taking: Reported on 03/08/2023 02/16/23   Jaycee Greig PARAS, NP    Family History Family History  Problem Relation Age of  Onset   Diabetes Sister    Diabetes Maternal Grandmother    Diabetes Other    Breast cancer Neg Hx     Social History Social History[1]   Allergies   Patient has no known allergies.   Review of Systems Review of Systems  Constitutional:  Positive for chills and fatigue.  HENT: Negative.    Respiratory: Negative.    Gastrointestinal:  Positive for nausea and vomiting.  Endocrine: Negative.   Musculoskeletal: Negative.   Neurological:  Positive for dizziness and headaches.  Psychiatric/Behavioral: Negative.       Physical Exam Triage Vital Signs ED Triage Vitals  Encounter Vitals Group     BP 01/10/24 0947 137/85     Girls Systolic BP Percentile --      Girls Diastolic BP Percentile --      Boys Systolic BP Percentile --  Boys Diastolic BP Percentile --      Pulse Rate 01/10/24 0947 80     Resp 01/10/24 0947 16     Temp 01/10/24 0947 98.8 F (37.1 C)     Temp Source 01/10/24 0947 Oral     SpO2 01/10/24 0947 96 %     Weight --      Height --      Head Circumference --      Peak Flow --      Pain Score 01/10/24 0943 10     Pain Loc --      Pain Education --      Exclude from Growth Chart --    No data found.  Updated Vital Signs BP 137/85 (BP Location: Left Arm)   Pulse 80   Temp 98.8 F (37.1 C) (Oral)   Resp 16   LMP 08/09/2014   SpO2 96%   Visual Acuity Right Eye Distance:   Left Eye Distance:   Bilateral Distance:    Right Eye Near:   Left Eye Near:    Bilateral Near:     Physical Exam Constitutional:      General: She is not in acute distress.    Appearance: She is well-developed and normal weight. She is not ill-appearing or toxic-appearing.  HENT:     Head: Normocephalic.     Mouth/Throat:     Mouth: Mucous membranes are moist.  Eyes:     Extraocular Movements:     Right eye: Nystagmus present.     Left eye: Nystagmus present.  Cardiovascular:     Rate and Rhythm: Normal rate and regular rhythm.  Pulmonary:     Effort:  Pulmonary effort is normal.     Breath sounds: Normal breath sounds.  Musculoskeletal:        General: Normal range of motion.     Cervical back: Normal range of motion and neck supple.  Lymphadenopathy:     Cervical: No cervical adenopathy.  Skin:    General: Skin is warm.  Neurological:     Mental Status: She is alert and oriented to person, place, and time.     Cranial Nerves: No cranial nerve deficit, dysarthria or facial asymmetry.     Sensory: No sensory deficit.     Motor: No weakness.  Psychiatric:        Mood and Affect: Mood normal.        Behavior: Behavior normal.      UC Treatments / Results  Labs (all labs ordered are listed, but only abnormal results are displayed) Labs Reviewed  POC COVID19/FLU A&B COMBO    EKG   Radiology No results found.  Procedures Procedures (including critical care time)  Medications Ordered in UC Medications  dexamethasone  (DECADRON ) injection 10 mg (10 mg Intramuscular Given 01/10/24 1018)  ondansetron  (ZOFRAN -ODT) disintegrating tablet 4 mg (4 mg Oral Given 01/10/24 1015)  ketorolac  (TORADOL ) 15 MG/ML injection 30 mg (30 mg Intramuscular Given 01/10/24 1018)    Initial Impression / Assessment and Plan / UC Course  I have reviewed the triage vital signs and the nursing notes.  Pertinent labs & imaging results that were available during my care of the patient were reviewed by me and considered in my medical decision making (see chart for details).   Final Clinical Impressions(s) / UC Diagnoses   Final diagnoses:  Acute cough  Chills  Other fatigue  Other migraine without status migrainosus, not intractable     Discharge Instructions  You were seen today for headache, nausea, fatigue.  Your flu/covid swab was negative today.  I have given you medications today for your headache and nausea.  I have sent more zofran  to your pharmacy to help with nausea if needed.  I recommend you trial over the counter excedrin  migraine for your headaches.  If you continue with headache despite medications, then you should go to the ER for further evaluation.  You may wish to follow up with your primary care provider as well for discussion.     ED Prescriptions     Medication Sig Dispense Auth. Provider   ondansetron  (ZOFRAN -ODT) 4 MG disintegrating tablet Take 1 tablet (4 mg total) by mouth every 8 (eight) hours as needed for nausea or vomiting. 20 tablet Darral Longs, MD      PDMP not reviewed this encounter.     [1]  Social History Tobacco Use   Smoking status: Never    Passive exposure: Never   Smokeless tobacco: Never  Vaping Use   Vaping status: Never Used  Substance Use Topics   Alcohol use: Not Currently    Comment: occasionally   Drug use: Never     Darral Longs, MD 01/10/24 1040

## 2024-01-10 NOTE — Discharge Instructions (Signed)
 You were seen today for headache, nausea, fatigue.  Your flu/covid swab was negative today.  I have given you medications today for your headache and nausea.  I have sent more zofran  to your pharmacy to help with nausea if needed.  I recommend you trial over the counter excedrin migraine for your headaches.  If you continue with headache despite medications, then you should go to the ER for further evaluation.  You may wish to follow up with your primary care provider as well for discussion.

## 2024-01-10 NOTE — ED Triage Notes (Addendum)
 Pt reports headache and chills x4 days. Pt reports 2 vomiting episodes yesterday, but notes no ongoing nausea. Pt previously had cough and sore throat about 1 week ago that has since resolved. Ibuprofen , tylenol , and aspirin taken with no relief of symptoms. Finished azithromycin  yesterday and promethazine -dm cough syrup from previous illness.

## 2024-01-25 ENCOUNTER — Ambulatory Visit: Payer: Self-pay

## 2024-01-25 ENCOUNTER — Other Ambulatory Visit: Payer: Self-pay | Admitting: Family

## 2024-01-25 DIAGNOSIS — I10 Essential (primary) hypertension: Secondary | ICD-10-CM

## 2024-01-25 NOTE — Telephone Encounter (Signed)
 Message from Arnegard E sent at 01/25/2024  9:04 AM EST  Summary: Seeking Rx, symptomatic   Reason for Triage: Pt is symptomatic and is asking for a refill of promethazine -dextromethorphan (PROMETHAZINE -DM) 6.25-15 MG/5ML syrup  Says her symptoms have not improved since finishing this medication         1st attempt to contact patient. No answer-voicemail left for patient to call back to Nurse Triage.

## 2024-01-25 NOTE — Telephone Encounter (Signed)
 FYI Only or Action Required?: Action required by provider: clinical question for provider and update on patient condition.  Patient was last seen in primary care on 10/25/2023 by Jaycee Greig PARAS, NP.  Called Nurse Triage reporting Cough.  Symptoms began several weeks ago.  Interventions attempted: Rest, hydration, or home remedies.  Symptoms are: unchanged.  Triage Disposition: See Physician Within 24 Hours  Patient/caregiver understands and will follow disposition?: No, wishes to speak with PCP Summary: Seeking Rx, symptomatic   Reason for Triage: Pt is symptomatic and is asking for a refill of promethazine -dextromethorphan (PROMETHAZINE -DM) 6.25-15 MG/5ML syrup  Says her symptoms have not improved since finishing this medication            Reason for Disposition  SEVERE coughing spells (e.g., whooping sound after coughing, vomiting after coughing)  Answer Assessment - Initial Assessment Questions Patient calling to request cough medication. Patient was prescribed the promethazine -DM at urgent care a few weeks ago. Asking for a refill. Patient's blood pressure medication refill was sent as well. Patient is wanting to be started on Wegovy  and asking to speak to provider.   1. ONSET: When did the cough begin?      Started two weeks ago 2. SEVERITY: How bad is the cough today?      Rates 6 out of 10 3. SPUTUM: Describe the color of your sputum (e.g., none, dry cough; clear, white, yellow, green)     clear 4. HEMOPTYSIS: Are you coughing up any blood? If Yes, ask: How much? (e.g., flecks, streaks, tablespoons, etc.)     no 5. DIFFICULTY BREATHING: Are you having difficulty breathing? If Yes, ask: How bad is it? (e.g., mild, moderate, severe)      no 6. FEVER: Do you have a fever? If Yes, ask: What is your temperature, how was it measured, and when did it start?     no 7. CARDIAC HISTORY: Do you have any history of heart disease? (e.g., heart attack, congestive  heart failure)      no 8. LUNG HISTORY: Do you have any history of lung disease?  (e.g., pulmonary embolus, asthma, emphysema)     no 9. PE RISK FACTORS: Do you have a history of blood clots? (or: recent major surgery, recent prolonged travel, bedridden)     no 10. OTHER SYMPTOMS: Do you have any other symptoms? (e.g., runny nose, wheezing, chest pain)       Runny nose 12. TRAVEL: Have you traveled out of the country in the last month? (e.g., travel history, exposures)       no  Protocols used: Cough - Acute Productive-A-AH

## 2024-01-25 NOTE — Telephone Encounter (Signed)
 Copied from CRM [id number]. Topic: Clinical - Medication Refill >> Jan 25, 2024  9:00 AM Rosaria E wrote: Medication:  amLODipine  (NORVASC ) 5 MG tablet  Has the patient contacted their pharmacy? Yes (Agent: If no, request that the patient contact the pharmacy for the refill. If patient does not wish to contact the pharmacy document the reason why and proceed with request.) (Agent: If yes, when and what did the pharmacy advise?)  This is the patient's preferred pharmacy:  [facility] [address] ([address] [place] - [address] [place]) [place] [address] Phone: [phone] Fax: [phone]  Is this the correct pharmacy for this prescription? Yes If no, delete pharmacy and type the correct one.   Has the prescription been filled recently? Yes  Is the patient out of the medication? Yes  Has the patient been seen for an appointment in the last year OR does the patient have an upcoming appointment? Yes  Can we respond through MyChart? Yes  Agent: Please be advised that Rx refills may take up to 3 business days. We ask that you follow-up with your pharmacy.

## 2024-01-28 ENCOUNTER — Other Ambulatory Visit: Payer: Self-pay | Admitting: Family

## 2024-01-28 ENCOUNTER — Telehealth: Payer: Self-pay

## 2024-01-28 DIAGNOSIS — R059 Cough, unspecified: Secondary | ICD-10-CM

## 2024-01-28 MED ORDER — AMLODIPINE BESYLATE 5 MG PO TABS: 5.0000 mg | ORAL_TABLET | Freq: Every day | ORAL | 0 refills | Status: AC

## 2024-01-28 MED ORDER — PROMETHAZINE-DM 6.25-15 MG/5ML PO SYRP: 5.0000 mL | ORAL_SOLUTION | Freq: Three times a day (TID) | ORAL | 0 refills | Status: AC | PRN

## 2024-01-28 NOTE — Telephone Encounter (Signed)
 Attempt to reach out to patietn calling number listed in chart. It ask for the name of the caller but the call is disconnected.

## 2024-01-28 NOTE — Telephone Encounter (Signed)
 Requested Prescriptions  Pending Prescriptions Disp Refills   amLODipine  (NORVASC ) 5 MG tablet 90 tablet 0    Sig: Take 1 tablet (5 mg total) by mouth daily.     Cardiovascular: Calcium Channel Blockers 2 Passed - 01/28/2024 12:41 PM      Passed - Last BP in normal range    BP Readings from Last 1 Encounters:  01/10/24 137/85         Passed - Last Heart Rate in normal range    Pulse Readings from Last 1 Encounters:  01/10/24 80         Passed - Valid encounter within last 6 months    Recent Outpatient Visits           3 months ago Annual physical exam   [facility], Amy J, NP   5 months ago Encounter for weight management   [facility], Amy J, NP   7 months ago Encounter for weight management   [facility], Amy J, NP   8 months ago Primary hypertension   [facility], Amy J, NP   9 months ago Encounter for weight management   [facility], Greig PARAS, NP

## 2024-01-28 NOTE — Telephone Encounter (Signed)
 Complete. During the interim report to the Emergency Department/Urgent Care/call 911 for immediate medical evaluation. Follow-up with Primary Care.

## 2024-01-28 NOTE — Telephone Encounter (Signed)
 Copied from CRM [id number]. Topic: Clinical - Prescription Issue >> Jan 25, 2024  9:02 AM Rosaria BRAVO wrote: Reason for CRM: Pt states that she needs a prior authorization to receive her Wegovy , wants to discuss this with the clinic.   Best contact: [phone]

## 2024-01-29 ENCOUNTER — Other Ambulatory Visit: Payer: Self-pay

## 2024-01-30 NOTE — Telephone Encounter (Signed)
 Does patient want to restart medication?  Please advise.

## 2024-02-07 NOTE — Telephone Encounter (Signed)
 I called patient  to see if she wants to restart medication no one answered so I left a voicemail to return my call.

## 2024-02-07 NOTE — Telephone Encounter (Signed)
 I called patient and no one answered so I left a voicemail to return my call
